# Patient Record
Sex: Male | Born: 1950 | Hispanic: Refuse to answer | Marital: Married | Smoking: Former smoker
Health system: Southern US, Community
[De-identification: ages and names within clinical notes are randomized; demographics above are authoritative.]

## PROBLEM LIST (undated history)

## (undated) DIAGNOSIS — E119 Type 2 diabetes mellitus without complications: Secondary | ICD-10-CM

## (undated) DIAGNOSIS — I10 Essential (primary) hypertension: Secondary | ICD-10-CM

## (undated) DIAGNOSIS — E785 Hyperlipidemia, unspecified: Secondary | ICD-10-CM

## (undated) DIAGNOSIS — M722 Plantar fascial fibromatosis: Secondary | ICD-10-CM

## (undated) HISTORY — DX: Plantar fascial fibromatosis: M72.2

## (undated) HISTORY — DX: Essential (primary) hypertension: I10

## (undated) HISTORY — DX: Hyperlipidemia, unspecified: E78.5

## (undated) HISTORY — DX: Type 2 diabetes mellitus without complications: E11.9

---

## 1995-08-11 HISTORY — PX: OTHER SURGICAL HISTORY: SHX169

## 1999-09-08 ENCOUNTER — Ambulatory Visit (HOSPITAL_BASED_OUTPATIENT_CLINIC_OR_DEPARTMENT_OTHER): Admission: RE | Admit: 1999-09-08 | Discharge: 1999-09-08 | Payer: Self-pay | Admitting: Surgery

## 2000-07-27 ENCOUNTER — Encounter: Admission: RE | Admit: 2000-07-27 | Discharge: 2000-08-09 | Payer: Self-pay | Admitting: Family Medicine

## 2000-08-10 HISTORY — PX: KNEE ARTHROSCOPY: SHX127

## 2000-08-20 ENCOUNTER — Encounter: Payer: Self-pay | Admitting: Family Medicine

## 2000-08-20 ENCOUNTER — Encounter: Admission: RE | Admit: 2000-08-20 | Discharge: 2000-08-20 | Payer: Self-pay | Admitting: Family Medicine

## 2001-09-21 ENCOUNTER — Ambulatory Visit (HOSPITAL_BASED_OUTPATIENT_CLINIC_OR_DEPARTMENT_OTHER): Admission: RE | Admit: 2001-09-21 | Discharge: 2001-09-21 | Payer: Self-pay | Admitting: Orthopedic Surgery

## 2002-08-10 HISTORY — PX: CARPAL TUNNEL RELEASE: SHX101

## 2003-11-06 ENCOUNTER — Encounter: Admission: RE | Admit: 2003-11-06 | Discharge: 2003-11-06 | Payer: Self-pay | Admitting: Gastroenterology

## 2003-11-13 ENCOUNTER — Encounter (INDEPENDENT_AMBULATORY_CARE_PROVIDER_SITE_OTHER): Payer: Self-pay | Admitting: Specialist

## 2003-11-13 ENCOUNTER — Ambulatory Visit (HOSPITAL_COMMUNITY): Admission: RE | Admit: 2003-11-13 | Discharge: 2003-11-13 | Payer: Self-pay | Admitting: Gastroenterology

## 2003-12-20 ENCOUNTER — Encounter: Admission: RE | Admit: 2003-12-20 | Discharge: 2003-12-20 | Payer: Self-pay | Admitting: Gastroenterology

## 2004-08-10 HISTORY — PX: BACK SURGERY: SHX140

## 2005-05-13 ENCOUNTER — Ambulatory Visit (HOSPITAL_COMMUNITY): Admission: RE | Admit: 2005-05-13 | Discharge: 2005-05-14 | Payer: Self-pay | Admitting: Specialist

## 2007-06-06 ENCOUNTER — Encounter: Admission: RE | Admit: 2007-06-06 | Discharge: 2007-06-06 | Payer: Self-pay | Admitting: Family Medicine

## 2008-06-05 ENCOUNTER — Encounter (INDEPENDENT_AMBULATORY_CARE_PROVIDER_SITE_OTHER): Payer: Self-pay | Admitting: *Deleted

## 2008-08-10 LAB — HM COLONOSCOPY: HM Colonoscopy: NORMAL

## 2009-06-20 ENCOUNTER — Ambulatory Visit: Payer: Self-pay | Admitting: Family Medicine

## 2009-06-20 DIAGNOSIS — M722 Plantar fascial fibromatosis: Secondary | ICD-10-CM

## 2009-06-20 DIAGNOSIS — I10 Essential (primary) hypertension: Secondary | ICD-10-CM

## 2009-06-20 HISTORY — DX: Essential (primary) hypertension: I10

## 2009-06-20 HISTORY — DX: Plantar fascial fibromatosis: M72.2

## 2010-04-08 ENCOUNTER — Encounter: Payer: Self-pay | Admitting: Family Medicine

## 2010-07-22 ENCOUNTER — Telehealth: Payer: Self-pay | Admitting: Family Medicine

## 2010-07-28 ENCOUNTER — Ambulatory Visit: Payer: Self-pay | Admitting: Family Medicine

## 2010-07-28 LAB — CONVERTED CEMR LAB
ALT: 16 units/L (ref 0–53)
AST: 18 units/L (ref 0–37)
Albumin: 3.8 g/dL (ref 3.5–5.2)
Alkaline Phosphatase: 64 units/L (ref 39–117)
BUN: 11 mg/dL (ref 6–23)
Basophils Absolute: 0.1 10*3/uL (ref 0.0–0.1)
Basophils Relative: 0.6 % (ref 0.0–3.0)
Bilirubin Urine: NEGATIVE
Bilirubin, Direct: 0.1 mg/dL (ref 0.0–0.3)
CO2: 28 meq/L (ref 19–32)
Calcium: 9.5 mg/dL (ref 8.4–10.5)
Chloride: 104 meq/L (ref 96–112)
Cholesterol: 133 mg/dL (ref 0–200)
Creatinine, Ser: 0.9 mg/dL (ref 0.4–1.5)
Eosinophils Absolute: 0.2 10*3/uL (ref 0.0–0.7)
Eosinophils Relative: 2.5 % (ref 0.0–5.0)
GFR calc non Af Amer: 93.28 mL/min (ref >60.00)
Glucose, Bld: 119 mg/dL — ABNORMAL HIGH (ref 70–99)
HCT: 45.8 % (ref 39.0–52.0)
HDL: 33.3 mg/dL — ABNORMAL LOW (ref >39.00)
Hemoglobin: 15.4 g/dL (ref 13.0–17.0)
Ketones, ur: NEGATIVE mg/dL
LDL Cholesterol: 71 mg/dL (ref 0–99)
Leukocytes, UA: NEGATIVE
Lymphocytes Relative: 24.4 % (ref 12.0–46.0)
Lymphs Abs: 2.3 10*3/uL (ref 0.7–4.0)
MCHC: 33.6 g/dL (ref 30.0–36.0)
MCV: 82 fL (ref 78.0–100.0)
Monocytes Absolute: 1 10*3/uL (ref 0.1–1.0)
Monocytes Relative: 10.3 % (ref 3.0–12.0)
Neutro Abs: 5.8 10*3/uL (ref 1.4–7.7)
Neutrophils Relative %: 62.2 % (ref 43.0–77.0)
Nitrite: NEGATIVE
PSA: 0.39 ng/mL (ref 0.10–4.00)
Platelets: 346 10*3/uL (ref 150.0–400.0)
Potassium: 5.2 meq/L — ABNORMAL HIGH (ref 3.5–5.1)
RBC: 5.59 M/uL (ref 4.22–5.81)
RDW: 14.6 % (ref 11.5–14.6)
Sodium: 140 meq/L (ref 135–145)
Specific Gravity, Urine: <=1.005 (ref 1.000–1.030)
TSH: 1.17 microintl units/mL (ref 0.35–5.50)
Total Bilirubin: 1 mg/dL (ref 0.3–1.2)
Total CHOL/HDL Ratio: 4
Total Protein, Urine: NEGATIVE mg/dL
Total Protein: 7.1 g/dL (ref 6.0–8.3)
Triglycerides: 146 mg/dL (ref 0.0–149.0)
Urine Glucose: NEGATIVE mg/dL
Urobilinogen, UA: 0.2 (ref 0.0–1.0)
VLDL: 29.2 mg/dL (ref 0.0–40.0)
WBC: 9.4 10*3/uL (ref 4.5–10.5)
pH: 6.5 (ref 5.0–8.0)

## 2010-08-10 LAB — HM DIABETES FOOT EXAM

## 2010-08-10 LAB — HM DIABETES EYE EXAM

## 2010-08-13 ENCOUNTER — Ambulatory Visit
Admission: RE | Admit: 2010-08-13 | Discharge: 2010-08-13 | Payer: Self-pay | Source: Home / Self Care | Attending: Family Medicine | Admitting: Family Medicine

## 2010-08-13 DIAGNOSIS — E119 Type 2 diabetes mellitus without complications: Secondary | ICD-10-CM | POA: Insufficient documentation

## 2010-08-13 DIAGNOSIS — E785 Hyperlipidemia, unspecified: Secondary | ICD-10-CM | POA: Insufficient documentation

## 2010-08-13 DIAGNOSIS — D485 Neoplasm of uncertain behavior of skin: Secondary | ICD-10-CM | POA: Insufficient documentation

## 2010-08-26 ENCOUNTER — Encounter: Payer: Self-pay | Admitting: Family Medicine

## 2010-08-26 DIAGNOSIS — E785 Hyperlipidemia, unspecified: Secondary | ICD-10-CM | POA: Insufficient documentation

## 2010-08-26 DIAGNOSIS — E119 Type 2 diabetes mellitus without complications: Secondary | ICD-10-CM | POA: Insufficient documentation

## 2010-09-06 ENCOUNTER — Encounter: Payer: Self-pay | Admitting: Family Medicine

## 2010-09-11 ENCOUNTER — Ambulatory Visit (INDEPENDENT_AMBULATORY_CARE_PROVIDER_SITE_OTHER): Payer: 59 | Admitting: Family Medicine

## 2010-09-11 ENCOUNTER — Encounter: Payer: Self-pay | Admitting: Family Medicine

## 2010-09-11 ENCOUNTER — Ambulatory Visit: Admit: 2010-09-11 | Payer: Self-pay | Admitting: Family Medicine

## 2010-09-11 VITALS — BP 126/74 | HR 77 | Temp 98.3°F | Ht 73.0 in | Wt 253.0 lb

## 2010-09-11 DIAGNOSIS — E119 Type 2 diabetes mellitus without complications: Secondary | ICD-10-CM

## 2010-09-11 DIAGNOSIS — D239 Other benign neoplasm of skin, unspecified: Secondary | ICD-10-CM

## 2010-09-11 DIAGNOSIS — D229 Melanocytic nevi, unspecified: Secondary | ICD-10-CM

## 2010-09-11 NOTE — Progress Notes (Signed)
Summary: Avandia no longer available ar retail pharmacies  Phone Note From Pharmacy   [facility]  [address] Call For: Christopher Navarro  Summary of Call: Fax from pharmacy "Avandia is no longer available at retail pharmacies.  Patient asking about switching or making other arrangements". Initial call taken by: Sid Falcon LPN,  July 22, 2010 3:03 PM  Follow-up for Phone Call        Change to Actos 15 mg one by mouth once daily Refill OK to 2 months but pt is overdue for follow up  Follow-up by: Evelena Peat MD,  July 22, 2010 7:25 PM  Additional Follow-up for Phone Call Additional follow up Details #1::        Rx sent, pt informed needs return OV Additional Follow-up by: Sid Falcon LPN,  July 23, 2010 12:13 PM    New/Updated Medications: ACTOS 15 MG TABS (PIOGLITAZONE HCL) once daily Prescriptions: ACTOS 15 MG TABS (PIOGLITAZONE HCL) once daily  #30 x 2   Entered by:   Sid Falcon LPN   Authorized by:   Evelena Peat MD   Signed by:   Sid Falcon LPN on 16/05/9603   Method used:   [facility]        [facility]  [address] [facility]       [address]       [place], [place]  [address]       Ph: [phone] or [phone]       Fax: [phone]   RxID:   [id number]

## 2010-09-11 NOTE — Patient Instructions (Signed)
Keep wound dry for the first 24 hours then clean daily with soap and water for one week. Apply topical antibiotic daily for 3-4 days. Keep covered with clean dressing for 4-5 days. Follow up promptly for any signs of infection such as redness, warmth, pain, or drainage.  

## 2010-09-11 NOTE — Progress Notes (Addendum)
Pt here for atypical mole excision upper back.  No recent itching or bleeding. This was noted on recent exam.  Pt does not have personal hx of skin cancer But brother did have what sounds like severe dysplasia vs melanoma.  Pt also has hx of diabetes and needs A1C today.  This was overlooked and not Done on recent labs from CPE.  PMH, SH, AND FH reviewed.  ROS  No urine frequency or increased thirst.  PE  Pt alert and no apparent distress. Chest- clear to auscultation. Heart RRR without murmur. Skin-  Mid upper thoracic back  Nevus 6 mm diameter with mostly brown color with some Mild asymmetry and slightly irreg border.  Assessment. Atypical nevus mid upper back Plan.  Discussed risk and benefits of shave excision of nevus with patient. Patient consented. PET scan of alcohol. Anesthesia 1% Xylocaine with epinephrine.           Shave biopsy with #15 blade with minimal bleeding. Contrast with Drysol. Antibiotic and dressing applied. Wound care instruction given. Specimen sent to pathology for further evaluation

## 2010-09-11 NOTE — Letter (Signed)
Summary: [facility]  [facility]   Imported By: Sherian Rein 04/16/2010 10:40:35  _____________________________________________________________________  External Attachment:    Type:   Image     Comment:   External Document

## 2010-09-11 NOTE — Assessment & Plan Note (Signed)
Summary: cpx/cjr   Vital Signs:  Patient profile:   60 year old male Height:      73 inches Weight:      252 pounds BMI:     33.37 Temp:     98.3 degrees F oral Pulse rate:   88 / minute Pulse rhythm:   regular Resp:     12 per minute BP sitting:   120 / 82  (left arm) Cuff size:   large  Vitals Entered By: Sid Falcon LPN (August 13, 2010 11:44 AM)  Nutrition Counseling: Patient's BMI is greater than 25 and therefore counseled on weight management options.  History of Present Illness: Here for CPE.  Has just recently started exercising. Weight gain in recent years. PMH, SH, and FH reviewed.  Diabetes not checking frequently.  No symptoms of hyperglycemia. A1C not done with recent labs.     Diabetes Management History:      He has not been enrolled in the "Diabetic Education Program".  He states understanding of dietary principles but he is not following the appropriate diet.  No sensory loss is reported.  Self foot exams are being performed.  He is not checking home blood sugars.  He says that he is exercising.        Hypoglycemic symptoms are not occurring.  No hyperglycemic symptoms are reported.        There are no symptoms to suggest diabetic complications.  No changes have been made to his treatment plan since last visit.    Hypertension History:      He denies headache, chest pain, palpitations, dyspnea with exertion, orthopnea, PND, peripheral edema, visual symptoms, neurologic problems, and syncope.  He notes no problems with any antihypertensive medication side effects.        Positive major cardiovascular risk factors include male age 24 years old or older, diabetes, hyperlipidemia, and hypertension.     Preventive Screening-Counseling & Management  Caffeine-Diet-Exercise     Does Patient Exercise: yes  Clinical Review Panels:  Prevention   Last Colonoscopy:  normal (08/10/2008)   Last PSA:  0.39 (07/28/2010)  Immunizations   Last Tetanus Booster:   Historical (06/10/2010)   Last Flu Vaccine:  Historical (06/10/2010)  Lipid Management   Cholesterol:  133 (07/28/2010)   LDL (bad choesterol):  71 (07/28/2010)   HDL (good cholesterol):  33.30 (07/28/2010)  Diabetes Management   Creatinine:  0.9 (07/28/2010)   Last Dilated Eye Exam:  normal (08/13/2010)   Last Foot Exam:  yes (08/13/2010)   Last Flu Vaccine:  Historical (06/10/2010)  CBC   WBC:  9.4 (07/28/2010)   RBC:  5.59 (07/28/2010)   Hgb:  15.4 (07/28/2010)   Hct:  45.8 (07/28/2010)   Platelets:  346.0 (07/28/2010)   MCV  82.0 (07/28/2010)   MCHC  33.6 (07/28/2010)   RDW  14.6 (07/28/2010)   PMN:  62.2 (07/28/2010)   Lymphs:  24.4 (07/28/2010)   Monos:  10.3 (07/28/2010)   Eosinophils:  2.5 (07/28/2010)   Basophil:  0.6 (07/28/2010)  Complete Metabolic Panel   Glucose:  119 (07/28/2010)   Sodium:  140 (07/28/2010)   Potassium:  5.2 (07/28/2010)   Chloride:  104 (07/28/2010)   CO2:  28 (07/28/2010)   BUN:  11 (07/28/2010)   Creatinine:  0.9 (07/28/2010)   Albumin:  3.8 (07/28/2010)   Total Protein:  7.1 (07/28/2010)   Calcium:  9.5 (07/28/2010)   Total Bili:  1.0 (07/28/2010)   Alk Phos:  64 (07/28/2010)  SGPT (ALT):  16 (07/28/2010)   SGOT (AST):  18 (07/28/2010)   Allergies (verified): No Known Drug Allergies  Past History:  Past Medical History: Last updated: 06/20/2009 Diabetes Hypertension  Past Surgical History: Last updated: 06/20/2009 Knee scope 2002 Bilateral CTS 2004 Ablasion (WPW) 1997 Back 2006  Family History: Last updated: 08/13/2010 Family History of Arthritis Family History High cholesterol Family History Hypertension  mother Family History of Stroke mother 55  Social History: Last updated: 06/20/2009 Retired  Lexicographer Married Alcohol use-no Past smoker, quit PMH-FH-SH reviewed for relevance  Family History: Family History of Arthritis Family History High cholesterol Family History Hypertension   mother Family History of Stroke mother 22  Social History: Does Patient Exercise:  yes  Review of Systems  The patient denies anorexia, fever, weight loss, vision loss, decreased hearing, hoarseness, chest pain, syncope, dyspnea on exertion, peripheral edema, prolonged cough, headaches, hemoptysis, abdominal pain, melena, hematochezia, severe indigestion/heartburn, hematuria, incontinence, genital sores, muscle weakness, suspicious skin lesions, transient blindness, difficulty walking, depression, unusual weight change, abnormal bleeding, enlarged lymph nodes, and testicular masses.    Physical Exam  General:  alert, well-developed, and well-nourished.   Head:  normocephalic and atraumatic.   Eyes:  pupils equal, pupils round, and pupils reactive to light.   Ears:  External ear exam shows no significant lesions or deformities.  Otoscopic examination reveals clear canals, tympanic membranes are intact bilaterally without bulging, retraction, inflammation or discharge. Hearing is grossly normal bilaterally. Mouth:  Oral mucosa and oropharynx without lesions or exudates.  Teeth in good repair. Neck:  No deformities, masses, or tenderness noted. Lungs:  Normal respiratory effort, chest expands symmetrically. Lungs are clear to auscultation, no crackles or wheezes. Heart:  Normal rate and regular rhythm. S1 and S2 normal without gallop, murmur, click, rub or other extra sounds. Abdomen:  Bowel sounds positive,abdomen soft and non-tender without masses, organomegaly or hernias noted. Extremities:  No clubbing, cyanosis, edema, or deformity noted with normal full range of motion of all joints.   Neurologic:  alert & oriented X3, cranial nerves II-XII intact, and strength normal in all extremities.   Skin:  no rashes  R upper back nodular lesion with well demarcated base about 6mm diameter with hyperkeratotic raised surface.  Cervical Nodes:  No lymphadenopathy noted Psych:  Oriented X3, good eye  contact, not anxious appearing, and not depressed appearing.    Diabetes Management Exam:    Foot Exam (with socks and/or shoes not present):       Sensory-Pinprick/Light touch:          Left medial foot (L-4): normal          Left dorsal foot (L-5): normal          Left lateral foot (S-1): normal          Right medial foot (L-4): normal          Right dorsal foot (L-5): normal          Right lateral foot (S-1): normal       Sensory-Monofilament:          Left foot: normal          Right foot: normal       Inspection:          Left foot: normal          Right foot: normal       Nails:          Left foot: normal  Right foot: normal    Eye Exam:       Eye Exam done here today          Results: normal   Impression & Recommendations:  Problem # 1:  Preventive Health Care (ICD-V70.0)  Problem # 2:  HYPERTENSION (ICD-401.9)  His updated medication list for this problem includes:    Tarka 4-240 Mg Cr-tabs (Trandolapril-verapamil hcl) ..... Once daily  Problem # 3:  DIABETES MELLITUS, TYPE II (ICD-250.00) diabetes discussed in some detail.  Pt would like to come off Actos and we have suggested weight loss, repeat A1C at follow up in one month, and consider titrate metformin then if stopping Actos. His updated medication list for this problem includes:    Tarka 4-240 Mg Cr-tabs (Trandolapril-verapamil hcl) ..... Once daily    Actos 15 Mg Tabs (Pioglitazone hcl) ..... Once daily    Metformin Hcl 500 Mg Tabs (Metformin hcl) ..... Once daily  Problem # 4:  DYSLIPIDEMIA (ICD-272.4)  His updated medication list for this problem includes:    Simvastatin 40 Mg Tabs (Simvastatin) ..... Once daily  Problem # 5:  NEOPLASM OF UNCERTAIN BEHAVIOR OF SKIN (ICD-238.2) Assessment: New ?keratoacanthoma vs seb keratosis vs squamous cell ca upper back.  Pt to return for excision.  Complete Medication List: 1)  Simvastatin 40 Mg Tabs (Simvastatin) .... Once daily 2)  Tarka 4-240 Mg  Cr-tabs (Trandolapril-verapamil hcl) .... Once daily 3)  Actos 15 Mg Tabs (Pioglitazone hcl) .... Once daily 4)  Metformin Hcl 500 Mg Tabs (Metformin hcl) .... Once daily 5)  Ibuprofen 200 Mg Tabs (Ibuprofen) .... As needed  Diabetes Management Assessment/Plan:      His blood pressure goal is < 140/90.    Hypertension Assessment/Plan:      The patient's hypertensive risk group is category C: Target organ damage and/or diabetes.  His calculated 10 year risk of coronary heart disease is 14 %.  Today's blood pressure is 120/82.  His blood pressure goal is < 140/90.  Patient Instructions: 1)  Schedule follow up in one month for skin lesion excision. 2)  It is important that you exercise reguarly at least 20 minutes 5 times a week. If you develop chest pain, have severe difficulty breathing, or feel very tired, stop exercising immediately and seek medical attention.  3)  You need to lose weight. Consider a lower calorie diet and regular exercise.    Orders Added: 1)  Est. Patient 40-64 years [99396] 2)  Est. Patient Level III [[address]]   Immunization History:  Influenza Immunization History:    Influenza:  historical (06/10/2010)   Immunization History:  Influenza Immunization History:    Influenza:  Historical (06/10/2010)  Preventive Care Screening  Last Tetanus Booster:    Date:  06/10/2010    Results:  Historical

## 2010-09-15 ENCOUNTER — Telehealth: Payer: Self-pay | Admitting: Family Medicine

## 2010-09-15 DIAGNOSIS — D239 Other benign neoplasm of skin, unspecified: Secondary | ICD-10-CM

## 2010-09-15 NOTE — Telephone Encounter (Signed)
Pt notified of dysplastic nevus and rec derm referral.

## 2010-10-23 ENCOUNTER — Other Ambulatory Visit: Payer: Self-pay | Admitting: Family Medicine

## 2010-10-27 ENCOUNTER — Telehealth: Payer: Self-pay | Admitting: *Deleted

## 2010-10-27 MED ORDER — TRANDOLAPRIL 4 MG PO TABS: 4.0000 mg | ORAL_TABLET | Freq: Every day | ORAL | Status: DC

## 2010-10-27 MED ORDER — VERAPAMIL HCL ER 240 MG PO TBCR: 240.0000 mg | EXTENDED_RELEASE_TABLET | Freq: Every day | ORAL | Status: DC

## 2010-10-27 NOTE — Telephone Encounter (Signed)
Yes.  I would fill as separate prescription.

## 2010-10-27 NOTE — Telephone Encounter (Signed)
[facility] [place].  Generic combo Trandolapril-Verapamil not available, pt does not want Tarka. Is it OK to fill as separate Rx? Pt had CPX in Jan 2012

## 2010-10-28 ENCOUNTER — Other Ambulatory Visit: Payer: 59

## 2010-12-26 NOTE — Op Note (Signed)
[facility]  Patient:    Christopher Navarro, Christopher Navarro Visit Number: [id number] MRN: [id number]          Service Type: DSU Location: [facility] Attending Physician:  Colbert Ewing Dictated by:   Loreta Ave, M.D. Proc. Date: 09/21/01 Admit Date:  09/21/2001                             Operative Report  PREOPERATIVE DIAGNOSIS:  Traumatic chondromalacia of patella, left knee.  POSTOPERATIVE DIAGNOSIS:  Traumatic chondromalacia of patella, left knee with some focal grade 3 chondral changes of anterior aspect of medial femoral condyle, lateral femoral condyle, and medial patellar facet.  Reactive synovitis.  PROCEDURE:  Left knee exam under anesthesia, arthroscopy, chondroplasty of patella, medial and lateral femoral condyles, partial synovectomy.  SURGEON:  Loreta Ave, M.D.  ASSISTANT:  Arlys John D. Petrarca, P.A.-C.  ANESTHESIA:  Local with knee block and sedation.  SPECIMENS:  None.  CULTURES:  None.  COMPLICATIONS:  None.  DRESSING:  Self-compressive.  DESCRIPTION OF PROCEDURE:  The patient was brought to the operating room and placed on the operating table in the supine position.  After adequate anesthesia had been obtained, the left knee examined.  Full motion and good stability.  Good patellofemoral tracking.  Tourniquet and leg holder applied. Leg prepped and draped in the usual sterile fashion.  Three portals created, one superolateral, one each medial and lateral parapatellar.  Inflow catheter introduced and the standard arthroscope introduced.  Knee inspected.  Focal grade 3 chondral lesion of patella, medial facet, and most of the facet. Treatment with a chondroplasty to a stable surface.  Reactive synovitis about this was debrided.  Some focal more smaller grade 3 changes in the anterior aspect, medial and lateral femoral condyle, debrided.  Femoral trochlea looked good.  Excellent patellofemoral tracking.  The medial and lateral  meniscus as well as the cruciate ligament is normal.  All chondral loose bodies were removed.  All recess examined.  No other significant findings appreciated. Instruments and fluid removed.  Portals of the knee injected with Marcaine. Portals closed with 4-0 nylon.  Sterile compressive dressing applied.  Anesthesia reversed and brought to the recovery room.  Tolerated surgery well with no complications. Dictated by:   Loreta Ave, M.D. Attending Physician:  Colbert Ewing DD:  09/21/01 TD:  09/22/01 Job: [id number]

## 2010-12-26 NOTE — Op Note (Signed)
NAMEWETZEL, MEESTER NO.:  [id number]   MEDICAL RECORD NO.:  [id number]          PATIENT TYPE:  OIB   LOCATION:  0098                         FACILITY:  [facility]   PHYSICIAN:  Jene Every, M.D.    DATE OF BIRTH:  Apr 23, 1951   DATE OF PROCEDURE:  DATE OF DISCHARGE:                                 OPERATIVE REPORT   PREOPERATIVE DIAGNOSES:  Spinal stenosis L4-5 right.   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   PROCEDURE:  Lateral recess decompression L4-5, foraminotomy of L5, lysis of  epidural venous plexus.   ANESTHESIA:  General.   ASSISTANT:  Roma Schanz, P.A.   BRIEF HISTORY:  A 60 year old with right lower extremity radiculopathy, L5  nerve root distribution. Had lateral recess stenosis due to disk  degeneration, ligamentum flavum hypertrophy, EHL weakness, positive neural  tension signs. __________ epidural steroid injection. EHL weakness, L5  radiculopathy. His MRI indicating disk protrusion and lateral recess  stenosis, felt he was symptomatic secondary to positional lateral recess  stenosis. Operative intervention was indicated for decompressing the L5  nerve root, possible microdiskectomy. The risks and benefits were discussed  including bleeding, infection, injury to vascular structures, CSF leakage,  epidural fibrosis, adjacent segment disease, need for fusion in the future,  anesthetic complications, etc.   TECHNIQUE:  The patient in supine position and after the induction of an  adequate level of general anesthesia, 1 gram Kefzol, placed prone on the  Andrews frame, all bony prominences well padded. Two 18 gauge spinal needles  were utilized to localize the L4-5 interspace. This was confirmed on x-ray.  An incision was made from the spinous process of 4 of 5, subcutaneous tissue  was dissected, electrocautery was utilized to achieve hemostasis. The  dorsolumbar fascia was identified and divided in  line with the skin  incision. The paraspinous muscle elevated from the lamina of 4 and 5,  operating microscope was draped and brought onto the surgical field. Again a  confirmatory radiography and Penfield 4 in the intralaminar space confirmed  the L4-5 disk space. Ligamentum flavum detached from the cephalad edge of L5  utilizing a straight curette and caudad edge of 4 utilizing a 2 and a 3 mm  Kerrison performing hemilaminotomy to detach the ligamentum flavum  attachment. There was hypertrophic ligamentum flavum compressing the lateral  recess. Hemilaminotomy of the cephalad edge of 5 was performed with a 2 and  a 3 mm Kerrison, enlarged into an L5 foraminotomy. The ligamentum flavum was  then removed from the interspace and noted laterally was again ligamentum  flavum hypertrophy, facet hypertrophy and a vascular lesion epidural venous  hypertension compressing the 5 root. After the ligamentum flavum was  removed, the thecal sac and the nerve root was gently mobilized medially. We  lysed multiple epidural veins and adhesions at the L5 nerve root. I  mobilized the nerve root easily at least a centimeter medial to the pedicle  without difficulty. The ligamentum flavum also was removed from the lateral  recess to the medial border of the pedicle  with a 2 mm Kerrison. I examined  the disk. There was no evidence of a disk herniation examined beneath the  thecal sac. In the axilla of the root down into the foramen and out of the  foramen of 4 with no evidence of disk herniation, residual compression upon  the 5 root. A hockey stick probe placed in the foramen of 5 and 4 found to  be widely patent. The disk space was copiously irrigated with antibiotic  irrigation. Inspection revealed no evidence of CSF leakage or active  bleeding. I placed bone wax on the _________ cancellous surfaces. The disk  space was again copiously irrigated with antibiotic irrigation, thrombin  soaked Gelfoam was  placed in the laminotomy defect. The McCullough retractor  was removed, paraspinous muscle inspected with no evidence of active  bleeding. The dorsolumbar fascia reapproximated with #1 Vicryl interrupted  figure-of-eight sutures. The subcutaneous tissue reapproximated with 2-0  Vicryl simple sutures and #0 Vicryl simple sutures. The skin was  reapproximated with Prolene. The wound reinforced with Steri-Strips, sterile  dressing applied, placed supine on the hospital bed, extubated without  difficulty and transported to the recovery room in satisfactory condition.   The patient tolerated the procedure well with no complications.      Jene Every, M.D.  Electronically Signed     JB/MEDQ  D:  05/13/2005  T:  05/13/2005  Job:  [id number]

## 2010-12-26 NOTE — Op Note (Signed)
NAME:  Christopher Navarro, Christopher Navarro                           ACCOUNT NO.:  [id number]   MEDICAL RECORD NO.:  [id number]                   PATIENT TYPE:  AMB   LOCATION:  ENDO                                 FACILITY:  [facility]   PHYSICIAN:  Petra Kuba, M.D.                 DATE OF BIRTH:  [birth year]   DATE OF PROCEDURE:  11/13/2003  DATE OF DISCHARGE:                                 OPERATIVE REPORT   PROCEDURE:  Colonoscopy with biopsy.   INDICATIONS:  Abdominal pain.  Patient due for colonic screening.  Nondiagnostic workup to date.  Consent was signed after risks, benefits,  methods, and options thoroughly discussed in the office.   MEDICINES USED:  Demerol 50 mg, Versed 6 mg.   PROCEDURE:  Rectal inspection was pertinent for external hemorrhoids, small.  Digital exam was negative.  The video colonoscope was inserted, easily  advanced around the colon to the cecum.  This did require some abdominal  pressure but no position changes.  No obvious abnormality was seen on  insertion.  The cecum was identified by the appendiceal orifice and the  ileocecal valve.  In fact, the scope was inserted a short way into the  terminal ileum, which was normal.  There was a rare patch or erythema there.  A few random biopsies of the TI were obtained and put in the first  container.  The scope was slowly withdrawn.  The prep was adequate.  There  was some liquid stool that required washing and suctioning.  On slow  withdrawal through the colon, again there was a rare patch of erythema and  random colon biopsies were obtained and put in the second container.  There  was also a rare early left-sided diverticulum and one tiny midsigmoid polyp,  which was cold biopsied and put in the third container.  The scope was  slowly withdrawn back to the rectum.  No additional findings were seen.  Anorectal pull-through and retroflexion confirmed some small hemorrhoids.  The scope was reinserted a short way up the left side  of the colon, air was  suctioned, the scope removed.  The patient tolerated the procedure well.  There was no obvious immediate complication.   ENDOSCOPIC DIAGNOSES:  1. Internal-external small hemorrhoids.  2. Rare early left-sided diverticula.  3. A tiny sigmoid polyp, cold biopsied.  4. Minimal erythema patches, questionable due to prep, status post random     biopsies throughout.  5. Otherwise within normal limits to the terminal ileum, status post biopsy.   PLAN:  Await pathology.  Consider small bowel follow-through next or retreat  with broader-spectrum antibiotics.  In the meantime, still need [facility]  stool studies to bring our chart up to date.  Will decide further workup and  plans pending symptoms when we talk to him and review the biopsies.  Petra Kuba, M.D.    MEM/MEDQ  D:  11/13/2003  T:  11/14/2003  Job:  [id number]   cc:   [facility]

## 2011-01-12 ENCOUNTER — Encounter: Payer: Self-pay | Admitting: Family Medicine

## 2011-01-12 ENCOUNTER — Ambulatory Visit (INDEPENDENT_AMBULATORY_CARE_PROVIDER_SITE_OTHER): Payer: 59 | Admitting: Family Medicine

## 2011-01-12 VITALS — BP 130/78 | Temp 98.0°F | Wt 254.0 lb

## 2011-01-12 DIAGNOSIS — H109 Unspecified conjunctivitis: Secondary | ICD-10-CM

## 2011-01-12 DIAGNOSIS — M545 Low back pain: Secondary | ICD-10-CM

## 2011-01-12 MED ORDER — HYDROCODONE-ACETAMINOPHEN 5-325 MG PO TABS: 1.0000 | ORAL_TABLET | Freq: Four times a day (QID) | ORAL | Status: AC | PRN

## 2011-01-12 MED ORDER — DIAZEPAM 5 MG PO TABS: 5.0000 mg | ORAL_TABLET | Freq: Three times a day (TID) | ORAL | Status: AC | PRN

## 2011-01-12 MED ORDER — TOBRAMYCIN 0.3 % OP SOLN: 1.0000 [drp] | OPHTHALMIC | Status: AC

## 2011-01-12 NOTE — Progress Notes (Signed)
  Subjective:    Patient ID: Christopher Navarro, male    DOB: November 01, 1950, 60 y.o.   MRN: [id number]  HPI Patient seen with low back pain. Onset a few weeks ago and aggravated over the weekend with some yardwork. Pain is lower lumbar region. No radiculopathy symptoms. No numbness or weakness. Tried heat and Vpltaren cream without improvement. Took some sort of left over pain medicine with mild relief. Has taken Advil without much relief. History of similar back problems in the past. He's tried Flexeril and Robaxin in the past without much improvement. Sensation of muscle spasm intermittently. Poor sleep quality.  Intermittent right eye drainage and matting for the past 2 weeks. No blurred vision. No eye pain. No left eye symptoms No eye injury.  No contact use.     Review of Systems  Constitutional: Negative for fever, chills, appetite change and unexpected weight change.  Cardiovascular: Negative for chest pain and leg swelling.  Gastrointestinal: Negative for abdominal pain.  Genitourinary: Negative for dysuria.  Musculoskeletal: Positive for back pain. Negative for myalgias, arthralgias and gait problem.  Skin: Negative for rash.  Neurological: Negative for weakness and numbness.  Hematological: Negative for adenopathy. Does not bruise/bleed easily.       Objective:   Physical Exam  Constitutional: He appears well-developed and well-nourished. No distress.  HENT:  Right Ear: External ear normal.  Left Ear: External ear normal.  Eyes:       Right conjunctiva slightly erythematous compared to the left. No purulent drainage. Pupils equal round reactive to light. No corneal abnormalities noted.  Cardiovascular: Normal rate and regular rhythm.   Pulmonary/Chest: Effort normal and breath sounds normal. No respiratory distress. He has no wheezes. He has no rales.  Musculoskeletal: He exhibits no edema.       Straight leg raise is negative bilaterally. No peripheral edema.  Neurological:   Deep tendon reflexes are 1+ and symmetric knee and ankle. No strength deficits          Assessment & Plan:  #1 low back pain. Suspect musculoskeletal origin. Short-term use of diazepam 5 mg each bedtime and hydrocodone 5 mg every 8 hours as needed for severe pain. Recommend extension stretches walking as tolerated.  Follow up in 2 weeks if no better #2 conjunctivitis right eye tobramycin eye drops 2 drops right eye every 4 hours while awake and call in 5 days if no better

## 2011-01-29 ENCOUNTER — Telehealth: Payer: Self-pay | Admitting: Cardiology

## 2011-01-29 NOTE — Telephone Encounter (Signed)
Fax: [phone] OV, STRESS, EKG

## 2011-02-12 ENCOUNTER — Emergency Department (HOSPITAL_COMMUNITY): Payer: 59

## 2011-02-12 ENCOUNTER — Inpatient Hospital Stay (HOSPITAL_COMMUNITY)
Admission: EM | Admit: 2011-02-12 | Discharge: 2011-02-18 | DRG: 491 | Disposition: A | Discharge status: Home Health | Payer: 59 | Source: Ambulatory Visit | Attending: Neurological Surgery | Admitting: Neurological Surgery

## 2011-02-12 DIAGNOSIS — M519 Unspecified thoracic, thoracolumbar and lumbosacral intervertebral disc disorder: Secondary | ICD-10-CM | POA: Diagnosis present

## 2011-02-12 DIAGNOSIS — I456 Pre-excitation syndrome: Secondary | ICD-10-CM | POA: Diagnosis present

## 2011-02-12 DIAGNOSIS — M51379 Other intervertebral disc degeneration, lumbosacral region without mention of lumbar back pain or lower extremity pain: Secondary | ICD-10-CM | POA: Diagnosis present

## 2011-02-12 DIAGNOSIS — M4640 Discitis, unspecified, site unspecified: Secondary | ICD-10-CM | POA: Insufficient documentation

## 2011-02-12 DIAGNOSIS — E785 Hyperlipidemia, unspecified: Secondary | ICD-10-CM | POA: Diagnosis present

## 2011-02-12 DIAGNOSIS — E119 Type 2 diabetes mellitus without complications: Secondary | ICD-10-CM | POA: Diagnosis present

## 2011-02-12 DIAGNOSIS — F411 Generalized anxiety disorder: Secondary | ICD-10-CM | POA: Diagnosis present

## 2011-02-12 DIAGNOSIS — M5137 Other intervertebral disc degeneration, lumbosacral region: Secondary | ICD-10-CM | POA: Diagnosis present

## 2011-02-12 DIAGNOSIS — I10 Essential (primary) hypertension: Secondary | ICD-10-CM | POA: Diagnosis present

## 2011-02-12 DIAGNOSIS — M5126 Other intervertebral disc displacement, lumbar region: Principal | ICD-10-CM | POA: Diagnosis present

## 2011-02-12 LAB — URINALYSIS, ROUTINE W REFLEX MICROSCOPIC
Bilirubin Urine: NEGATIVE
Glucose, UA: NEGATIVE mg/dL
Hgb urine dipstick: NEGATIVE
Ketones, ur: NEGATIVE mg/dL
Leukocytes, UA: NEGATIVE
Nitrite: NEGATIVE
Protein, ur: NEGATIVE mg/dL
Specific Gravity, Urine: 1.017 (ref 1.005–1.030)
Urobilinogen, UA: 2 mg/dL — ABNORMAL HIGH (ref 0.0–1.0)
pH: 6.5 (ref 5.0–8.0)

## 2011-02-12 LAB — GLUCOSE, CAPILLARY
Glucose-Capillary: 112 mg/dL — ABNORMAL HIGH (ref 70–99)
Glucose-Capillary: 116 mg/dL — ABNORMAL HIGH (ref 70–99)
Glucose-Capillary: 143 mg/dL — ABNORMAL HIGH (ref 70–99)

## 2011-02-12 LAB — CBC
HCT: 45.8 % (ref 39.0–52.0)
Hemoglobin: 15.6 g/dL (ref 13.0–17.0)
MCH: 27.1 pg (ref 26.0–34.0)
MCHC: 34.1 g/dL (ref 30.0–36.0)
MCV: 79.7 fL (ref 78.0–100.0)
Platelets: 314 10*3/uL (ref 150–400)
RBC: 5.75 MIL/uL (ref 4.22–5.81)
RDW: 14.4 % (ref 11.5–15.5)
WBC: 12.3 10*3/uL — ABNORMAL HIGH (ref 4.0–10.5)

## 2011-02-12 LAB — BASIC METABOLIC PANEL
BUN: 12 mg/dL (ref 6–23)
CO2: 30 mEq/L (ref 19–32)
Calcium: 9.2 mg/dL (ref 8.4–10.5)
Chloride: 94 mEq/L — ABNORMAL LOW (ref 96–112)
Creatinine, Ser: 0.87 mg/dL (ref 0.50–1.35)
GFR calc Af Amer: >60 mL/min (ref >60)
GFR calc non Af Amer: >60 mL/min (ref >60)
Glucose, Bld: 135 mg/dL — ABNORMAL HIGH (ref 70–99)
Potassium: 4.3 mEq/L (ref 3.5–5.1)
Sodium: 133 mEq/L — ABNORMAL LOW (ref 135–145)

## 2011-02-12 LAB — GRAM STAIN

## 2011-02-12 LAB — C-REACTIVE PROTEIN: CRP: 5.6 mg/dL — ABNORMAL HIGH (ref <0.6)

## 2011-02-12 LAB — DIFFERENTIAL
Basophils Absolute: 0 10*3/uL (ref 0.0–0.1)
Basophils Relative: 0 % (ref 0–1)
Eosinophils Absolute: 0.2 10*3/uL (ref 0.0–0.7)
Eosinophils Relative: 2 % (ref 0–5)
Lymphocytes Relative: 21 % (ref 12–46)
Lymphs Abs: 2.6 10*3/uL (ref 0.7–4.0)
Monocytes Absolute: 1.4 10*3/uL — ABNORMAL HIGH (ref 0.1–1.0)
Monocytes Relative: 12 % (ref 3–12)
Neutro Abs: 8 10*3/uL — ABNORMAL HIGH (ref 1.7–7.7)
Neutrophils Relative %: 66 % (ref 43–77)

## 2011-02-12 LAB — SEDIMENTATION RATE: Sed Rate: 11 mm/hr (ref 0–16)

## 2011-02-12 MED ORDER — GADOBENATE DIMEGLUMINE 529 MG/ML IV SOLN: 20.0000 mL | Freq: Once | INTRAVENOUS | Status: DC

## 2011-02-13 LAB — GLUCOSE, CAPILLARY
Glucose-Capillary: 154 mg/dL — ABNORMAL HIGH (ref 70–99)
Glucose-Capillary: 124 mg/dL — ABNORMAL HIGH (ref 70–99)
Glucose-Capillary: 114 mg/dL — ABNORMAL HIGH (ref 70–99)

## 2011-02-14 LAB — WOUND CULTURE: Culture: NO GROWTH

## 2011-02-14 LAB — GLUCOSE, CAPILLARY
Glucose-Capillary: 107 mg/dL — ABNORMAL HIGH (ref 70–99)
Glucose-Capillary: 134 mg/dL — ABNORMAL HIGH (ref 70–99)

## 2011-02-15 LAB — TISSUE CULTURE

## 2011-02-15 LAB — GLUCOSE, CAPILLARY
Glucose-Capillary: 138 mg/dL — ABNORMAL HIGH (ref 70–99)
Glucose-Capillary: 115 mg/dL — ABNORMAL HIGH (ref 70–99)

## 2011-02-15 LAB — VANCOMYCIN, RANDOM: Vancomycin Rm: 6.4 ug/mL

## 2011-02-16 LAB — GLUCOSE, CAPILLARY
Glucose-Capillary: 135 mg/dL — ABNORMAL HIGH (ref 70–99)
Glucose-Capillary: 108 mg/dL — ABNORMAL HIGH (ref 70–99)
Glucose-Capillary: 122 mg/dL — ABNORMAL HIGH (ref 70–99)

## 2011-02-16 LAB — DIFFERENTIAL
Basophils Absolute: 0 10*3/uL (ref 0.0–0.1)
Basophils Relative: 0 % (ref 0–1)
Eosinophils Absolute: 0.2 10*3/uL (ref 0.0–0.7)
Monocytes Absolute: 1.6 10*3/uL — ABNORMAL HIGH (ref 0.1–1.0)
Monocytes Relative: 17 % — ABNORMAL HIGH (ref 3–12)
Neutro Abs: 6.2 10*3/uL (ref 1.7–7.7)
Neutrophils Relative %: 67 % (ref 43–77)

## 2011-02-16 LAB — PROCALCITONIN: Procalcitonin: <0.1 ng/mL

## 2011-02-16 LAB — CBC
MCH: 26.4 pg (ref 26.0–34.0)
MCHC: 33 g/dL (ref 30.0–36.0)
Platelets: 273 10*3/uL (ref 150–400)
RBC: 4.88 MIL/uL (ref 4.22–5.81)

## 2011-02-17 ENCOUNTER — Telehealth: Payer: Self-pay | Admitting: Family Medicine

## 2011-02-17 ENCOUNTER — Inpatient Hospital Stay (HOSPITAL_COMMUNITY): Payer: 59

## 2011-02-17 LAB — GLUCOSE, CAPILLARY: Glucose-Capillary: 159 mg/dL — ABNORMAL HIGH (ref 70–99)

## 2011-02-17 LAB — ANAEROBIC CULTURE

## 2011-02-17 MED ORDER — GADOBENATE DIMEGLUMINE 529 MG/ML IV SOLN: 20.0000 mL | Freq: Once | INTRAVENOUS | Status: AC | PRN

## 2011-02-17 NOTE — Telephone Encounter (Signed)
Pt is in the hosp and would like for Harriett Sine or Dr Caryl Never to call.

## 2011-02-17 NOTE — Telephone Encounter (Signed)
Spoke with wife.  Husband with recent repeat back surgery.  They are interested in medical consult as he has some confusion off and on.  I have advised that they discuss with attending neurosurgeon and [facility] if in agreement.

## 2011-02-18 ENCOUNTER — Inpatient Hospital Stay (HOSPITAL_COMMUNITY): Payer: 59

## 2011-02-18 DIAGNOSIS — M545 Low back pain: Secondary | ICD-10-CM

## 2011-02-18 LAB — BASIC METABOLIC PANEL
Chloride: 95 mEq/L — ABNORMAL LOW (ref 96–112)
GFR calc Af Amer: >60 mL/min (ref >60)
Potassium: 3.7 mEq/L (ref 3.5–5.1)
Sodium: 135 mEq/L (ref 135–145)

## 2011-02-18 LAB — GLUCOSE, CAPILLARY
Glucose-Capillary: 155 mg/dL — ABNORMAL HIGH (ref 70–99)
Glucose-Capillary: 129 mg/dL — ABNORMAL HIGH (ref 70–99)

## 2011-02-25 ENCOUNTER — Emergency Department (HOSPITAL_COMMUNITY): Payer: 59

## 2011-02-25 ENCOUNTER — Emergency Department (HOSPITAL_COMMUNITY)
Admission: EM | Admit: 2011-02-25 | Discharge: 2011-02-26 | Disposition: A | Discharge status: Home / Self Care | Payer: 59 | Attending: Emergency Medicine | Admitting: Emergency Medicine

## 2011-02-25 DIAGNOSIS — R5383 Other fatigue: Secondary | ICD-10-CM | POA: Insufficient documentation

## 2011-02-25 DIAGNOSIS — I456 Pre-excitation syndrome: Secondary | ICD-10-CM | POA: Insufficient documentation

## 2011-02-25 DIAGNOSIS — I10 Essential (primary) hypertension: Secondary | ICD-10-CM | POA: Insufficient documentation

## 2011-02-25 DIAGNOSIS — R630 Anorexia: Secondary | ICD-10-CM | POA: Insufficient documentation

## 2011-02-25 DIAGNOSIS — R197 Diarrhea, unspecified: Secondary | ICD-10-CM | POA: Insufficient documentation

## 2011-02-25 DIAGNOSIS — J3489 Other specified disorders of nose and nasal sinuses: Secondary | ICD-10-CM | POA: Insufficient documentation

## 2011-02-25 DIAGNOSIS — R0989 Other specified symptoms and signs involving the circulatory and respiratory systems: Secondary | ICD-10-CM | POA: Insufficient documentation

## 2011-02-25 DIAGNOSIS — R21 Rash and other nonspecific skin eruption: Secondary | ICD-10-CM | POA: Insufficient documentation

## 2011-02-25 DIAGNOSIS — Z79899 Other long term (current) drug therapy: Secondary | ICD-10-CM | POA: Insufficient documentation

## 2011-02-25 DIAGNOSIS — E860 Dehydration: Secondary | ICD-10-CM | POA: Insufficient documentation

## 2011-02-25 DIAGNOSIS — M549 Dorsalgia, unspecified: Secondary | ICD-10-CM | POA: Insufficient documentation

## 2011-02-25 DIAGNOSIS — R05 Cough: Secondary | ICD-10-CM | POA: Insufficient documentation

## 2011-02-25 DIAGNOSIS — R5381 Other malaise: Secondary | ICD-10-CM | POA: Insufficient documentation

## 2011-02-25 DIAGNOSIS — R0609 Other forms of dyspnea: Secondary | ICD-10-CM | POA: Insufficient documentation

## 2011-02-25 DIAGNOSIS — G479 Sleep disorder, unspecified: Secondary | ICD-10-CM | POA: Insufficient documentation

## 2011-02-25 DIAGNOSIS — R059 Cough, unspecified: Secondary | ICD-10-CM | POA: Insufficient documentation

## 2011-02-25 DIAGNOSIS — E7800 Pure hypercholesterolemia, unspecified: Secondary | ICD-10-CM | POA: Insufficient documentation

## 2011-02-25 DIAGNOSIS — E119 Type 2 diabetes mellitus without complications: Secondary | ICD-10-CM | POA: Insufficient documentation

## 2011-02-25 LAB — BASIC METABOLIC PANEL
CO2: 27 mEq/L (ref 19–32)
Calcium: 9.9 mg/dL (ref 8.4–10.5)
GFR calc non Af Amer: >60 mL/min (ref >60)
Glucose, Bld: 130 mg/dL — ABNORMAL HIGH (ref 70–99)
Potassium: 3.6 mEq/L (ref 3.5–5.1)
Sodium: 138 mEq/L (ref 135–145)

## 2011-02-25 LAB — URINALYSIS, ROUTINE W REFLEX MICROSCOPIC
Nitrite: NEGATIVE
Protein, ur: NEGATIVE mg/dL
Specific Gravity, Urine: 1.014 (ref 1.005–1.030)
Urobilinogen, UA: 0.2 mg/dL (ref 0.0–1.0)

## 2011-02-25 LAB — DIFFERENTIAL
Basophils Absolute: 0 10*3/uL (ref 0.0–0.1)
Basophils Relative: 0 % (ref 0–1)
Eosinophils Absolute: 0.4 10*3/uL (ref 0.0–0.7)
Neutro Abs: 6.6 10*3/uL (ref 1.7–7.7)
Neutrophils Relative %: 70 % (ref 43–77)

## 2011-02-25 LAB — CBC
Hemoglobin: 14.3 g/dL (ref 13.0–17.0)
MCHC: 34.1 g/dL (ref 30.0–36.0)
Platelets: 536 10*3/uL — ABNORMAL HIGH (ref 150–400)
RBC: 5.32 MIL/uL (ref 4.22–5.81)

## 2011-03-04 DIAGNOSIS — M4640 Discitis, unspecified, site unspecified: Secondary | ICD-10-CM

## 2011-03-05 ENCOUNTER — Telehealth: Payer: Self-pay | Admitting: *Deleted

## 2011-03-05 MED ORDER — COLCHICINE 0.6 MG PO TABS: 0.6000 mg | ORAL_TABLET | Freq: Two times a day (BID) | ORAL | Status: DC

## 2011-03-05 NOTE — Telephone Encounter (Signed)
Rx called in, pt informed

## 2011-03-05 NOTE — Telephone Encounter (Signed)
Cannot give prednisone for his gout flare with current infection issues.  He is on ibuprofen without relief.  Try Colcrys 0.6 mg two po initially, then one po bid prn.  Disp #60 with no refill.

## 2011-03-05 NOTE — Telephone Encounter (Signed)
Left great toe is inflamed, red, and tender onset yesterday.  Pt thinks he may have gout.  He is taking IBF and keeping it elevated with no relief.  He can not come in to be seen cause he is on an IV and wants to know if Dr Caryl Never will call him something.  He is taking rocephin IV push and vancomycin bid rx'd by infectious disease dr. [facility] Gunner

## 2011-03-06 NOTE — Op Note (Signed)
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  PROCEDURE:  Lumbar re-exploration with repeat microdiskectomy L5-S1 on left with culture of the disk space and deep tissues.  SURGEON:  Tia Alert, MD  ASSISTANT:  Reinaldo Meeker, MD  ANESTHESIA:  General endotracheal.  COMPLICATIONS:  None apparent.  INDICATIONS FOR THE PROCEDURE:  [name] is a 60 year old gentleman who is not 2 weeks out from a microdiskectomy of L5-S1 on the left side. Over the last couple of days, he developed some low grade fever and recurrent back and left leg pain.  We called in a Medrol Dosepak that held the pain for a couple of days.  The pain has been fairly severe. He has had no significant numbness or weakness.  He came into the ER today.  He had a sed rate of 11, CRP of 5.6 with a white count of 12. He has a history of diabetes.  He had an MRI which did not really confirm postoperative infection, but could not really be ruled out either since there is some mild enhancement of surrounding tissues after surgery like this.  He did have though an obvious recurrent disk herniation L5-S1 on the left.  I recommended a lumbar re-exploration with culture of the tissues followed by repeat microdiskectomy.  He understood the risks, benefits, and expected outcome and wished to proceed.  DESCRIPTION OF PROCEDURE:  The patient was taken to the operating room and after induction of  adequate generalized endotracheal anesthesia, he was rolled into prone position on the Wilson frame and all pressure points were padded. His lumbar region was prepped with DuraPrep and then draped in usual sterile fashion.  Local anesthesia 3 mL was injected and his old incision was opened.  I was able to remove the old sutures, open the suture lines, I identified the lamina, placed my self-retaining retractor, sucked away all the Gelfoam and a postoperative seroma, identified the nerve root, there was obvious recurrent disk herniation. I cultured the deep tissues and then removed the disk herniation with pituitary rongeur and then performed a thorough intradiskal diskectomy. Once the diskectomy was complete, I irrigated with copious amounts of bacitracin containing saline solution, dried all bleeding points, lined the dura with Gelfoam, then closed the fascia with 0 Vicryl, closed the subcutaneous and subcuticular tissue in layers of 2-0 and 3-0 Vicryl, and closed the skin with Benzoin and Steri-Strips.  The drapes were removed.  Sterile dressing was applied.  The patient awakened from general anesthesia and transferred to recovery room in stable condition. At the end of the procedure all sponge, needle and instrument counts were correct.  Tia Alert, MD     DSJ/MEDQ  D:  02/12/2011  T:  02/13/2011  Job:  [id number]  Electronically Signed by Marikay Alar MD on 03/06/2011 11:36:03 AM

## 2011-03-10 ENCOUNTER — Encounter: Payer: Self-pay | Admitting: Internal Medicine

## 2011-03-10 NOTE — Discharge Summary (Signed)
  NAMEJAHZIR, Christopher Navarro NO.:  [id number]  MEDICAL RECORD NO.:  [id number]  LOCATION:  3030                         FACILITY:  [facility]  PHYSICIAN:  Tia Alert, MD     DATE OF BIRTH:  02-12-1951  DATE OF ADMISSION:  02/12/2011 DATE OF DISCHARGE:  02/18/2011                              DISCHARGE SUMMARY   ADMISSION DIAGNOSIS:  Recurrent lumbar disk herniation with possible diskitis.  PROCEDURE:  Lumbar reexploration with repeat microdiskectomy at L5-S1 with culture of the disk space and deep tissues.  BRIEF HISTORY OF PRESENT ILLNESS:  Christopher Navarro is a 60 year old gentleman who was about 2 weeks out from microdiskectomy of L5-S1 on the left.  He presented to the emergency department with severe back pain.  He had some low-grade fever.  He had some leg pain.  We called him in Medrol Dosepak and that did help for a couple of days, but the pain had been fairly severe.  He had no significant numbness or weakness.  He was brought to the urgency department where he had a sed rate of 11 with a CRP of 5.6 and a white count of 12.  He had a history of diabetes.  MRI did not really confirm postoperative infection, but could not also rule this out.  I recommend lumbar reexploration with repeat microdiskectomy and deep tissue culture.  He understood the risks, benefits, and expected outcome and wished to proceed.  HOSPITAL COURSE:  The patient was admitted on February 12, 2011, from the emergency room and underwent a repeat microdiskectomy of L5-S1 on the left.  I cultured the disk space and also the deep tissues.  I also sent disk material for culture.  None of this grew through his hospital stay. However, his clinical exam was most likely that of diskitis.  He continued to have severe back pain after the surgery.  His radicular pain was gone.  His incision remained clean, dry, and intact.  We kept him on postoperative antibiotics.  We had Infectious Disease see him. They  recommended placement of a PICC line with postop antibiotics for presumed diskitis, although the cultures never grew anything.  We did get Physical and Occupational Therapy to work with the patient.  He was slow to mobilize because of his pain.  He did have good strength in his lower extremities and was able to ambulate fine with good strength.  He simply had pain.  He did have a postoperative MRI which showed no worsening and showed removal of the postoperative disk.  He was discharged to home with home antibiotics with plans to follow up in 2 weeks.  FINAL DIAGNOSIS:  Redo microdiskectomy with possible diskitis.     Tia Alert, MD     DSJ/MEDQ  D:  03/09/2011  T:  03/09/2011  Job:  [id number]  Electronically Signed by Marikay Alar MD on 03/10/2011 03:18:05 PM

## 2011-03-24 ENCOUNTER — Encounter: Payer: Self-pay | Admitting: Internal Medicine

## 2011-03-24 ENCOUNTER — Ambulatory Visit (INDEPENDENT_AMBULATORY_CARE_PROVIDER_SITE_OTHER): Payer: 59 | Admitting: Internal Medicine

## 2011-03-24 VITALS — BP 160/83 | HR 76 | Temp 98.1°F | Ht 73.0 in | Wt 240.4 lb

## 2011-03-24 DIAGNOSIS — M519 Unspecified thoracic, thoracolumbar and lumbosacral intervertebral disc disorder: Secondary | ICD-10-CM

## 2011-03-24 DIAGNOSIS — M4640 Discitis, unspecified, site unspecified: Secondary | ICD-10-CM

## 2011-03-24 LAB — SEDIMENTATION RATE: Sed Rate: 10 mm/hr (ref 0–16)

## 2011-03-24 LAB — C-REACTIVE PROTEIN: CRP: 0.6 mg/dL — ABNORMAL HIGH (ref <0.6)

## 2011-03-24 NOTE — Assessment & Plan Note (Addendum)
He has responded very well to empiric antibiotic therapy for probable postoperative lumbar discitis. I will stop his antibiotics today, remove his PICC and repeat his C-reactive protein and sedimentation rate.  He has some new tinnitus that he attributes to vancomycin. We will follow that up at his next visit.

## 2011-03-24 NOTE — Progress Notes (Signed)
  Subjective:    Patient ID: Christopher Navarro, male    DOB: Dec 22, 1950, 60 y.o.   MRN: [id number]  HPI Christopher Navarro is in for his hospital followup visit. He is a 60 year old who underwent lumbar surgery on June 21. Initially his pain was better then became much more severe about 24 hours postoperatively. He had some improvement with steroids but then had more severe left leg pain, low-grade fever and chills leading to readmission on July 5. An MRI showed postoperative changes and a recurrent herniated disc so he underwent surgery again. Operative Gram stain and cultures were negative but he continued to have severe pain in his C. reactive protein rose to 17.9 and his sed rate to 40. Repeat MRI revealed L5-S1 in place edema and enhancement and a fluid collection which could have been postoperative changes but also raised the concern for discitis. I recommended empiric therapy and he is now in day 62 of IV vancomycin and Rocephin. He recalls that his back pain was "off the charts" while in the hospital. For the past 2 weeks he has not required any pain medication and states that he has no pain at all. He is now walking about 40 minutes every morning and every evening without much difficulty. He is planning on starting formal physical therapy tomorrow. He has not had any problems with his PICC or his antibiotics.    Review of Systems     Objective:   Physical Exam  Constitutional: No distress.  Musculoskeletal:       His right arm PICC site is normal.  His lumbar incision has healed nicely. His gait is normal.          Assessment & Plan:

## 2011-03-24 NOTE — Progress Notes (Signed)
Order to remove PICC line obtained from Dr. Orvan Falconer. Pt. identified with name and date of birth. PICC dressing removed, site unremarkable. PICC line removed using sterile procedure @ 1600 pm. PICC length equal to that noted in pt's hospital chart of 46 cm. Sterile petroleum gauze + sterile 4X4 applied to PICC site, pressure applied for 10 minutes and covered with Medipore tape as a pressure dressing. Pt. instructed to limit use of arm for 1 hour. Pt. instructed that the pressure dressing should remain in place for 24 hours. Pt. verablized understanding of these instructions. Jennet Maduro, RN

## 2011-04-02 ENCOUNTER — Ambulatory Visit (INDEPENDENT_AMBULATORY_CARE_PROVIDER_SITE_OTHER): Payer: 59 | Admitting: Family Medicine

## 2011-04-02 ENCOUNTER — Encounter: Payer: Self-pay | Admitting: Family Medicine

## 2011-04-02 DIAGNOSIS — E119 Type 2 diabetes mellitus without complications: Secondary | ICD-10-CM

## 2011-04-02 DIAGNOSIS — B356 Tinea cruris: Secondary | ICD-10-CM

## 2011-04-02 DIAGNOSIS — H9319 Tinnitus, unspecified ear: Secondary | ICD-10-CM

## 2011-04-02 MED ORDER — NYSTATIN 100000 UNIT/GM EX CREA: TOPICAL_CREAM | Freq: Two times a day (BID) | CUTANEOUS | Status: AC

## 2011-04-02 NOTE — Progress Notes (Signed)
  Subjective:    Patient ID: Christopher Navarro, male    DOB: 08-08-1951, 60 y.o.   MRN: [id number]  HPI Patient here with several issues. Recent history is that he had lumbar back surgery June 21. Initially did well followed by recurrent pain and readmitted with recurrent disc surgery July 5. Subsequently had some low-grade fever and persistent back pain. Repeat MRI revealed question of possible discitis with elevated CRP. Patient initially placed on Cipro and vancomycin but developed a rash with Cipro. Subsequently treated for several weeks with Rocephin and vancomycin. Overall back pain greatly improved. Walking 45 minutes per day.  Bilateral tinnitus which started about 2 weeks ago. He has just recently finished his vancomycin. He denies any hearing loss. No vertigo.  No history of similar symptoms in past.  Pruritic rash in groin area bilaterally. Started about 4 weeks ago. Recent antibiotic history as above. Using some type of over-the-counter powder but has not tried antifungals.  Type 2 diabetes. Well controlled with fasting low 100s.  No symptoms of hyperglycemia. Maintained on metformin  Past Medical History  Diagnosis Date  . HYPERTENSION 06/20/2009  . FASCIITIS, PLANTAR 06/20/2009  . 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 Dyslipidemia   . Diabetes mellitus type II    Past Surgical History  Procedure Date  . Knee arthroscopy 2002  . Carpal tunnel release 2004    Bilateral  . Ablasion 1997  . Back surgery 2006    reports that he has quit smoking. He has never used smokeless tobacco. He reports that he does not drink alcohol or use illicit drugs. family history includes Arthritis in his other; Hyperlipidemia in his other; Hypertension in his other; and Stroke in his mother. Allergies  Allergen Reactions  . Ciprofloxacin Rash      Review of Systems  Constitutional: Positive for fatigue. Negative for fever, chills and unexpected weight change.    HENT: Positive for tinnitus. Negative for hearing loss, ear pain, congestion, sinus pressure and ear discharge.   Respiratory: Negative for cough and shortness of breath.   Cardiovascular: Negative for chest pain.  Gastrointestinal: Negative for abdominal pain.  Genitourinary: Negative for dysuria.  Skin: Positive for rash.  Neurological: Negative for dizziness and headaches.       Objective:   Physical Exam  Constitutional: He is oriented to person, place, and time. He appears well-developed and well-nourished. No distress.  HENT:  Right Ear: External ear normal.  Left Ear: External ear normal.  Mouth/Throat: Oropharynx is clear and moist. No oropharyngeal exudate.  Neck: Neck supple. No thyromegaly present.  Cardiovascular: Normal rate, regular rhythm and normal heart sounds.   No murmur heard. Pulmonary/Chest: Effort normal and breath sounds normal. No respiratory distress. He has no wheezes. He has no rales.  Musculoskeletal: He exhibits no edema.  Lymphadenopathy:    He has no cervical adenopathy.  Neurological: He is alert and oriented to person, place, and time.  Skin:       Erythematous well demarcated rash groin region with minimal scaling.          Assessment & Plan:  #1 bilateral tinnitus. Patient has concerns whether this is related to recent vancomycin. Doubtful. We'll obtain audiogram as baseline. #2 type 2 diabetes well controlled. Continue current medications  #3 tinia cruris. Nystatin cream twice daily and keep area dry

## 2011-04-02 NOTE — Patient Instructions (Signed)
Be in touch for any change of hearing.

## 2011-04-09 ENCOUNTER — Ambulatory Visit (INDEPENDENT_AMBULATORY_CARE_PROVIDER_SITE_OTHER): Payer: 59 | Admitting: Family Medicine

## 2011-04-09 ENCOUNTER — Encounter: Payer: Self-pay | Admitting: Family Medicine

## 2011-04-09 DIAGNOSIS — H9319 Tinnitus, unspecified ear: Secondary | ICD-10-CM

## 2011-04-09 DIAGNOSIS — I10 Essential (primary) hypertension: Secondary | ICD-10-CM

## 2011-04-09 DIAGNOSIS — F419 Anxiety disorder, unspecified: Secondary | ICD-10-CM

## 2011-04-09 DIAGNOSIS — F411 Generalized anxiety disorder: Secondary | ICD-10-CM

## 2011-04-09 MED ORDER — LISINOPRIL-HYDROCHLOROTHIAZIDE 10-12.5 MG PO TABS: 1.0000 | ORAL_TABLET | Freq: Every day | ORAL | Status: DC

## 2011-04-09 MED ORDER — SERTRALINE HCL 50 MG PO TABS: 50.0000 mg | ORAL_TABLET | Freq: Every day | ORAL | Status: DC

## 2011-04-09 NOTE — Patient Instructions (Signed)
Stop Mavik Start lisinopril HCTZ Keep up your walking Schedule follow up in 3 weeks.

## 2011-04-09 NOTE — Progress Notes (Signed)
  Subjective:    Patient ID: Christopher Navarro, male    DOB: [birth year], 60 y.o.   MRN: [id number]  HPI Patient seen for several items as below. Recent history of discitis with prolonged antibiotics. He was on vancomycin and now off all antibiotics. He has developed some tinnitus which he thinks may be related to the vancomycin. Mild high-frequency hearing loss at last visit but not certain if this is acute or chronic.  Elevated blood pressure. Yesterday at 180/100 taken at emergency services. Earlier today 170/90. He has long history of hypertension currently treated with trandolapril 4 mg daily and verapamil 240 mg daily. He feels some of this elevation may be related to musculoskeletal pains and increased stress.  Some increased depressive symptoms and increased anxiety symptoms since his recent hospitalization. He has multiple back and musculoskeletal aches and has increased fear of exacerbation of pain. This is causing him difficulty with sleep. He has recently hesitated to leave the house at times. Increased depressed mood. No suicidal ideation.   Review of Systems  Constitutional: Negative for fever and chills.  Respiratory: Negative for cough and shortness of breath.   Cardiovascular: Negative for chest pain, palpitations and leg swelling.  Gastrointestinal: Negative for abdominal pain.  Psychiatric/Behavioral: Positive for sleep disturbance and dysphoric mood. Negative for suicidal ideas, confusion and agitation. The patient is nervous/anxious.        Objective:   Physical Exam  Constitutional: He is oriented to person, place, and time. He appears well-developed and well-nourished.  HENT:  Right Ear: External ear normal.  Left Ear: External ear normal.  Mouth/Throat: Oropharynx is clear and moist.  Neck: Neck supple.  Cardiovascular: Normal rate and regular rhythm.   Pulmonary/Chest: Effort normal and breath sounds normal. No respiratory distress. He has no wheezes. He has no rales.    Musculoskeletal: He exhibits no edema.  Lymphadenopathy:    He has no cervical adenopathy.  Neurological: He is alert and oriented to person, place, and time.  Psychiatric:       Patient appears slightly anxious but is otherwise appropriate in interactions          Assessment & Plan:  #1 tinnitus of uncertain etiology. Possibly related to recent vancomycin. #2 hypertension poorly controlled. Discontinue Mavik. Start benazepril/HCTZ 10/12.5 one daily and continue verapamil 240 mg daily. Reassess blood pressure 3 weeks. Continue walking and weight loss efforts. #3 increased depression and anxiety symptoms. Discussed possible counseling. Continue regular exercise. Initiate sertraline 50 mg once daily and reassess in 3 weeks

## 2011-04-15 ENCOUNTER — Encounter: Payer: Self-pay | Admitting: Internal Medicine

## 2011-04-30 ENCOUNTER — Encounter: Payer: Self-pay | Admitting: Family Medicine

## 2011-04-30 ENCOUNTER — Ambulatory Visit (INDEPENDENT_AMBULATORY_CARE_PROVIDER_SITE_OTHER): Payer: 59 | Admitting: Family Medicine

## 2011-04-30 DIAGNOSIS — R195 Other fecal abnormalities: Secondary | ICD-10-CM

## 2011-04-30 DIAGNOSIS — F32A Depression, unspecified: Secondary | ICD-10-CM

## 2011-04-30 DIAGNOSIS — I10 Essential (primary) hypertension: Secondary | ICD-10-CM

## 2011-04-30 DIAGNOSIS — F329 Major depressive disorder, single episode, unspecified: Secondary | ICD-10-CM

## 2011-04-30 DIAGNOSIS — H9319 Tinnitus, unspecified ear: Secondary | ICD-10-CM

## 2011-04-30 NOTE — Progress Notes (Signed)
  Subjective:    Patient ID: Christopher Navarro, male    DOB: 10-15-1950, 60 y.o.   MRN: [id number]  HPI Patient seen for medical followup. Ongoing frustration regarding tinnitus.  He has seen ENT and has been referred to [facility] for further counseling and management of this issue. He has increased frustration regarding constancy of his tinnitus. He has some high frequency hearing loss.   Hypertension with recent change in medication to lisinopril HCTZ. Blood pressure is improved. No orthostasis. He is having some early morning loose stools following sertraline and new blood pressure medication and wonders if this may be related. No nausea or vomiting. No bloody stools. Generally has 1-2 loose stools per day. Takes metformin but has never had previous issues with metformin alone.  Mood disorder. Depressed mood and we started sertraline last visit which he thinks has helped somewhat. He still has considerable anxiety at times. He is still exercising regularly.   Review of Systems  Constitutional: Negative for fever, chills and appetite change.  HENT: Positive for tinnitus. Negative for ear pain and ear discharge.   Respiratory: Negative for cough and shortness of breath.   Cardiovascular: Negative for chest pain.  Neurological: Negative for dizziness, syncope, weakness and headaches.       Objective:   Physical Exam  Constitutional: He is oriented to person, place, and time. He appears well-developed and well-nourished.  HENT:  Mouth/Throat: Oropharynx is clear and moist.  Neck: Neck supple. No thyromegaly present.  Cardiovascular: Normal rate and regular rhythm.   Pulmonary/Chest: Effort normal and breath sounds normal. No respiratory distress. He has no wheezes. He has no rales.  Musculoskeletal: He exhibits no edema.  Neurological: He is alert and oriented to person, place, and time. No cranial nerve deficit.          Assessment & Plan:  #1 ongoing tinnitus. Patient has  been referred to ENT and audiology clinic . #2 hypertension improved #3 depression improved on sertraline #4 loose stools which patient thinks may be attributed to sertraline or lisinopril HCTZ. He will start taking lisinopril HCTZ at dinner time and see if this makes a difference with diarrhea symptoms. Hopefully this will help to sort out if one of these medications is related.

## 2011-04-30 NOTE — Patient Instructions (Signed)
Consider taking lisinopril at night and be in touch if loose stools persist.

## 2011-05-04 ENCOUNTER — Telehealth: Payer: Self-pay | Admitting: Family Medicine

## 2011-05-04 NOTE — Telephone Encounter (Signed)
Zoloft side effects include ringing in ears (tinnitis), pt history

## 2011-05-04 NOTE — Telephone Encounter (Signed)
Pt is having concerns about sertraline (ZOLOFT) 50 MG tablet. Pt is requesting a phone call.

## 2011-05-05 NOTE — Telephone Encounter (Signed)
Pt informed and he will continue on the Zoloft

## 2011-05-05 NOTE — Telephone Encounter (Signed)
He was having tinnitus prior to Zoloft and I do not see this listed as side effect.  Is he having any other side effects?  If ringing is only symptom would persist as this has not had time to help mood issues yet.

## 2011-07-04 ENCOUNTER — Other Ambulatory Visit: Payer: Self-pay | Admitting: Family Medicine

## 2011-07-08 ENCOUNTER — Other Ambulatory Visit: Payer: Self-pay

## 2011-07-08 MED ORDER — SIMVASTATIN 40 MG PO TABS: 40.0000 mg | ORAL_TABLET | Freq: Every day | ORAL | Status: DC

## 2011-07-08 NOTE — Telephone Encounter (Signed)
Refill #30  1 RF - needs to be seen - last office visit was instructed to return in 3 mos

## 2011-08-03 ENCOUNTER — Other Ambulatory Visit: Payer: Self-pay | Admitting: Family Medicine

## 2011-08-10 ENCOUNTER — Other Ambulatory Visit (INDEPENDENT_AMBULATORY_CARE_PROVIDER_SITE_OTHER): Payer: 59

## 2011-08-10 DIAGNOSIS — Z0000 Encounter for general adult medical examination without abnormal findings: Secondary | ICD-10-CM

## 2011-08-10 LAB — PSA: PSA: 0.41 ng/mL (ref 0.10–4.00)

## 2011-08-10 LAB — POCT URINALYSIS DIPSTICK
Bilirubin, UA: NEGATIVE
Glucose, UA: NEGATIVE
Leukocytes, UA: NEGATIVE
Nitrite, UA: NEGATIVE

## 2011-08-10 LAB — CBC WITH DIFFERENTIAL/PLATELET
Eosinophils Absolute: 0.2 10*3/uL (ref 0.0–0.7)
Eosinophils Relative: 2.7 % (ref 0.0–5.0)
MCV: 81.9 fl (ref 78.0–100.0)
Monocytes Absolute: 0.6 10*3/uL (ref 0.1–1.0)
Neutrophils Relative %: 70 % (ref 43.0–77.0)
Platelets: 345 10*3/uL (ref 150.0–400.0)
WBC: 7.8 10*3/uL (ref 4.5–10.5)

## 2011-08-10 LAB — HEPATIC FUNCTION PANEL
ALT: 33 U/L (ref 0–53)
Bilirubin, Direct: 0.1 mg/dL (ref 0.0–0.3)
Total Bilirubin: 0.8 mg/dL (ref 0.3–1.2)

## 2011-08-10 LAB — LIPID PANEL
Cholesterol: 145 mg/dL (ref 0–200)
HDL: 44.2 mg/dL (ref >39.00)
LDL Cholesterol: 84 mg/dL (ref 0–99)
Triglycerides: 86 mg/dL (ref 0.0–149.0)
VLDL: 17.2 mg/dL (ref 0.0–40.0)

## 2011-08-10 LAB — BASIC METABOLIC PANEL
BUN: 16 mg/dL (ref 6–23)
Chloride: 100 mEq/L (ref 96–112)
Creatinine, Ser: 1 mg/dL (ref 0.4–1.5)
GFR: 81.15 mL/min (ref >60.00)

## 2011-08-10 LAB — MICROALBUMIN / CREATININE URINE RATIO
Creatinine,U: 263.1 mg/dL
Microalb, Ur: 2.7 mg/dL — ABNORMAL HIGH (ref 0.0–1.9)

## 2011-08-17 ENCOUNTER — Encounter: Payer: Self-pay | Admitting: Family Medicine

## 2011-08-17 ENCOUNTER — Ambulatory Visit (INDEPENDENT_AMBULATORY_CARE_PROVIDER_SITE_OTHER): Payer: 59 | Admitting: Family Medicine

## 2011-08-17 VITALS — BP 122/72 | HR 72 | Temp 98.4°F | Resp 12 | Ht 73.0 in | Wt 219.0 lb

## 2011-08-17 DIAGNOSIS — Z0000 Encounter for general adult medical examination without abnormal findings: Secondary | ICD-10-CM

## 2011-08-17 DIAGNOSIS — N529 Male erectile dysfunction, unspecified: Secondary | ICD-10-CM

## 2011-08-17 MED ORDER — SILDENAFIL CITRATE 100 MG PO TABS: 50.0000 mg | ORAL_TABLET | Freq: Every day | ORAL | Status: AC | PRN

## 2011-08-17 NOTE — Patient Instructions (Signed)
Consider tapering off sertraline by taking one half tablet for one week then discontinue.

## 2011-08-17 NOTE — Progress Notes (Signed)
Subjective:    Patient ID: Christopher Navarro, male    DOB: [birth year], 61 y.o.   MRN: [id number]  HPI  Complete physical. Patient's history of hypertension, type 2 diabetes, dyslipidemia and some chronic back problems. Has made some serious lifestyle changes since last visit has lost several pounds and is exercising regularly and feels good overall. Colonoscopy 2010. Immunizations up to date.  Medications reviewed. Compliant with all. Occasional slow urinary stream but no nocturia.  Occasional problems with erectile dysfunction. No nitroglycerin use. Recently placed on sertraline for anxiety and depression issues. Those are stable and he wishes to consider tapering.  Fair libido.  No nitroglycerin use.  Past Medical History  Diagnosis Date  . HYPERTENSION 06/20/2009  . FASCIITIS, PLANTAR 06/20/2009  . 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 Dyslipidemia   . Diabetes mellitus type II    Past Surgical History  Procedure Date  . Knee arthroscopy 2002  . Carpal tunnel release 2004    Bilateral  . Ablasion 1997  . Back surgery 2006    reports that he has quit smoking. He has never used smokeless tobacco. He reports that he does not drink alcohol or use illicit drugs. family history includes Arthritis in his other; Hyperlipidemia in his other; Hypertension in his other; and Stroke in his mother. Allergies  Allergen Reactions  . Ciprofloxacin Rash    hives      Review of Systems  Constitutional: Negative for fever, activity change, appetite change and fatigue.  HENT: Negative for ear pain, congestion and trouble swallowing.   Eyes: Negative for pain and visual disturbance.  Respiratory: Negative for cough, shortness of breath and wheezing.   Cardiovascular: Negative for chest pain and palpitations.  Gastrointestinal: Negative for nausea, vomiting, abdominal pain, diarrhea, constipation, blood in stool, abdominal distention and rectal pain.    Genitourinary: Negative for dysuria, hematuria and testicular pain.  Musculoskeletal: Negative for joint swelling and arthralgias.  Skin: Negative for rash.  Neurological: Negative for dizziness, syncope and headaches.  Hematological: Negative for adenopathy.  Psychiatric/Behavioral: Negative for confusion and dysphoric mood.       Objective:   Physical Exam  Constitutional: He is oriented to person, place, and time. He appears well-developed and well-nourished. No distress.  HENT:  Head: Normocephalic and atraumatic.  Right Ear: External ear normal.  Left Ear: External ear normal.  Mouth/Throat: Oropharynx is clear and moist.  Eyes: Conjunctivae and EOM are normal. Pupils are equal, round, and reactive to light.  Neck: Normal range of motion. Neck supple. No thyromegaly present.  Cardiovascular: Normal rate, regular rhythm and normal heart sounds.   No murmur heard. Pulmonary/Chest: No respiratory distress. He has no wheezes. He has no rales.  Abdominal: Soft. Bowel sounds are normal. He exhibits no distension and no mass. There is no tenderness. There is no rebound and no guarding.  Musculoskeletal: He exhibits no edema.  Lymphadenopathy:    He has no cervical adenopathy.  Neurological: He is alert and oriented to person, place, and time. He displays normal reflexes. No cranial nerve deficit.  Skin: No rash noted.  Psychiatric: He has a normal mood and affect.          Assessment & Plan:  #1 Complete physical. Patient has made some very positive lifestyle changes. Continue exercise and weight control efforts. Labs reviewed with patient. Diabetes well controlled.  #2 Erectile dysfunction. Trial of Viagra 100 mg one half to one tablet  daily as needed.  #3 history of depression/anxiety. Improved. Tried tapering off sertraline

## 2011-08-21 ENCOUNTER — Other Ambulatory Visit: Payer: Self-pay | Admitting: Family Medicine

## 2011-08-21 DIAGNOSIS — R300 Dysuria: Secondary | ICD-10-CM

## 2011-08-24 ENCOUNTER — Other Ambulatory Visit (INDEPENDENT_AMBULATORY_CARE_PROVIDER_SITE_OTHER): Payer: 59

## 2011-08-24 DIAGNOSIS — N529 Male erectile dysfunction, unspecified: Secondary | ICD-10-CM

## 2011-08-24 LAB — POCT URINALYSIS DIPSTICK
Bilirubin, UA: NEGATIVE
Ketones, UA: NEGATIVE
Protein, UA: NEGATIVE
Spec Grav, UA: 1.015
pH, UA: 6.5

## 2011-08-25 NOTE — Progress Notes (Signed)
Quick Note:  Pt aware and states he will call back when he has decided which option to choose. ______

## 2011-09-01 ENCOUNTER — Other Ambulatory Visit: Payer: Self-pay | Admitting: Family Medicine

## 2011-11-03 ENCOUNTER — Other Ambulatory Visit: Payer: Self-pay | Admitting: *Deleted

## 2011-11-03 MED ORDER — METFORMIN HCL 500 MG PO TABS: 500.0000 mg | ORAL_TABLET | Freq: Every day | ORAL | Status: DC

## 2012-01-06 ENCOUNTER — Other Ambulatory Visit: Payer: Self-pay | Admitting: *Deleted

## 2012-01-06 MED ORDER — METFORMIN HCL 500 MG PO TABS: 500.0000 mg | ORAL_TABLET | Freq: Every day | ORAL | Status: DC

## 2012-06-16 IMAGING — CR DG CHEST 2V
1 series · 1 of 1 positions shown · non-contrast
Comparison: May 12, 2005

CLINICAL DATA: Pain and fever, hypertension diabetes

CHEST - 2 VIEW

[view not recorded]
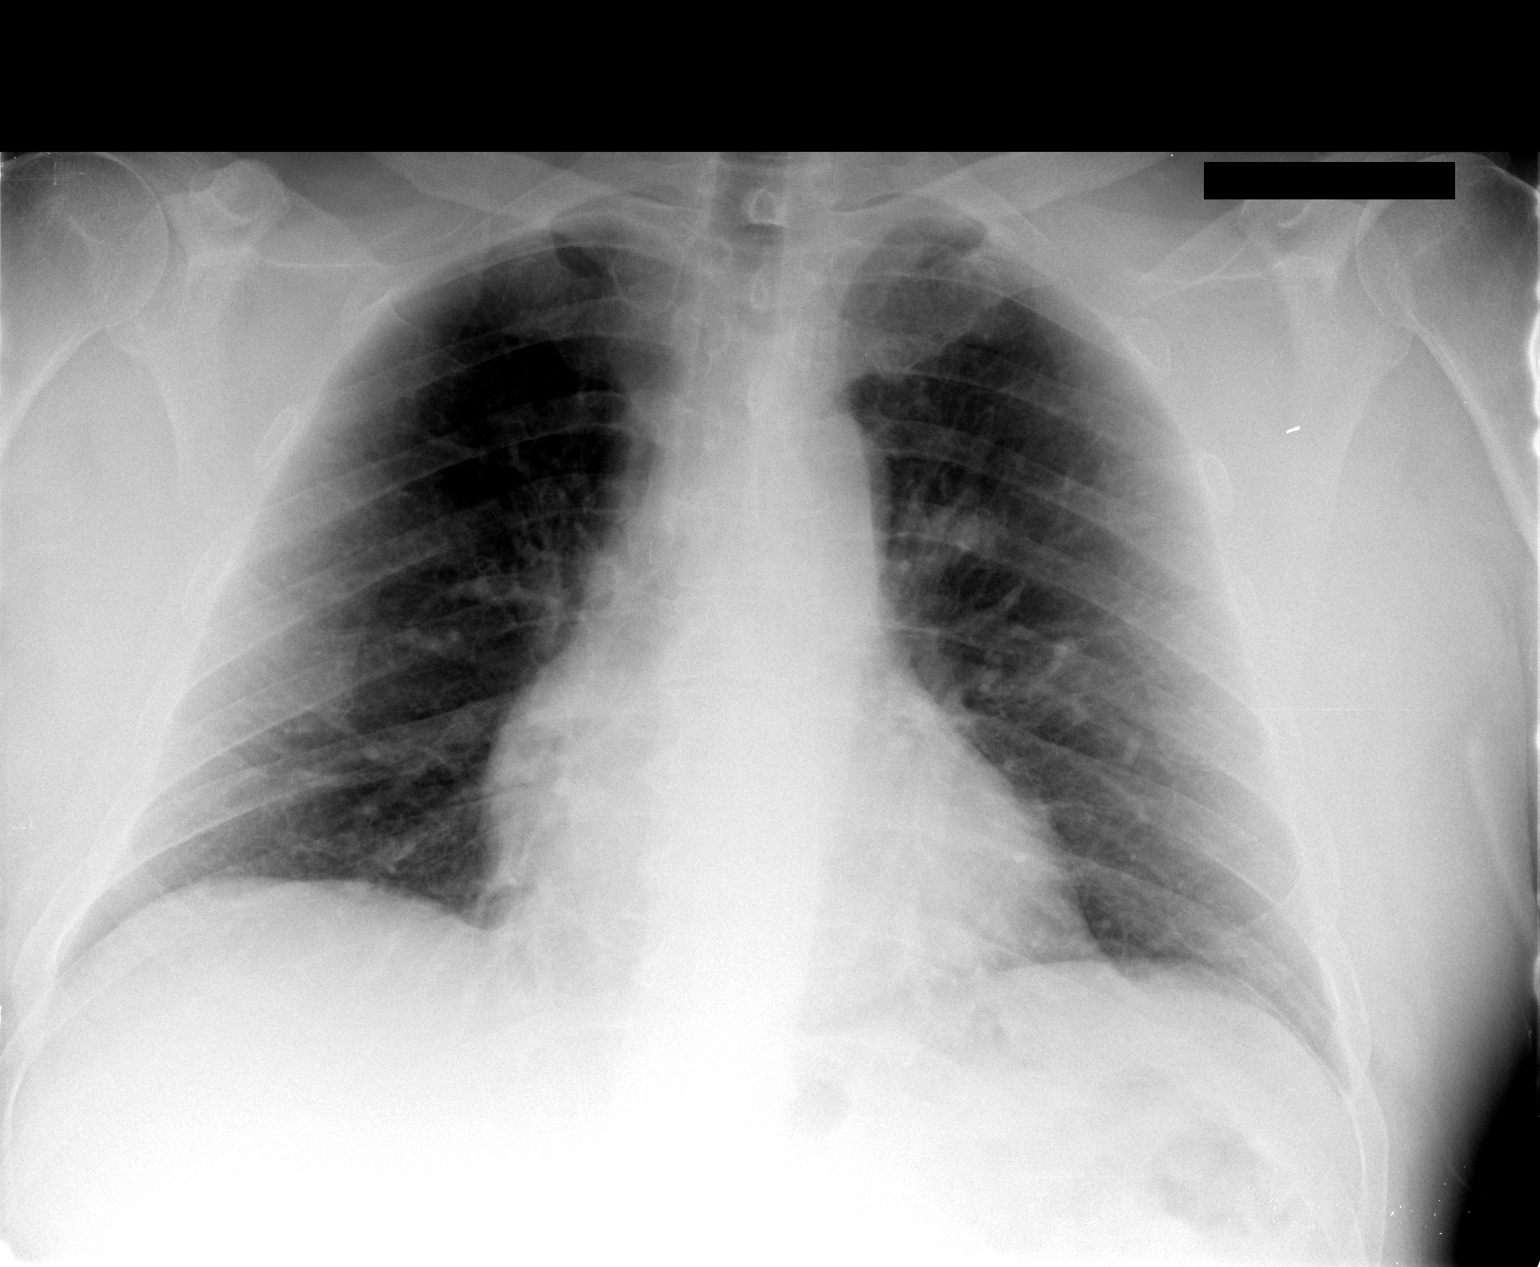

[1 of 1 positions shown; findings below may reference images not displayed]

FINDINGS: The cardiac silhouette, mediastinum, pulmonary
vasculature are within normal limits.  Both lungs are clear.
There is no acute bony abnormality.
IMPRESSION: There is no evidence of acute cardiac or pulmonary process.

## 2012-06-21 IMAGING — CR DG LUMBAR SPINE 2-3V
3 series · 3 of 3 positions shown · non-contrast
Comparison: MRI lumbar spine 02/12/2011.

CLINICAL DATA: Back pain.

LUMBAR SPINE - 2-3 VIEW

[t l-spine a.p.]
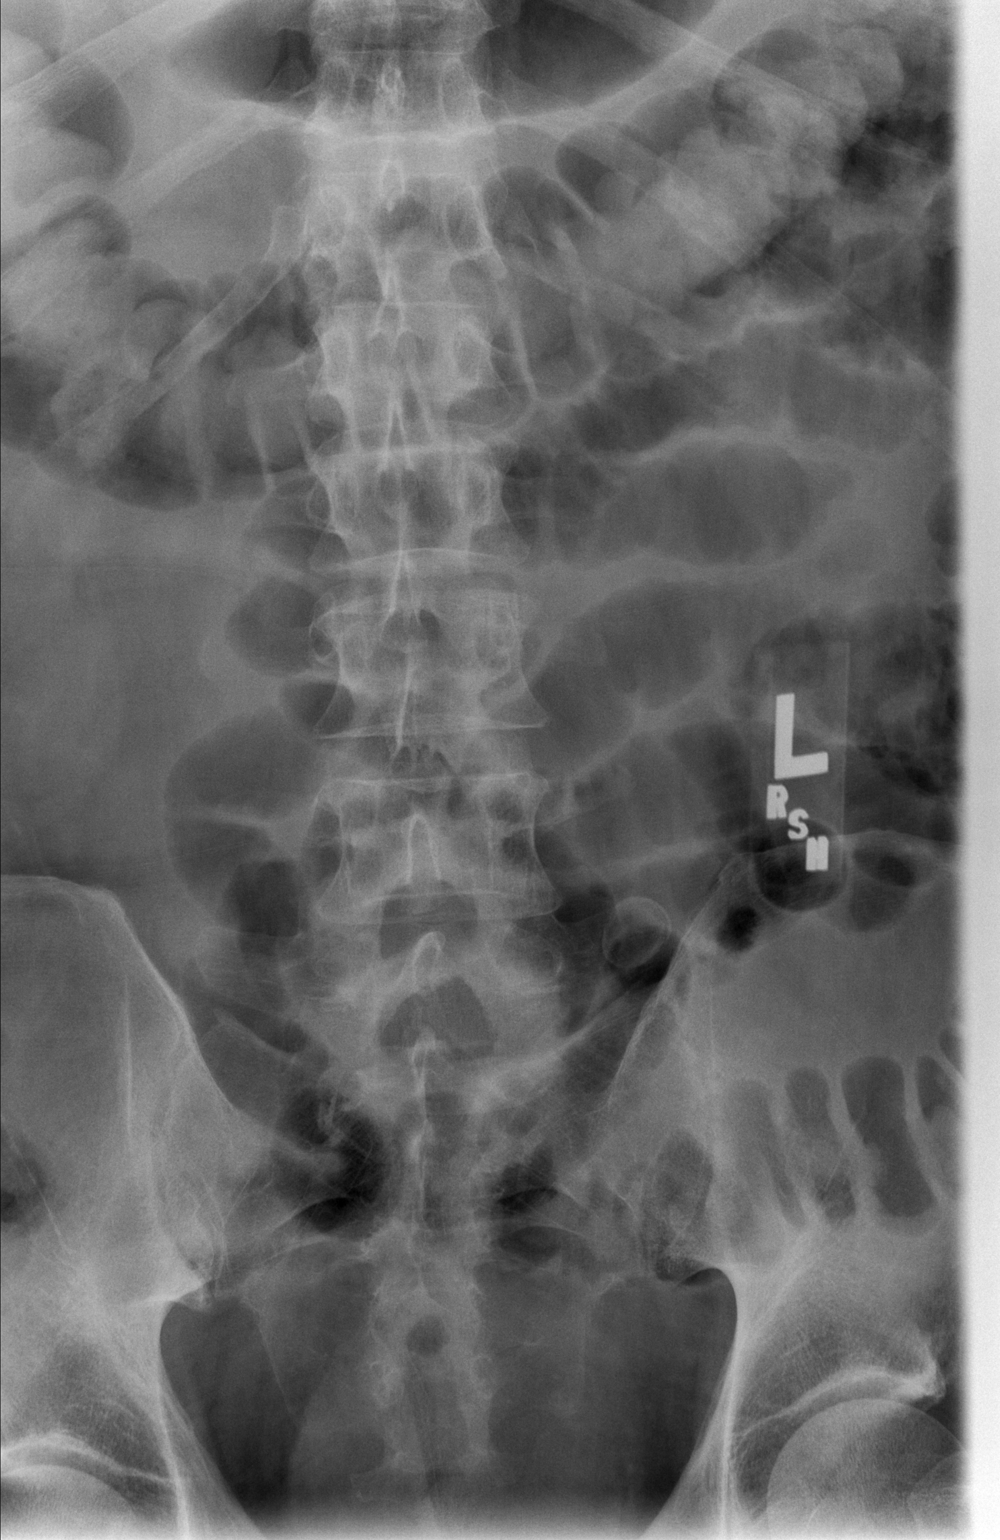

[t l-spine lat]
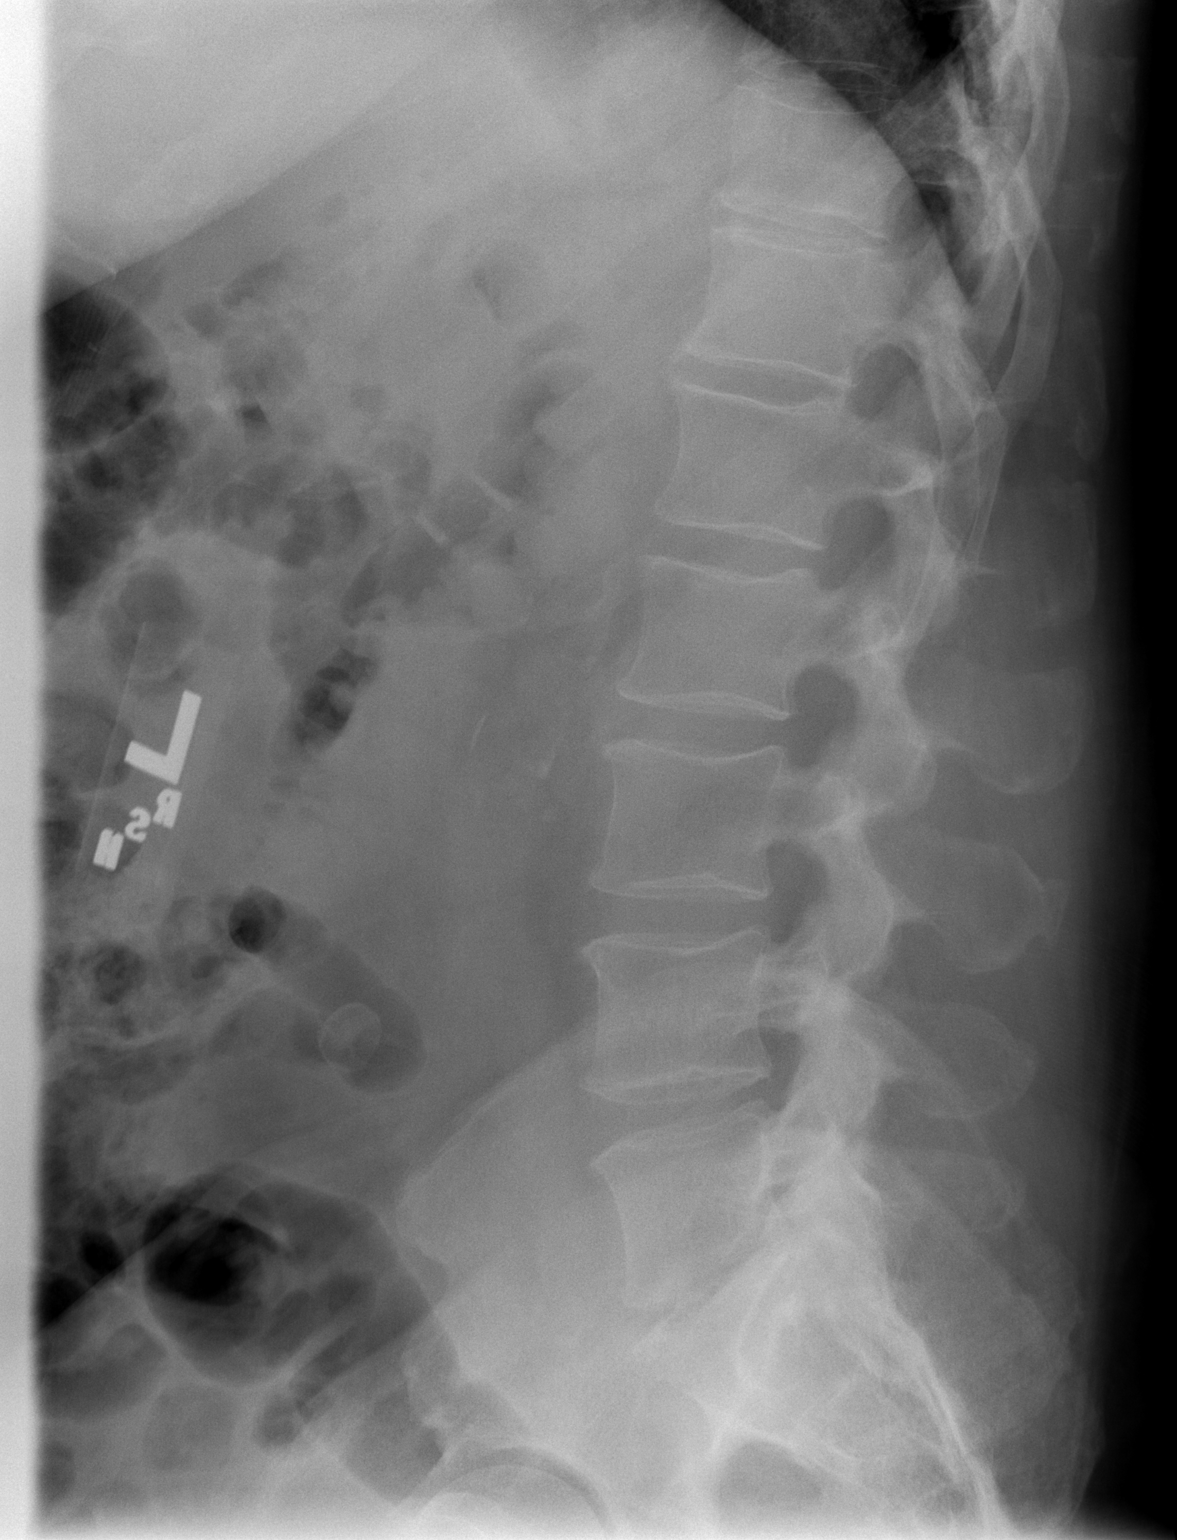

[t l-spine l5-s1 spot]
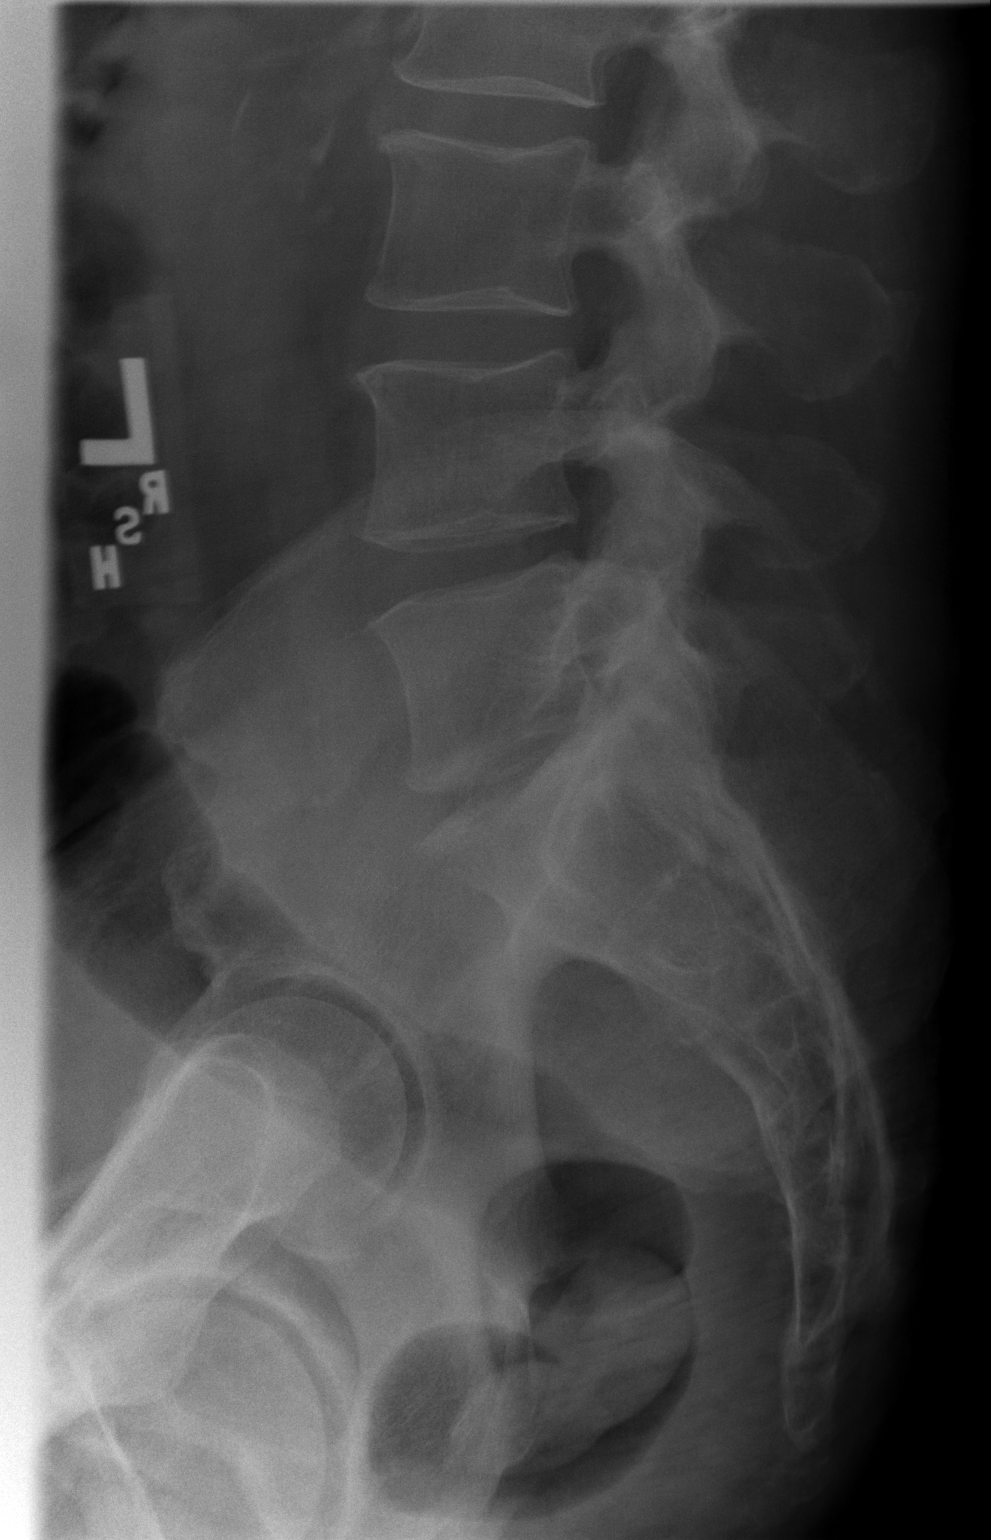

[3 of 3 positions shown; findings below may reference images not displayed]

FINDINGS: Vertebral body height and alignment are maintained.  Loss
of disc space height is noted L4-5 and L5-S1.  Paraspinous
structures are unremarkable.
IMPRESSION: No acute finding.  Degenerative disc disease L4-5 and L5-S1.

## 2012-08-11 ENCOUNTER — Other Ambulatory Visit (INDEPENDENT_AMBULATORY_CARE_PROVIDER_SITE_OTHER): Payer: 59

## 2012-08-11 DIAGNOSIS — Z0000 Encounter for general adult medical examination without abnormal findings: Secondary | ICD-10-CM

## 2012-08-11 LAB — MICROALBUMIN / CREATININE URINE RATIO
Creatinine,U: 103.4 mg/dL
Microalb Creat Ratio: 0.3 mg/g (ref 0.0–30.0)

## 2012-08-11 LAB — POCT URINALYSIS DIPSTICK
Bilirubin, UA: NEGATIVE
Ketones, UA: NEGATIVE
Leukocytes, UA: NEGATIVE
Spec Grav, UA: 1.015
pH, UA: 7

## 2012-08-11 LAB — BASIC METABOLIC PANEL
Calcium: 9 mg/dL (ref 8.4–10.5)
Creatinine, Ser: 0.9 mg/dL (ref 0.4–1.5)
GFR: 88.04 mL/min (ref >60.00)
Glucose, Bld: 111 mg/dL — ABNORMAL HIGH (ref 70–99)
Sodium: 135 mEq/L (ref 135–145)

## 2012-08-11 LAB — CBC WITH DIFFERENTIAL/PLATELET
Eosinophils Relative: 2.9 % (ref 0.0–5.0)
HCT: 47.8 % (ref 39.0–52.0)
Hemoglobin: 16 g/dL (ref 13.0–17.0)
Lymphs Abs: 2 10*3/uL (ref 0.7–4.0)
MCV: 81.4 fl (ref 78.0–100.0)
Monocytes Absolute: 0.6 10*3/uL (ref 0.1–1.0)
Neutro Abs: 4.3 10*3/uL (ref 1.4–7.7)
Platelets: 299 10*3/uL (ref 150.0–400.0)
RDW: 14.5 % (ref 11.5–14.6)

## 2012-08-11 LAB — HEPATIC FUNCTION PANEL
Alkaline Phosphatase: 74 U/L (ref 39–117)
Bilirubin, Direct: 0.2 mg/dL (ref 0.0–0.3)

## 2012-08-11 LAB — LIPID PANEL
HDL: 45.5 mg/dL (ref >39.00)
Total CHOL/HDL Ratio: 3

## 2012-08-11 LAB — TSH: TSH: 1.1 u[IU]/mL (ref 0.35–5.50)

## 2012-08-11 LAB — PSA: PSA: 0.51 ng/mL (ref 0.10–4.00)

## 2012-08-16 ENCOUNTER — Other Ambulatory Visit: Payer: Self-pay | Admitting: *Deleted

## 2012-08-16 MED ORDER — LISINOPRIL-HYDROCHLOROTHIAZIDE 10-12.5 MG PO TABS: 1.0000 | ORAL_TABLET | Freq: Every day | ORAL | Status: DC

## 2012-08-16 MED ORDER — VERAPAMIL HCL ER 240 MG PO TBCR: 240.0000 mg | EXTENDED_RELEASE_TABLET | Freq: Every day | ORAL | Status: DC

## 2012-08-18 ENCOUNTER — Encounter: Payer: Self-pay | Admitting: Family Medicine

## 2012-08-18 ENCOUNTER — Ambulatory Visit (INDEPENDENT_AMBULATORY_CARE_PROVIDER_SITE_OTHER): Payer: 59 | Admitting: Family Medicine

## 2012-08-18 VITALS — BP 118/78 | HR 72 | Temp 98.5°F | Resp 12 | Ht 72.75 in | Wt 231.0 lb

## 2012-08-18 DIAGNOSIS — R319 Hematuria, unspecified: Secondary | ICD-10-CM

## 2012-08-18 DIAGNOSIS — Z0000 Encounter for general adult medical examination without abnormal findings: Secondary | ICD-10-CM

## 2012-08-18 LAB — URINALYSIS, ROUTINE W REFLEX MICROSCOPIC
Leukocytes, UA: NEGATIVE
Nitrite: NEGATIVE
Specific Gravity, Urine: 1.01 (ref 1.000–1.030)
Urobilinogen, UA: 0.2 (ref 0.0–1.0)

## 2012-08-18 LAB — POCT URINALYSIS DIPSTICK
Nitrite, UA: NEGATIVE
Urobilinogen, UA: 0.2
pH, UA: 7

## 2012-08-18 NOTE — Progress Notes (Signed)
Subjective:    Patient ID: Christopher Navarro, male    DOB: August 09, 1951, 62 y.o.   MRN: [id number]  HPI  Patient here for complete physical. Chronic problems include history of type 2 diabetes, hypertension, hyperlipidemia. He has some low back surgery over year ago and had some difficulties recovering from that. He is currently exercising regularly and has lost some weight and feels good overall. Recent fall with some abrasions to both hands and right knee but no secondary infection. No history of Pneumovax. No history of shingles vaccine. Tetanus up-to-date. Flu vaccine up to date.  Medications reviewed. Compliant with all. Not monitoring blood sugars regularly. No symptoms of hyperglycemia.  Colonoscopy 3 years ago. He plans to get followup this year  Past Medical History  Diagnosis Date  . HYPERTENSION 06/20/2009  . FASCIITIS, PLANTAR 06/20/2009  . 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 Dyslipidemia   . Diabetes mellitus type II    Past Surgical History  Procedure Date  . Knee arthroscopy 2002  . Carpal tunnel release 2004    Bilateral  . Ablasion 1997  . Back surgery 2006    reports that he has quit smoking. He has never used smokeless tobacco. He reports that he does not drink alcohol or use illicit drugs. family history includes Arthritis in his other; Hyperlipidemia in his other; Hypertension in his other; and Stroke in his mother. Allergies  Allergen Reactions  . Ciprofloxacin Rash    hives      Review of Systems  Constitutional: Negative for fever, activity change, appetite change, fatigue and unexpected weight change.  HENT: Negative for ear pain, congestion and trouble swallowing.   Eyes: Negative for pain and visual disturbance.  Respiratory: Negative for cough, shortness of breath and wheezing.   Cardiovascular: Negative for chest pain and palpitations.  Gastrointestinal: Negative for nausea, vomiting, abdominal pain, diarrhea,  constipation, blood in stool, abdominal distention and rectal pain.  Genitourinary: Negative for dysuria, hematuria and testicular pain.  Musculoskeletal: Negative for joint swelling and arthralgias.  Skin: Negative for rash.  Neurological: Negative for dizziness, syncope and headaches.  Hematological: Negative for adenopathy.  Psychiatric/Behavioral: Negative for confusion and dysphoric mood.       Objective:   Physical Exam  Constitutional: He is oriented to person, place, and time. He appears well-developed and well-nourished. No distress.  HENT:  Head: Normocephalic and atraumatic.  Right Ear: External ear normal.  Left Ear: External ear normal.  Mouth/Throat: Oropharynx is clear and moist.  Eyes: Conjunctivae normal and EOM are normal. Pupils are equal, round, and reactive to light.  Neck: Normal range of motion. Neck supple. No thyromegaly present.  Cardiovascular: Normal rate, regular rhythm and normal heart sounds.   No murmur heard. Pulmonary/Chest: No respiratory distress. He has no wheezes. He has no rales.  Abdominal: Soft. Bowel sounds are normal. He exhibits no distension and no mass. There is no tenderness. There is no rebound and no guarding.  Musculoskeletal: He exhibits no edema.  Lymphadenopathy:    He has no cervical adenopathy.  Neurological: He is alert and oriented to person, place, and time. He displays normal reflexes. No cranial nerve deficit.  Skin: No rash noted.       Patient has some abrasions dorsal aspect of several digits of the hands and also right anterior knee. No secondary infection  Psychiatric: He has a normal mood and affect.  Assessment & Plan:  Health maintenance. Pneumovax given. Check on coverage for shingles vaccine. Labs reviewed with patient. Trace blood on urine dipstick. Repeat urinalysis with trace blood-if persists urine micro-to further assess.

## 2012-08-18 NOTE — Patient Instructions (Addendum)
Check on coverage for shingles vaccine 

## 2012-09-20 ENCOUNTER — Other Ambulatory Visit: Payer: Self-pay | Admitting: Family Medicine

## 2013-01-31 ENCOUNTER — Other Ambulatory Visit: Payer: Self-pay | Admitting: Gastroenterology

## 2013-02-05 ENCOUNTER — Other Ambulatory Visit: Payer: Self-pay | Admitting: Family Medicine

## 2013-02-27 ENCOUNTER — Encounter: Payer: Self-pay | Admitting: Family Medicine

## 2013-04-10 ENCOUNTER — Other Ambulatory Visit: Payer: Self-pay | Admitting: Family Medicine

## 2013-08-28 ENCOUNTER — Ambulatory Visit (INDEPENDENT_AMBULATORY_CARE_PROVIDER_SITE_OTHER): Payer: BC Managed Care – PPO | Admitting: Family Medicine

## 2013-08-28 ENCOUNTER — Encounter: Payer: Self-pay | Admitting: Family Medicine

## 2013-08-28 VITALS — BP 120/80 | HR 70 | Temp 97.7°F | Ht 72.0 in | Wt 238.0 lb

## 2013-08-28 DIAGNOSIS — Z23 Encounter for immunization: Secondary | ICD-10-CM

## 2013-08-28 DIAGNOSIS — R5383 Other fatigue: Secondary | ICD-10-CM

## 2013-08-28 DIAGNOSIS — E785 Hyperlipidemia, unspecified: Secondary | ICD-10-CM

## 2013-08-28 DIAGNOSIS — I10 Essential (primary) hypertension: Secondary | ICD-10-CM

## 2013-08-28 DIAGNOSIS — R5381 Other malaise: Secondary | ICD-10-CM

## 2013-08-28 DIAGNOSIS — E669 Obesity, unspecified: Secondary | ICD-10-CM | POA: Insufficient documentation

## 2013-08-28 DIAGNOSIS — Z0000 Encounter for general adult medical examination without abnormal findings: Secondary | ICD-10-CM

## 2013-08-28 DIAGNOSIS — E119 Type 2 diabetes mellitus without complications: Secondary | ICD-10-CM

## 2013-08-28 LAB — BASIC METABOLIC PANEL
BUN: 14 mg/dL (ref 6–23)
CHLORIDE: 97 meq/L (ref 96–112)
CO2: 30 mEq/L (ref 19–32)
Calcium: 9.6 mg/dL (ref 8.4–10.5)
Creatinine, Ser: 0.9 mg/dL (ref 0.4–1.5)
GFR: 85.6 mL/min (ref >60.00)
Glucose, Bld: 112 mg/dL — ABNORMAL HIGH (ref 70–99)
POTASSIUM: 5.2 meq/L — AB (ref 3.5–5.1)
SODIUM: 135 meq/L (ref 135–145)

## 2013-08-28 LAB — CBC WITH DIFFERENTIAL/PLATELET
BASOS PCT: 0.6 % (ref 0.0–3.0)
Basophils Absolute: 0 10*3/uL (ref 0.0–0.1)
EOS PCT: 2 % (ref 0.0–5.0)
Eosinophils Absolute: 0.1 10*3/uL (ref 0.0–0.7)
HCT: 49.3 % (ref 39.0–52.0)
Hemoglobin: 16.6 g/dL (ref 13.0–17.0)
LYMPHS PCT: 24.6 % (ref 12.0–46.0)
Lymphs Abs: 1.8 10*3/uL (ref 0.7–4.0)
MCHC: 33.6 g/dL (ref 30.0–36.0)
MCV: 80.7 fl (ref 78.0–100.0)
MONOS PCT: 8.7 % (ref 3.0–12.0)
Monocytes Absolute: 0.6 10*3/uL (ref 0.1–1.0)
NEUTROS PCT: 64.1 % (ref 43.0–77.0)
Neutro Abs: 4.6 10*3/uL (ref 1.4–7.7)
PLATELETS: 337 10*3/uL (ref 150.0–400.0)
RBC: 6.12 Mil/uL — AB (ref 4.22–5.81)
RDW: 14.1 % (ref 11.5–14.6)
WBC: 7.3 10*3/uL (ref 4.5–10.5)

## 2013-08-28 LAB — HM DIABETES FOOT EXAM: HM Diabetic Foot Exam: NORMAL

## 2013-08-28 LAB — PSA: PSA: 0.53 ng/mL (ref 0.10–4.00)

## 2013-08-28 LAB — HEPATIC FUNCTION PANEL
ALBUMIN: 4.2 g/dL (ref 3.5–5.2)
ALT: 25 U/L (ref 0–53)
AST: 24 U/L (ref 0–37)
Alkaline Phosphatase: 76 U/L (ref 39–117)
BILIRUBIN DIRECT: 0.2 mg/dL (ref 0.0–0.3)
TOTAL PROTEIN: 7.4 g/dL (ref 6.0–8.3)
Total Bilirubin: 1.2 mg/dL (ref 0.3–1.2)

## 2013-08-28 LAB — HM DIABETES EYE EXAM

## 2013-08-28 LAB — HEMOGLOBIN A1C: HEMOGLOBIN A1C: 6.7 % — AB (ref 4.6–6.5)

## 2013-08-28 LAB — LIPID PANEL
CHOLESTEROL: 134 mg/dL (ref 0–200)
HDL: 48.6 mg/dL (ref >39.00)
LDL CALC: 67 mg/dL (ref 0–99)
TRIGLYCERIDES: 91 mg/dL (ref 0.0–149.0)
Total CHOL/HDL Ratio: 3
VLDL: 18.2 mg/dL (ref 0.0–40.0)

## 2013-08-28 LAB — TSH: TSH: 1.05 u[IU]/mL (ref 0.35–5.50)

## 2013-08-28 NOTE — Addendum Note (Signed)
Addended by: Marcina Millard on: 08/28/2013 09:58 AM   Modules accepted: Orders

## 2013-08-28 NOTE — Progress Notes (Signed)
Pre visit review using our clinic review tool, if applicable. No additional management support is needed unless otherwise documented below in the visit note. 

## 2013-08-28 NOTE — Patient Instructions (Signed)
Set up eye exam Continue with exercise and weight loss efforts.

## 2013-08-28 NOTE — Progress Notes (Signed)
Subjective:    Patient ID: Christopher Navarro, male    DOB: 10/15/1950, 63 y.o.   MRN: [id number]  HPI Patient seen for Medicare wellness exam and medical followup. He has history of obesity, type 2 diabetes, hyperlipidemia, and dyslipidemia. Medications reviewed and compliant with all. His blood sugars have been stable. Poor compliance in recent months with exercise and diet but he started back more consistent exercise past couple of weeks. He has not had shingles vaccine. He has not had pneumonia vaccine. Flu vaccine up-to-date. Tetanus up-to-date. Colonoscopy up to date.  He has some fatigue and malaise issues with exercise. No chest pains or dyspnea. He wonders if his testosterone levels may be low. He is undecided whether he wishes to test. He had good appetite. Nonsmoker.  Past Medical History  Diagnosis Date  . HYPERTENSION 06/20/2009  . [place], [place] 06/20/2009  . 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 Dyslipidemia   . Diabetes mellitus type II    Past Surgical History  Procedure Laterality Date  . Knee arthroscopy  2002  . Carpal tunnel release  2004    Bilateral  . Ablasion  1997  . Back surgery  2006    reports that he has quit smoking. He has never used smokeless tobacco. He reports that he does not drink alcohol or use illicit drugs. family history includes Arthritis in his other; Hyperlipidemia in his other; Hypertension in his other; Stroke in his mother. Allergies  Allergen Reactions  . Ciprofloxacin Rash    hives   1.  Risk factors based on Past Medical , Social, and Family history reviewed and as above 2.  Limitations in physical activities exercises regularly. Low-risk for fall 3.  Depression/mood no depression or anxiety issues 4.  Hearing intact 5.  ADLs independent in all 6.  Cognitive function (orientation to time and place, language, writing, speech,memory) memory and judgment intact. Language intact 7.  Home Safety no  issues identified 8.  Height, weight, and visual acuity. Recent mild weight gain. Needs to set up eye exam with history of diabetes 9.  Counseling discussed weight control and weight loss issues 10. Recommendation of preventive services. Prevnar 13 and shingles vaccine 11. Labs based on risk factors CBC, TSH, basic metabolic panel, hepatic panel, lipid panel, PSA, hemoglobin A1c 12. Care Plan as above    Review of Systems  Constitutional: Positive for fatigue. Negative for fever, activity change, appetite change and unexpected weight change.  HENT: Negative for congestion, ear pain and trouble swallowing.   Eyes: Negative for pain and visual disturbance.  Respiratory: Negative for cough, shortness of breath and wheezing.   Cardiovascular: Negative for chest pain and palpitations.  Gastrointestinal: Negative for nausea, vomiting, abdominal pain, diarrhea, constipation, blood in stool, abdominal distention and rectal pain.  Genitourinary: Negative for dysuria, hematuria and testicular pain.  Musculoskeletal: Negative for arthralgias and joint swelling.  Skin: Negative for rash.  Neurological: Negative for dizziness, syncope and headaches.  Hematological: Negative for adenopathy.  Psychiatric/Behavioral: Negative for confusion and dysphoric mood.       Objective:   Physical Exam  Constitutional: He is oriented to person, place, and time. He appears well-developed and well-nourished. No distress.  HENT:  Head: Normocephalic and atraumatic.  Right Ear: External ear normal.  Left Ear: External ear normal.  Mouth/Throat: Oropharynx is clear and moist.  Eyes: Conjunctivae and EOM are normal. Pupils are equal, round, and reactive to light.  Neck: Normal range of motion. Neck supple. No thyromegaly present.  Cardiovascular: Normal rate, regular rhythm and normal heart sounds.   No murmur heard. Pulmonary/Chest: No respiratory distress. He has no wheezes. He has no rales.  Abdominal: Soft.  Bowel sounds are normal. He exhibits no distension and no mass. There is no tenderness. There is no rebound and no guarding.  Musculoskeletal: He exhibits no edema.  Lymphadenopathy:    He has no cervical adenopathy.  Neurological: He is alert and oriented to person, place, and time. He displays normal reflexes. No cranial nerve deficit.  Skin: No rash noted.  Psychiatric: He has a normal mood and affect.          Assessment & Plan:  #1 complete physical. Prevnar 13 and shingles vaccine recommended. Flu vaccine already given. Colonoscopy up to date #2 type 2 diabetes. History of good control. Recheck A1c. Needs to set up eye exam #3 hyperlipidemia. Repeat lipid and hepatic panel #4 hypertension which is well controlled. Continue current medications #5 fatigue. Check labs including CBC and TSH.

## 2013-08-29 ENCOUNTER — Telehealth: Payer: Self-pay | Admitting: Family Medicine

## 2013-08-29 NOTE — Telephone Encounter (Signed)
Relevant patient education assigned to patient using Emmi. ° °

## 2013-09-20 ENCOUNTER — Other Ambulatory Visit: Payer: Self-pay | Admitting: Family Medicine

## 2013-10-19 ENCOUNTER — Other Ambulatory Visit: Payer: Self-pay | Admitting: Family Medicine

## 2013-10-23 ENCOUNTER — Other Ambulatory Visit: Payer: Self-pay | Admitting: Family Medicine

## 2014-09-14 ENCOUNTER — Other Ambulatory Visit (INDEPENDENT_AMBULATORY_CARE_PROVIDER_SITE_OTHER): Payer: Medicare Other

## 2014-09-14 DIAGNOSIS — I10 Essential (primary) hypertension: Secondary | ICD-10-CM

## 2014-09-14 DIAGNOSIS — E119 Type 2 diabetes mellitus without complications: Secondary | ICD-10-CM

## 2014-09-14 DIAGNOSIS — E785 Hyperlipidemia, unspecified: Secondary | ICD-10-CM

## 2014-09-14 DIAGNOSIS — Z0000 Encounter for general adult medical examination without abnormal findings: Secondary | ICD-10-CM

## 2014-09-14 DIAGNOSIS — Z125 Encounter for screening for malignant neoplasm of prostate: Secondary | ICD-10-CM

## 2014-09-14 LAB — CBC WITH DIFFERENTIAL/PLATELET
BASOS ABS: 0 10*3/uL (ref 0.0–0.1)
Basophils Relative: 0.5 % (ref 0.0–3.0)
Eosinophils Absolute: 0.1 10*3/uL (ref 0.0–0.7)
Eosinophils Relative: 1.7 % (ref 0.0–5.0)
HEMATOCRIT: 47.1 % (ref 39.0–52.0)
Hemoglobin: 15.9 g/dL (ref 13.0–17.0)
LYMPHS PCT: 24.5 % (ref 12.0–46.0)
Lymphs Abs: 2 10*3/uL (ref 0.7–4.0)
MCHC: 33.9 g/dL (ref 30.0–36.0)
MCV: 79.4 fl (ref 78.0–100.0)
Monocytes Absolute: 0.8 10*3/uL (ref 0.1–1.0)
Monocytes Relative: 9.6 % (ref 3.0–12.0)
Neutro Abs: 5.2 10*3/uL (ref 1.4–7.7)
Neutrophils Relative %: 63.7 % (ref 43.0–77.0)
Platelets: 380 10*3/uL (ref 150.0–400.0)
RBC: 5.93 Mil/uL — AB (ref 4.22–5.81)
RDW: 14.2 % (ref 11.5–15.5)
WBC: 8.1 10*3/uL (ref 4.0–10.5)

## 2014-09-14 LAB — HEPATIC FUNCTION PANEL
ALT: 18 U/L (ref 0–53)
AST: 17 U/L (ref 0–37)
Albumin: 4.3 g/dL (ref 3.5–5.2)
Alkaline Phosphatase: 80 U/L (ref 39–117)
BILIRUBIN DIRECT: 0.2 mg/dL (ref 0.0–0.3)
Total Bilirubin: 0.9 mg/dL (ref 0.2–1.2)
Total Protein: 7.4 g/dL (ref 6.0–8.3)

## 2014-09-14 LAB — MICROALBUMIN / CREATININE URINE RATIO
Creatinine,U: 65.6 mg/dL
Microalb Creat Ratio: 0.3 mg/g (ref 0.0–30.0)
Microalb, Ur: 0.2 mg/dL (ref 0.0–1.9)

## 2014-09-14 LAB — BASIC METABOLIC PANEL
BUN: 10 mg/dL (ref 6–23)
CO2: 31 mEq/L (ref 19–32)
Calcium: 9.6 mg/dL (ref 8.4–10.5)
Chloride: 97 mEq/L (ref 96–112)
Creatinine, Ser: 0.89 mg/dL (ref 0.40–1.50)
GFR: 90.87 mL/min (ref >60.00)
GLUCOSE: 128 mg/dL — AB (ref 70–99)
Potassium: 4.6 mEq/L (ref 3.5–5.1)
Sodium: 135 mEq/L (ref 135–145)

## 2014-09-14 LAB — HEMOGLOBIN A1C: Hgb A1c MFr Bld: 7.3 % — ABNORMAL HIGH (ref 4.6–6.5)

## 2014-09-14 LAB — TSH: TSH: 1.24 u[IU]/mL (ref 0.35–4.50)

## 2014-09-14 LAB — LIPID PANEL
CHOL/HDL RATIO: 2
Cholesterol: 113 mg/dL (ref 0–200)
HDL: 46.9 mg/dL (ref >39.00)
LDL CALC: 46 mg/dL (ref 0–99)
NonHDL: 66.1
TRIGLYCERIDES: 102 mg/dL (ref 0.0–149.0)
VLDL: 20.4 mg/dL (ref 0.0–40.0)

## 2014-09-14 LAB — PSA: PSA: 0.53 ng/mL (ref 0.10–4.00)

## 2014-09-18 ENCOUNTER — Encounter: Payer: Self-pay | Admitting: Family Medicine

## 2014-09-18 ENCOUNTER — Ambulatory Visit (INDEPENDENT_AMBULATORY_CARE_PROVIDER_SITE_OTHER): Payer: BLUE CROSS/BLUE SHIELD | Admitting: Family Medicine

## 2014-09-18 VITALS — BP 124/72 | HR 70 | Temp 97.8°F | Ht 72.0 in | Wt 241.0 lb

## 2014-09-18 DIAGNOSIS — E119 Type 2 diabetes mellitus without complications: Secondary | ICD-10-CM

## 2014-09-18 DIAGNOSIS — Z23 Encounter for immunization: Secondary | ICD-10-CM

## 2014-09-18 DIAGNOSIS — Z0000 Encounter for general adult medical examination without abnormal findings: Secondary | ICD-10-CM

## 2014-09-18 NOTE — Patient Instructions (Signed)
Lose some weight and get back to consistent exercise and let's plan repeat A1C in 3 months.

## 2014-09-18 NOTE — Addendum Note (Signed)
Addended by: Noe Gens E on: 09/18/2014 10:10 AM   Modules accepted: Orders

## 2014-09-18 NOTE — Progress Notes (Signed)
Subjective:    Patient ID: Christopher Navarro, male    DOB: 1951/04/25, 64 y.o.   MRN: [id number]  HPI Patient here for complete physical. He has history of obesity, hypertension, dyslipidemia, type 2 diabetes. Does not monitor blood sugars regularly. No symptoms of polyuria or polydipsia. Medications reviewed. He is on low-dose metformin 500 mg once daily. He has not had consistent exercise of the past couple months and poor dietary compliance. Nonsmoker. Plans to get back to gym more consistent diet and exercise soon. Immunizations are up-to-date. Colonoscopy up-to-date. Has not had shingles vaccine but he does wish to proceed with that today.  Past Medical History  Diagnosis Date  . HYPERTENSION 06/20/2009  . [place], [place] 06/20/2009  . 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 Dyslipidemia   . Diabetes mellitus type II    Past Surgical History  Procedure Laterality Date  . Knee arthroscopy  2002  . Carpal tunnel release  2004    Bilateral  . Ablasion  1997  . Back surgery  2006    reports that he has quit smoking. He has never used smokeless tobacco. He reports that he does not drink alcohol or use illicit drugs. family history includes Arthritis in his other; Hyperlipidemia in his other; Hypertension in his other; Stroke in his mother. Allergies  Allergen Reactions  . Ciprofloxacin Rash    hives      Review of Systems  Constitutional: Negative for fever, activity change, appetite change and fatigue.  HENT: Negative for congestion, ear pain and trouble swallowing.   Eyes: Negative for pain and visual disturbance.  Respiratory: Negative for cough, shortness of breath and wheezing.   Cardiovascular: Negative for chest pain and palpitations.  Gastrointestinal: Negative for nausea, vomiting, abdominal pain, diarrhea, constipation, blood in stool, abdominal distention and rectal pain.  Genitourinary: Negative for dysuria, hematuria and testicular  pain.  Musculoskeletal: Positive for back pain and arthralgias. Negative for joint swelling.  Skin: Negative for rash.  Neurological: Negative for dizziness, syncope and headaches.  Hematological: Negative for adenopathy.  Psychiatric/Behavioral: Negative for confusion and dysphoric mood.       Objective:   Physical Exam  Constitutional: He is oriented to person, place, and time. He appears well-developed and well-nourished. No distress.  HENT:  Head: Normocephalic and atraumatic.  Right Ear: External ear normal.  Left Ear: External ear normal.  Mouth/Throat: Oropharynx is clear and moist.  Eyes: Conjunctivae and EOM are normal. Pupils are equal, round, and reactive to light.  Neck: Normal range of motion. Neck supple. No thyromegaly present.  Cardiovascular: Normal rate, regular rhythm and normal heart sounds.   No murmur heard. Pulmonary/Chest: No respiratory distress. He has no wheezes. He has no rales.  Abdominal: Soft. Bowel sounds are normal. He exhibits no distension and no mass. There is no tenderness. There is no rebound and no guarding.  Musculoskeletal: He exhibits no edema.  Lymphadenopathy:    He has no cervical adenopathy.  Neurological: He is alert and oriented to person, place, and time. He displays normal reflexes. No cranial nerve deficit.  Skin: No rash noted.  Psychiatric: He has a normal mood and affect.          Assessment & Plan:  Complete physical. Shingles vaccine given. We discussed risk and benefits and patient consented. Other immunizations  up-to-date. Colonoscopy up-to-date. Labs reviewed. A1c 7.3%. We discussed options including increasing metformin and lifestyle modification and he wishes  to try three-month trial of weight loss and increased exercise and repeat A1c. If not to goal at that point increase metformin.

## 2014-09-18 NOTE — Progress Notes (Signed)
Pre visit review using our clinic review tool, if applicable. No additional management support is needed unless otherwise documented below in the visit note. 

## 2014-09-28 ENCOUNTER — Other Ambulatory Visit: Payer: Self-pay | Admitting: Family Medicine

## 2014-10-28 ENCOUNTER — Other Ambulatory Visit: Payer: Self-pay | Admitting: Family Medicine

## 2014-11-01 ENCOUNTER — Other Ambulatory Visit: Payer: Self-pay | Admitting: Family Medicine

## 2014-12-17 ENCOUNTER — Other Ambulatory Visit (INDEPENDENT_AMBULATORY_CARE_PROVIDER_SITE_OTHER): Payer: Medicare Other

## 2014-12-17 DIAGNOSIS — E119 Type 2 diabetes mellitus without complications: Secondary | ICD-10-CM

## 2014-12-17 LAB — HEMOGLOBIN A1C: HEMOGLOBIN A1C: 7 % — AB (ref 4.6–6.5)

## 2015-04-29 ENCOUNTER — Other Ambulatory Visit: Payer: Self-pay | Admitting: Family Medicine

## 2015-06-03 ENCOUNTER — Other Ambulatory Visit: Payer: Self-pay | Admitting: *Deleted

## 2015-06-03 MED ORDER — VERAPAMIL HCL ER 240 MG PO TBCR
240.0000 mg | EXTENDED_RELEASE_TABLET | Freq: Every day | ORAL | Status: DC
Start: 2015-06-03 — End: 2016-05-30

## 2015-06-03 MED ORDER — METFORMIN HCL 500 MG PO TABS: ORAL_TABLET | ORAL | Status: DC

## 2015-06-03 MED ORDER — LISINOPRIL-HYDROCHLOROTHIAZIDE 10-12.5 MG PO TABS: 1.0000 | ORAL_TABLET | Freq: Every day | ORAL | Status: DC

## 2015-07-31 ENCOUNTER — Other Ambulatory Visit: Payer: Self-pay | Admitting: Family Medicine

## 2015-10-09 ENCOUNTER — Other Ambulatory Visit (INDEPENDENT_AMBULATORY_CARE_PROVIDER_SITE_OTHER): Payer: Medicare Other

## 2015-10-09 DIAGNOSIS — Z0000 Encounter for general adult medical examination without abnormal findings: Secondary | ICD-10-CM

## 2015-10-09 LAB — MICROALBUMIN / CREATININE URINE RATIO
Creatinine,U: 151.4 mg/dL
MICROALB UR: 0.9 mg/dL (ref 0.0–1.9)
Microalb Creat Ratio: 0.6 mg/g (ref 0.0–30.0)

## 2015-10-09 LAB — CBC WITH DIFFERENTIAL/PLATELET
BASOS ABS: 0 10*3/uL (ref 0.0–0.1)
Basophils Relative: 0.5 % (ref 0.0–3.0)
Eosinophils Absolute: 0.2 10*3/uL (ref 0.0–0.7)
Eosinophils Relative: 3 % (ref 0.0–5.0)
HCT: 46.5 % (ref 39.0–52.0)
HEMOGLOBIN: 15.5 g/dL (ref 13.0–17.0)
LYMPHS PCT: 24.5 % (ref 12.0–46.0)
Lymphs Abs: 1.9 10*3/uL (ref 0.7–4.0)
MCHC: 33.4 g/dL (ref 30.0–36.0)
MCV: 81 fl (ref 78.0–100.0)
MONOS PCT: 8.2 % (ref 3.0–12.0)
Monocytes Absolute: 0.6 10*3/uL (ref 0.1–1.0)
NEUTROS ABS: 5 10*3/uL (ref 1.4–7.7)
Neutrophils Relative %: 63.8 % (ref 43.0–77.0)
Platelets: 359 10*3/uL (ref 150.0–400.0)
RBC: 5.74 Mil/uL (ref 4.22–5.81)
RDW: 14.2 % (ref 11.5–15.5)
WBC: 7.9 10*3/uL (ref 4.0–10.5)

## 2015-10-09 LAB — BASIC METABOLIC PANEL
BUN: 10 mg/dL (ref 6–23)
CALCIUM: 9.4 mg/dL (ref 8.4–10.5)
CO2: 30 meq/L (ref 19–32)
Chloride: 100 mEq/L (ref 96–112)
Creatinine, Ser: 0.82 mg/dL (ref 0.40–1.50)
GFR: 99.56 mL/min (ref >60.00)
Glucose, Bld: 139 mg/dL — ABNORMAL HIGH (ref 70–99)
Potassium: 4.9 mEq/L (ref 3.5–5.1)
SODIUM: 138 meq/L (ref 135–145)

## 2015-10-09 LAB — PSA: PSA: 0.56 ng/mL (ref 0.10–4.00)

## 2015-10-09 LAB — HEPATIC FUNCTION PANEL
ALBUMIN: 4.1 g/dL (ref 3.5–5.2)
ALK PHOS: 76 U/L (ref 39–117)
ALT: 17 U/L (ref 0–53)
AST: 18 U/L (ref 0–37)
BILIRUBIN DIRECT: 0.2 mg/dL (ref 0.0–0.3)
TOTAL PROTEIN: 6.9 g/dL (ref 6.0–8.3)
Total Bilirubin: 0.9 mg/dL (ref 0.2–1.2)

## 2015-10-09 LAB — TSH: TSH: 0.67 u[IU]/mL (ref 0.35–4.50)

## 2015-10-09 LAB — LIPID PANEL
CHOL/HDL RATIO: 3
Cholesterol: 131 mg/dL (ref 0–200)
HDL: 43.8 mg/dL (ref >39.00)
LDL Cholesterol: 59 mg/dL (ref 0–99)
NONHDL: 87.37
Triglycerides: 143 mg/dL (ref 0.0–149.0)
VLDL: 28.6 mg/dL (ref 0.0–40.0)

## 2015-10-09 LAB — HEMOGLOBIN A1C: HEMOGLOBIN A1C: 7.3 % — AB (ref 4.6–6.5)

## 2015-10-15 ENCOUNTER — Ambulatory Visit (INDEPENDENT_AMBULATORY_CARE_PROVIDER_SITE_OTHER): Payer: Medicare Other | Admitting: Family Medicine

## 2015-10-15 ENCOUNTER — Encounter: Payer: Self-pay | Admitting: Family Medicine

## 2015-10-15 VITALS — BP 128/80 | HR 72 | Temp 97.7°F | Ht 72.0 in | Wt 238.0 lb

## 2015-10-15 DIAGNOSIS — Z0000 Encounter for general adult medical examination without abnormal findings: Secondary | ICD-10-CM | POA: Diagnosis not present

## 2015-10-15 DIAGNOSIS — E119 Type 2 diabetes mellitus without complications: Secondary | ICD-10-CM

## 2015-10-15 DIAGNOSIS — Z23 Encounter for immunization: Secondary | ICD-10-CM | POA: Diagnosis not present

## 2015-10-15 NOTE — Progress Notes (Signed)
Pre visit review using our clinic review tool, if applicable. No additional management support is needed unless otherwise documented below in the visit note. 

## 2015-10-15 NOTE — Patient Instructions (Signed)
Set up eye exam Try to lose some weight. Let's plan to repeat A1C in 3 months.

## 2015-10-15 NOTE — Progress Notes (Signed)
   Subjective:    Patient ID: Christopher Navarro, male    DOB: 1950-12-01, 65 y.o.   MRN: [id number]  HPI   Patient here for physical exam.  His chronic problems include history of type 2 diabetes, obesity, hyperlipidemia, hypertension.  Blood sugars have been relatively stable. Does not monitor regularly. Medications reviewed. Compliant with all.  Immunizations reviewed. He needs Pneumovax. Otherwise immunizations are up-to-date. Patient has not had eye exam in over a year. Colonoscopy is up-to-date  has not been real consistent with exercise recently  Past Medical History  Diagnosis Date  . HYPERTENSION 06/20/2009  . [place], [place] 06/20/2009  . 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 Dyslipidemia   . Diabetes mellitus type II    Past Surgical History  Procedure Laterality Date  . Knee arthroscopy  2002  . Carpal tunnel release  2004    Bilateral  . Ablasion  1997  . Back surgery  2006    reports that he has quit smoking. He has never used smokeless tobacco. He reports that he does not drink alcohol or use illicit drugs. family history includes Arthritis in his other; Hyperlipidemia in his other; Hypertension in his other; Stroke in his mother. Allergies  Allergen Reactions  . Ciprofloxacin Rash    hives      Review of Systems  Constitutional: Negative for fever, activity change, appetite change and fatigue.  HENT: Negative for congestion, ear pain and trouble swallowing.   Eyes: Negative for pain and visual disturbance.  Respiratory: Negative for cough, shortness of breath and wheezing.   Cardiovascular: Negative for chest pain and palpitations.  Gastrointestinal: Negative for nausea, vomiting, abdominal pain, diarrhea, constipation, blood in stool, abdominal distention and rectal pain.  Genitourinary: Negative for dysuria, hematuria and testicular pain.  Musculoskeletal: Negative for joint swelling and arthralgias.  Skin: Negative for  rash.  Neurological: Negative for dizziness, syncope and headaches.  Hematological: Negative for adenopathy.  Psychiatric/Behavioral: Negative for confusion and dysphoric mood.       Objective:   Physical Exam  Constitutional: He is oriented to person, place, and time. He appears well-developed and well-nourished. No distress.  HENT:  Head: Normocephalic and atraumatic.  Right Ear: External ear normal.  Left Ear: External ear normal.  Mouth/Throat: Oropharynx is clear and moist.  Eyes: Conjunctivae and EOM are normal. Pupils are equal, round, and reactive to light.  Neck: Normal range of motion. Neck supple. No thyromegaly present.  Cardiovascular: Normal rate, regular rhythm and normal heart sounds.   No murmur heard. Pulmonary/Chest: No respiratory distress. He has no wheezes. He has no rales.  Abdominal: Soft. Bowel sounds are normal. He exhibits no distension and no mass. There is no tenderness. There is no rebound and no guarding.  Musculoskeletal: He exhibits no edema.  Lymphadenopathy:    He has no cervical adenopathy.  Neurological: He is alert and oriented to person, place, and time. He displays normal reflexes. No cranial nerve deficit.  Skin: No rash noted.  Multiple scattered seborrheic keratoses. No concerning lesions.  Psychiatric: He has a normal mood and affect.          Assessment & Plan:   Physical exam. Labs reviewed. A1c slightly elevated 7.3%. We discussed option of titrating metformin versus lifestyle modification and some weight loss and reassess 3 months and he prefers the latter. Pneumovax given. He is encouraged to set up eye exam.

## 2015-10-28 ENCOUNTER — Other Ambulatory Visit: Payer: Self-pay | Admitting: Family Medicine

## 2016-01-14 ENCOUNTER — Other Ambulatory Visit (INDEPENDENT_AMBULATORY_CARE_PROVIDER_SITE_OTHER): Payer: Medicare Other

## 2016-01-14 DIAGNOSIS — E119 Type 2 diabetes mellitus without complications: Secondary | ICD-10-CM

## 2016-01-14 LAB — HEMOGLOBIN A1C: Hgb A1c MFr Bld: 7.1 % — ABNORMAL HIGH (ref 4.6–6.5)

## 2016-01-30 ENCOUNTER — Other Ambulatory Visit: Payer: Self-pay | Admitting: Family Medicine

## 2016-02-10 ENCOUNTER — Encounter: Payer: Self-pay | Admitting: Family Medicine

## 2016-02-10 ENCOUNTER — Ambulatory Visit (INDEPENDENT_AMBULATORY_CARE_PROVIDER_SITE_OTHER): Payer: Medicare Other | Admitting: Family Medicine

## 2016-02-10 VITALS — BP 138/78 | HR 66 | Temp 98.2°F | Resp 16 | Ht 72.0 in | Wt 240.0 lb

## 2016-02-10 DIAGNOSIS — H578 Other specified disorders of eye and adnexa: Secondary | ICD-10-CM | POA: Diagnosis not present

## 2016-02-10 DIAGNOSIS — H5789 Other specified disorders of eye and adnexa: Secondary | ICD-10-CM

## 2016-02-10 MED ORDER — POLYMYXIN B-TRIMETHOPRIM 10000-0.1 UNIT/ML-% OP SOLN: 2.0000 [drp] | OPHTHALMIC | Status: DC

## 2016-02-10 NOTE — Patient Instructions (Signed)
Touch base in 2 days if eye symptoms no better and sooner for any blurred vision.

## 2016-02-10 NOTE — Progress Notes (Signed)
   Subjective:    Patient ID: Christopher Navarro, male    DOB: [birth year], 65 y.o.   MRN: [id number]  HPI Patient seen with right eye irritation. About one week ago he was sanding some redwood and noticed some mild irritation.   Since that time he's had some mild watering and it early morning crusted drainage. No visual changes. No significant eye pain. No contact use. Tried some over-the-counter drops without much improvement.  Past Medical History  Diagnosis Date  . HYPERTENSION 06/20/2009  . [place], [place] 06/20/2009  . 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 Dyslipidemia   . Diabetes mellitus type II    Past Surgical History  Procedure Laterality Date  . Knee arthroscopy  2002  . Carpal tunnel release  2004    Bilateral  . Ablasion  1997  . Back surgery  2006    reports that he has quit smoking. He has never used smokeless tobacco. He reports that he does not drink alcohol or use illicit drugs. family history includes Arthritis in his other; Hyperlipidemia in his other; Hypertension in his other; Stroke in his mother. Allergies  Allergen Reactions  . Ciprofloxacin Rash    hives      Review of Systems  Eyes: Positive for discharge. Negative for pain, redness and visual disturbance.       Objective:   Physical Exam  Constitutional: He appears well-developed and well-nourished.  Eyes: Pupils are equal, round, and reactive to light.  Right conjunctivae is slightly more erythematous compared to the left. No purulent drainage noted. Pupils equal round and reactive to light. Extraocular movements all normal Fluoroscein of the right eye-shows no evidence for corneal abrasion, ulcer, or foreign body.          Assessment & Plan:  Right eye irritation. No evidence for corneal abrasion, ulcer, or foreign body. May have some mild conjunctivitis. Start Polytrim ophthalmic drops. Touch base if symptoms not fully resolved in 2-3 days  [name] MD [facility]

## 2016-04-29 ENCOUNTER — Other Ambulatory Visit: Payer: Self-pay | Admitting: Family Medicine

## 2016-05-30 ENCOUNTER — Other Ambulatory Visit: Payer: Self-pay | Admitting: Family Medicine

## 2016-06-22 ENCOUNTER — Ambulatory Visit (INDEPENDENT_AMBULATORY_CARE_PROVIDER_SITE_OTHER): Payer: Medicare Other | Admitting: Family Medicine

## 2016-06-22 ENCOUNTER — Encounter: Payer: Self-pay | Admitting: Family Medicine

## 2016-06-22 VITALS — BP 132/72 | HR 64 | Temp 98.0°F | Ht 72.0 in | Wt 242.0 lb

## 2016-06-22 DIAGNOSIS — R1011 Right upper quadrant pain: Secondary | ICD-10-CM

## 2016-06-22 LAB — COMPREHENSIVE METABOLIC PANEL
ALK PHOS: 67 U/L (ref 39–117)
ALT: 21 U/L (ref 0–53)
AST: 19 U/L (ref 0–37)
Albumin: 4 g/dL (ref 3.5–5.2)
BUN: 7 mg/dL (ref 6–23)
CHLORIDE: 94 meq/L — AB (ref 96–112)
CO2: 32 meq/L (ref 19–32)
Calcium: 9.1 mg/dL (ref 8.4–10.5)
Creatinine, Ser: 0.89 mg/dL (ref 0.40–1.50)
GFR: 90.39 mL/min (ref >60.00)
GLUCOSE: 152 mg/dL — AB (ref 70–99)
POTASSIUM: 3.9 meq/L (ref 3.5–5.1)
SODIUM: 132 meq/L — AB (ref 135–145)
TOTAL PROTEIN: 7.2 g/dL (ref 6.0–8.3)
Total Bilirubin: 0.6 mg/dL (ref 0.2–1.2)

## 2016-06-22 LAB — CBC WITH DIFFERENTIAL/PLATELET
Basophils Absolute: 0 10*3/uL (ref 0.0–0.1)
Basophils Relative: 0.5 % (ref 0.0–3.0)
EOS PCT: 1.9 % (ref 0.0–5.0)
Eosinophils Absolute: 0.2 10*3/uL (ref 0.0–0.7)
HCT: 45.3 % (ref 39.0–52.0)
Hemoglobin: 15.2 g/dL (ref 13.0–17.0)
LYMPHS ABS: 1.8 10*3/uL (ref 0.7–4.0)
Lymphocytes Relative: 21.6 % (ref 12.0–46.0)
MCHC: 33.6 g/dL (ref 30.0–36.0)
MCV: 80.5 fl (ref 78.0–100.0)
MONO ABS: 0.7 10*3/uL (ref 0.1–1.0)
MONOS PCT: 8.2 % (ref 3.0–12.0)
NEUTROS ABS: 5.5 10*3/uL (ref 1.4–7.7)
NEUTROS PCT: 67.8 % (ref 43.0–77.0)
PLATELETS: 331 10*3/uL (ref 150.0–400.0)
RBC: 5.63 Mil/uL (ref 4.22–5.81)
RDW: 14.2 % (ref 11.5–15.5)
WBC: 8.2 10*3/uL (ref 4.0–10.5)

## 2016-06-22 NOTE — Progress Notes (Signed)
Subjective:     Patient ID: Christopher Navarro, male   DOB: Mar 04, 1951, 65 y.o.   MRN: [id number]  HPI Patient seen for an acute visit with right upper quadrant abdominal pain for the past couple weeks. His initial pain was actually right periscapular but since then his move more anterior. He has not seen any rash. He describes a "sharp "pain worse with movement. No nausea or vomiting. Does frequently feel bloated. Decreased appetite. No fevers or chills. No recent GERD symptoms. No chest pain. No pleuritic pain. No cough. No dyspnea.  He took some ibuprofen and heat with minimal relief. Denies recent injury. No prior history of gallstones. No worsening with food intake. Pain is mild at times and moderate at other times. Not waking from sleep. No recent stool changes.  Past Medical History:  Diagnosis Date  . 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 Diabetes mellitus type II   . Dyslipidemia   . [place], [place] 06/20/2009  . HYPERTENSION 06/20/2009   Past Surgical History:  Procedure Laterality Date  . ablasion  1997  . BACK SURGERY  2006  . CARPAL TUNNEL RELEASE  2004   Bilateral  . KNEE ARTHROSCOPY  2002    reports that he has quit smoking. He has never used smokeless tobacco. He reports that he does not drink alcohol or use drugs. family history includes Arthritis in his other; Hyperlipidemia in his other; Hypertension in his other; Stroke in his mother. Allergies  Allergen Reactions  . Ciprofloxacin Rash    hives  \   Review of Systems  Constitutional: Positive for appetite change. Negative for chills, fever and unexpected weight change.  HENT: Negative for trouble swallowing.   Respiratory: Negative for cough and shortness of breath.   Cardiovascular: Negative for chest pain, palpitations and leg swelling.  Gastrointestinal: Positive for abdominal pain.  Genitourinary: Negative for dysuria.  Skin: Negative for rash.  Neurological: Negative for  dizziness.       Objective:   Physical Exam  Constitutional: He appears well-developed and well-nourished. No distress.  Neck: Neck supple. No thyromegaly present.  Cardiovascular: Normal rate and regular rhythm.   Pulmonary/Chest: Effort normal and breath sounds normal. No respiratory distress. He has no wheezes. He has no rales.  Abdominal: Soft. Bowel sounds are normal. He exhibits no distension and no mass. There is no rebound and no guarding.  No hepatomegaly or splenomegaly. Minimal tenderness right upper quadrant to deep palpation.  Skin: No rash noted.       Assessment:     Abdominal pain right upper quadrant. Suspect this may be musculoskeletal with the fact this is worse with movement but he has had some progressive symptoms over 2 weeks    Plan:     -Check CBC and comprehensive metabolic panel -Set up ultrasound abdomen to further assess -Follow-up immediately for a fever, vomiting, worsening pain  [name] MD [facility] at [facility]

## 2016-06-22 NOTE — Patient Instructions (Signed)

## 2016-07-01 ENCOUNTER — Ambulatory Visit
Admission: RE | Admit: 2016-07-01 | Discharge: 2016-07-01 | Disposition: A | Discharge status: Home / Self Care | Payer: Medicare Other | Source: Ambulatory Visit | Attending: Family Medicine | Admitting: Family Medicine

## 2016-07-01 DIAGNOSIS — R1011 Right upper quadrant pain: Secondary | ICD-10-CM

## 2016-10-14 ENCOUNTER — Other Ambulatory Visit (INDEPENDENT_AMBULATORY_CARE_PROVIDER_SITE_OTHER): Payer: Medicare Other

## 2016-10-14 DIAGNOSIS — Z0000 Encounter for general adult medical examination without abnormal findings: Secondary | ICD-10-CM | POA: Diagnosis not present

## 2016-10-14 LAB — CBC WITH DIFFERENTIAL/PLATELET
BASOS PCT: 0.7 % (ref 0.0–3.0)
Basophils Absolute: 0 10*3/uL (ref 0.0–0.1)
EOS ABS: 0.2 10*3/uL (ref 0.0–0.7)
Eosinophils Relative: 2.4 % (ref 0.0–5.0)
HCT: 46 % (ref 39.0–52.0)
HEMOGLOBIN: 15.4 g/dL (ref 13.0–17.0)
LYMPHS ABS: 1.8 10*3/uL (ref 0.7–4.0)
Lymphocytes Relative: 25.1 % (ref 12.0–46.0)
MCHC: 33.5 g/dL (ref 30.0–36.0)
MCV: 81 fl (ref 78.0–100.0)
MONO ABS: 0.7 10*3/uL (ref 0.1–1.0)
Monocytes Relative: 9.2 % (ref 3.0–12.0)
NEUTROS PCT: 62.6 % (ref 43.0–77.0)
Neutro Abs: 4.5 10*3/uL (ref 1.4–7.7)
PLATELETS: 337 10*3/uL (ref 150.0–400.0)
RBC: 5.68 Mil/uL (ref 4.22–5.81)
RDW: 13.7 % (ref 11.5–15.5)
WBC: 7.2 10*3/uL (ref 4.0–10.5)

## 2016-10-14 LAB — BASIC METABOLIC PANEL
BUN: 9 mg/dL (ref 6–23)
CHLORIDE: 96 meq/L (ref 96–112)
CO2: 28 mEq/L (ref 19–32)
CREATININE: 0.84 mg/dL (ref 0.40–1.50)
Calcium: 9.4 mg/dL (ref 8.4–10.5)
GFR: 96.54 mL/min (ref >60.00)
GLUCOSE: 141 mg/dL — AB (ref 70–99)
Potassium: 4.2 mEq/L (ref 3.5–5.1)
Sodium: 133 mEq/L — ABNORMAL LOW (ref 135–145)

## 2016-10-14 LAB — LIPID PANEL
CHOL/HDL RATIO: 3
Cholesterol: 126 mg/dL (ref 0–200)
HDL: 43 mg/dL (ref >39.00)
LDL Cholesterol: 55 mg/dL (ref 0–99)
NonHDL: 83.01
Triglycerides: 140 mg/dL (ref 0.0–149.0)
VLDL: 28 mg/dL (ref 0.0–40.0)

## 2016-10-14 LAB — HEMOGLOBIN A1C: HEMOGLOBIN A1C: 7.8 % — AB (ref 4.6–6.5)

## 2016-10-14 LAB — MICROALBUMIN / CREATININE URINE RATIO
CREATININE, U: 87.9 mg/dL
MICROALB UR: 1.4 mg/dL (ref 0.0–1.9)
MICROALB/CREAT RATIO: 1.6 mg/g (ref 0.0–30.0)

## 2016-10-14 LAB — HEPATIC FUNCTION PANEL
ALBUMIN: 4.1 g/dL (ref 3.5–5.2)
ALT: 19 U/L (ref 0–53)
AST: 17 U/L (ref 0–37)
Alkaline Phosphatase: 72 U/L (ref 39–117)
BILIRUBIN TOTAL: 0.7 mg/dL (ref 0.2–1.2)
Bilirubin, Direct: 0.2 mg/dL (ref 0.0–0.3)
Total Protein: 6.8 g/dL (ref 6.0–8.3)

## 2016-10-14 LAB — TSH: TSH: 1.09 u[IU]/mL (ref 0.35–4.50)

## 2016-10-14 LAB — PSA: PSA: 0.48 ng/mL (ref 0.10–4.00)

## 2016-10-21 ENCOUNTER — Ambulatory Visit (INDEPENDENT_AMBULATORY_CARE_PROVIDER_SITE_OTHER): Payer: Medicare Other | Admitting: Family Medicine

## 2016-10-21 ENCOUNTER — Encounter: Payer: Self-pay | Admitting: Family Medicine

## 2016-10-21 VITALS — BP 118/70 | HR 60 | Temp 98.0°F | Ht 73.25 in | Wt 244.3 lb

## 2016-10-21 DIAGNOSIS — Z0000 Encounter for general adult medical examination without abnormal findings: Secondary | ICD-10-CM

## 2016-10-21 NOTE — Progress Notes (Signed)
Subjective:     Patient ID: Christopher Navarro, male   DOB: 01/17/1951, 66 y.o.   MRN: [id number]  HPI Patient seen for physical exam. He had poor compliance with diet and exercise over the wintertime. Not monitoring blood sugars regularly. He is overdue for eye exam. Medications reviewed. Compliant with all. Denies any side effects. No recent chest pains. He and his wife are planning a prolonged trip out [place] in their RV but plan to be back in about 3 months.  He hopes to get more exercise then.    Past Medical History:  Diagnosis Date  . 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 Diabetes mellitus type II   . Dyslipidemia   . [place], [place] 06/20/2009  . HYPERTENSION 06/20/2009   Past Surgical History:  Procedure Laterality Date  . ablasion  1997  . BACK SURGERY  2006  . CARPAL TUNNEL RELEASE  2004   Bilateral  . KNEE ARTHROSCOPY  2002    reports that he has quit smoking. He has never used smokeless tobacco. He reports that he does not drink alcohol or use drugs. family history includes Arthritis in his other; Hyperlipidemia in his other; Hypertension in his other; Stroke in his mother. Allergies  Allergen Reactions  . Ciprofloxacin Rash    hives     Review of Systems  Constitutional: Negative for activity change, appetite change, fatigue and fever.  HENT: Negative for congestion, ear pain and trouble swallowing.   Eyes: Negative for pain and visual disturbance.  Respiratory: Negative for cough, shortness of breath and wheezing.   Cardiovascular: Negative for chest pain and palpitations.  Gastrointestinal: Negative for abdominal distention, abdominal pain, blood in stool, constipation, diarrhea, nausea, rectal pain and vomiting.  Genitourinary: Negative for dysuria, hematuria and testicular pain.  Musculoskeletal: Negative for arthralgias and joint swelling.  Skin: Negative for rash.  Neurological: Negative for dizziness, syncope and headaches.   Hematological: Negative for adenopathy.  Psychiatric/Behavioral: Negative for confusion and dysphoric mood.       Objective:   Physical Exam  Constitutional: He is oriented to person, place, and time. He appears well-developed and well-nourished. No distress.  HENT:  Head: Normocephalic and atraumatic.  Right Ear: External ear normal.  Left Ear: External ear normal.  Mouth/Throat: Oropharynx is clear and moist.  Eyes: Conjunctivae and EOM are normal. Pupils are equal, round, and reactive to light.  Neck: Normal range of motion. Neck supple. No thyromegaly present.  Cardiovascular: Normal rate, regular rhythm and normal heart sounds.   No murmur heard. Pulmonary/Chest: No respiratory distress. He has no wheezes. He has no rales.  Abdominal: Soft. Bowel sounds are normal. He exhibits no distension and no mass. There is no tenderness. There is no rebound and no guarding.  Musculoskeletal: He exhibits no edema.  Lymphadenopathy:    He has no cervical adenopathy.  Neurological: He is alert and oriented to person, place, and time. He displays normal reflexes. No cranial nerve deficit.  Skin: No rash noted.  Psychiatric: He has a normal mood and affect.       Assessment:     Physical exam. Labs reviewed. Labs are excellent with the exception of hemoglobin A1c 7.8%    Plan:     -He is strongly encouraged to lose some weight and step up regular aerobic exercise -We discussed adding medication versus lifestyle management and weight loss and reassess A1c in 3 months and he prefers the latter. -consider  Jardiance as second medication if not improved at follow up vs GLP-1 class.  [name] MD [facility] at [facility]

## 2016-10-21 NOTE — Progress Notes (Signed)
Pre visit review using our clinic review tool, if applicable. No additional management support is needed unless otherwise documented below in the visit note. 

## 2016-10-21 NOTE — Patient Instructions (Signed)
Set up eye exam this year. Try to lose some weight Reduce sugars and starches.  Let's plan follow up in about 3 months

## 2016-10-24 ENCOUNTER — Other Ambulatory Visit: Payer: Self-pay | Admitting: Family Medicine

## 2017-05-27 ENCOUNTER — Other Ambulatory Visit: Payer: Self-pay | Admitting: Family Medicine

## 2017-08-22 ENCOUNTER — Other Ambulatory Visit: Payer: Self-pay | Admitting: Family Medicine

## 2017-10-14 ENCOUNTER — Other Ambulatory Visit: Payer: Self-pay | Admitting: Family Medicine

## 2017-11-14 ENCOUNTER — Other Ambulatory Visit: Payer: Self-pay | Admitting: Family Medicine

## 2018-01-06 ENCOUNTER — Other Ambulatory Visit: Payer: Self-pay | Admitting: Family Medicine

## 2018-02-08 ENCOUNTER — Other Ambulatory Visit: Payer: Self-pay | Admitting: Family Medicine

## 2018-03-03 LAB — HM DIABETES EYE EXAM

## 2018-03-04 ENCOUNTER — Other Ambulatory Visit: Payer: Self-pay | Admitting: Family Medicine

## 2018-03-08 ENCOUNTER — Encounter: Payer: Self-pay | Admitting: Family Medicine

## 2018-03-08 ENCOUNTER — Ambulatory Visit (INDEPENDENT_AMBULATORY_CARE_PROVIDER_SITE_OTHER): Payer: Medicare Other | Admitting: Family Medicine

## 2018-03-08 ENCOUNTER — Other Ambulatory Visit: Payer: Self-pay | Admitting: Family Medicine

## 2018-03-08 VITALS — BP 138/78 | HR 63 | Temp 98.3°F | Ht 73.0 in | Wt 240.2 lb

## 2018-03-08 DIAGNOSIS — Z0000 Encounter for general adult medical examination without abnormal findings: Secondary | ICD-10-CM

## 2018-03-08 DIAGNOSIS — L570 Actinic keratosis: Secondary | ICD-10-CM | POA: Diagnosis not present

## 2018-03-08 DIAGNOSIS — Z125 Encounter for screening for malignant neoplasm of prostate: Secondary | ICD-10-CM | POA: Diagnosis not present

## 2018-03-08 LAB — HEPATIC FUNCTION PANEL
ALK PHOS: 76 U/L (ref 39–117)
ALT: 24 U/L (ref 0–53)
AST: 26 U/L (ref 0–37)
Albumin: 4.3 g/dL (ref 3.5–5.2)
Bilirubin, Direct: 0.2 mg/dL (ref 0.0–0.3)
TOTAL PROTEIN: 7.3 g/dL (ref 6.0–8.3)
Total Bilirubin: 0.8 mg/dL (ref 0.2–1.2)

## 2018-03-08 LAB — BASIC METABOLIC PANEL
BUN: 8 mg/dL (ref 6–23)
CO2: 31 mEq/L (ref 19–32)
CREATININE: 0.89 mg/dL (ref 0.40–1.50)
Calcium: 9.6 mg/dL (ref 8.4–10.5)
Chloride: 91 mEq/L — ABNORMAL LOW (ref 96–112)
GFR: 89.93 mL/min (ref >60.00)
Glucose, Bld: 127 mg/dL — ABNORMAL HIGH (ref 70–99)
Potassium: 3.9 mEq/L (ref 3.5–5.1)
Sodium: 130 mEq/L — ABNORMAL LOW (ref 135–145)

## 2018-03-08 LAB — CBC WITH DIFFERENTIAL/PLATELET
BASOS PCT: 0.9 % (ref 0.0–3.0)
Basophils Absolute: 0.1 10*3/uL (ref 0.0–0.1)
EOS ABS: 0.2 10*3/uL (ref 0.0–0.7)
EOS PCT: 2 % (ref 0.0–5.0)
HEMATOCRIT: 46.1 % (ref 39.0–52.0)
HEMOGLOBIN: 15.6 g/dL (ref 13.0–17.0)
LYMPHS PCT: 25.9 % (ref 12.0–46.0)
Lymphs Abs: 2.2 10*3/uL (ref 0.7–4.0)
MCHC: 33.8 g/dL (ref 30.0–36.0)
MCV: 80.8 fl (ref 78.0–100.0)
Monocytes Absolute: 0.7 10*3/uL (ref 0.1–1.0)
Monocytes Relative: 8.5 % (ref 3.0–12.0)
NEUTROS PCT: 62.7 % (ref 43.0–77.0)
Neutro Abs: 5.3 10*3/uL (ref 1.4–7.7)
Platelets: 363 10*3/uL (ref 150.0–400.0)
RBC: 5.71 Mil/uL (ref 4.22–5.81)
RDW: 13.6 % (ref 11.5–15.5)
WBC: 8.4 10*3/uL (ref 4.0–10.5)

## 2018-03-08 LAB — LIPID PANEL
CHOLESTEROL: 124 mg/dL (ref 0–200)
HDL: 42.9 mg/dL (ref >39.00)
LDL Cholesterol: 44 mg/dL (ref 0–99)
NonHDL: 80.74
Total CHOL/HDL Ratio: 3
Triglycerides: 186 mg/dL — ABNORMAL HIGH (ref 0.0–149.0)
VLDL: 37.2 mg/dL (ref 0.0–40.0)

## 2018-03-08 LAB — PSA: PSA: 0.72 ng/mL (ref 0.10–4.00)

## 2018-03-08 LAB — HEMOGLOBIN A1C: Hgb A1c MFr Bld: 7.8 % — ABNORMAL HIGH (ref 4.6–6.5)

## 2018-03-08 LAB — TSH: TSH: 0.99 u[IU]/mL (ref 0.35–4.50)

## 2018-03-08 NOTE — Progress Notes (Signed)
Subjective:     Patient ID: Christopher Navarro, male   DOB: 1950-10-31, 67 y.o.   MRN: [id number]  HPI Patient seen for physical. Not been seen in over a year. He has chronic problems including obesity, type 2 diabetes, hyperlipidemia, hypertension. Not monitoring blood sugars regularly. Last A1c 7.8%. He's had previous pneumonia vaccines. Will be due for repeat colonoscopy 2024 and tetanus 2021. Previous Zostavax in 2016. No history of hepatitis C screening.  Scaly lesion dorsum left hand. Present for several years. No bleeding. Occasionally painful.  Exercises though somewhat inconsistently.    Wt Readings from Last 3 Encounters:  03/08/18 240 lb 3.2 oz (109 kg)  10/21/16 244 lb 4.8 oz (110.8 kg)  06/22/16 242 lb (109.8 kg)     Past Medical History:  Diagnosis Date  . 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 Diabetes mellitus type II   . Dyslipidemia   . [place], [place] 06/20/2009  . HYPERTENSION 06/20/2009   Past Surgical History:  Procedure Laterality Date  . ablasion  1997  . BACK SURGERY  2006  . CARPAL TUNNEL RELEASE  2004   Bilateral  . KNEE ARTHROSCOPY  2002    reports that he has quit smoking. He has never used smokeless tobacco. He reports that he does not drink alcohol or use drugs. family history includes Arthritis in his other; Hyperlipidemia in his other; Hypertension in his other; Stroke in his mother. Allergies  Allergen Reactions  . Ciprofloxacin Rash    hives     Review of Systems  Constitutional: Negative for activity change, appetite change, fatigue and fever.  HENT: Negative for congestion, ear pain and trouble swallowing.   Eyes: Negative for pain and visual disturbance.  Respiratory: Negative for cough, chest tightness, shortness of breath and wheezing.   Cardiovascular: Negative for chest pain, palpitations and leg swelling.  Gastrointestinal: Negative for abdominal distention, abdominal pain, blood in stool,  constipation, diarrhea, nausea, rectal pain and vomiting.  Endocrine: Negative for polydipsia and polyuria.  Genitourinary: Negative for dysuria, hematuria and testicular pain.  Musculoskeletal: Negative for arthralgias and joint swelling.  Neurological: Negative for dizziness, syncope, weakness, light-headedness and headaches.  Hematological: Negative for adenopathy.  Psychiatric/Behavioral: Negative for confusion and dysphoric mood.       Objective:   Physical Exam  Constitutional: He is oriented to person, place, and time. He appears well-developed and well-nourished. No distress.  HENT:  Head: Normocephalic and atraumatic.  Right Ear: External ear normal.  Left Ear: External ear normal.  Mouth/Throat: Oropharynx is clear and moist.  Eyes: Pupils are equal, round, and reactive to light. Conjunctivae and EOM are normal.  Neck: Normal range of motion. Neck supple. No thyromegaly present.  Cardiovascular: Normal rate, regular rhythm and normal heart sounds.  No murmur heard. Pulmonary/Chest: No respiratory distress. He has no wheezes. He has no rales.  Abdominal: Soft. Bowel sounds are normal. He exhibits no distension and no mass. There is no tenderness. There is no rebound and no guarding.  Musculoskeletal: He exhibits no edema.  Lymphadenopathy:    He has no cervical adenopathy.  Neurological: He is alert and oriented to person, place, and time. He displays normal reflexes. No cranial nerve deficit.  Skin:  She has thickened whitish hyperkeratotic lesion dorsum left hand in the spacing between the first and second digits. No ulceration.  Psychiatric: He has a normal mood and affect.       Assessment:     #  1 physical exam. Several health maintenance issues addressed as below  #2 type 2 diabetes overdue for follow-up A1c  #3 actinic keratosis dorsum left hand    Plan:     -Obtain lab work including A1c, hepatitis C antibody, lipid panel, hepatic panel, basic metabolic  panel, CBC, TSH, PSA -He is encouraged to lose some weight -Discuss new shingles vaccine and he will check on insurance coverage -Continue with yearly flu vaccine -We recommended treatment of actinic keratosis left hand. Discussed risk and benefits of liquid nitrogen therapy and patient consented. This was treated without difficulty and patient tolerated well -Discussed more consistent follow-up regarding his diabetes. We'll determine interval based on lab work above  Christopher Post MD [facility] at [facility]

## 2018-03-08 NOTE — Patient Instructions (Addendum)
Consider new shingles vaccine (Shingrix) and check with pharmacy if interested.   Actinic Keratosis An actinic keratosis is a precancerous growth on the skin. This means that it could develop into skin cancer if it is not treated. About 1% of these growths (actinic keratoses) turn into skin cancer within one year if they are not treated. It is important to have all of these growths evaluated to determine the best treatment approach. What are the causes? This condition is caused by getting too much ultraviolet (UV) radiation from the sun or other UV light sources. What increases the risk? The following factors may make you more likely to develop this condition:  Having light-colored skin and blue eyes.  Having blonde or red hair.  Spending a lot of time in the sun.  Inadequate skin protection when outdoors. This may include: ? Not using sunscreen properly. ? Not covering up skin that is exposed to sunlight.  Aging. The risk of developing an actinic keratosis increases with age.  What are the signs or symptoms? Actinic keratoses look like scaly, rough spots of skin.They can be as small as a pinhead or as big as a quarter. They may itch, hurt, or feel sensitive. In most cases, the growths become red. In some cases, they may be skin-colored, light tan, dark tan, pink, or a combination of any of these colors. There may be a small piece of pink or gray skin (skin tag) growing from the actinic keratosis. In some cases, it may be easier to notice actinic keratoses by feeling them, rather than seeing them. Actinic keratoses appear most often on areas of skin that get a lot of sun exposure, including the scalp, face, ears, lips, upper back, forearms, and the backs of the hands. Sometimes, actinic keratoses disappear, but many reappear a few days to a few weeks later. How is this diagnosed? This condition is usually diagnosed with a physical exam. A tissue sample may be removed from the actinic  keratosis and examined under a microscope (biopsy). How is this treated?  Treatment for this condition may include:  Scraping off the actinic keratosis (curettage).  Freezing the actinic keratosis with liquid nitrogen (cryosurgery). This causes the growth to eventually fall off the skin.  Applying medicated creams or gels to destroy the cells in the growth.  Applying chemicals to the actinic keratosis to make the outer layers of skin peel off (chemical peel).  Photodynamic therapy. In this procedure, medicated cream is applied to the actinic keratosis. This cream increases your skin's sensitivity to light. Then, a strong light is aimed at the actinic keratosis to destroy cells in the growth.  Follow these instructions at home: Skin care  Apply cool, wet cloths (cool compresses) to the affected areas.  Do not scratch your skin.  Check your skin regularly for any growths, especially growths that: ? Start to itch or bleed. ? Change in size, shape, or color. Caring for the treated area  Keep the treated area clean and dry as told by your health care provider.  Do not apply any medicine, cream, or lotion to the treated area unless your health care provider tells you to do that.  Do not pick at blisters or try to break them open. This can cause infection and scarring.  If you have red or irritated skin after treatment, follow instructions from your health care provider about how to take care of the treated area. Make sure you: ? Wash your hands with soap and water before  you change your bandage (dressing). If soap and water are not available, use hand sanitizer. ? Change your dressing as told by your health care provider.  If you have red or irritated skin after treatment, check your treated area every day for signs of infection. Check for: ? Swelling, pain, or more redness. ? Fluid or blood. ? Warmth. ? Pus or a bad smell. General instructions  Take over-the-counter and  prescription medicines only as told by your health care provider.  Return to your normal activities as told by your health care provider. Ask your health care provider what activities are safe for you.  Do not use any tobacco products, such as cigarettes, chewing tobacco, and e-cigarettes. If you need help quitting, ask your health care provider.  Have a skin exam done every year by a health care provider who is a skin conditions specialist (dermatologist).  Keep all follow-up visits as told by your health care provider. This is important. How is this prevented?  Do not get sunburns.  Try to avoid the sun between 10:00 a.m. and 4:00 p.m. This is when the UV light is the strongest.  Use a sunscreen or sunblock with SPF 30 (sun protection factor 30) or greater.  Apply sunscreen before you are exposed to sunlight, and reapply periodically as often as directed by the instructions on the sunscreen container.  Always wear sunglasses that have UV protection, and always wear hats and clothing to protect your skin from sunlight.  When possible, avoid medicines that increase your sensitivity to sunlight. These include: ? Certain antibiotic medicines. ? Certain water pills (diuretics). ? Certain prescription medicines that are used to treat acne (retinoids).  Do not use tanning beds or other indoor tanning devices. Contact a health care provider if:  You notice any changes or new growths on your skin.  You have swelling, pain, or more redness around your treated area.  You have fluid or blood coming from your treated area.  Your treated area feels warm to the touch.  You have pus or a bad smell coming from your treated area.  You have a fever.  You have a blister that becomes large and painful. This information is not intended to replace advice given to you by your health care provider. Make sure you discuss any questions you have with your health care provider. Document Released:  10/23/2008 Document Revised: 03/27/2016 Document Reviewed: 04/06/2015 Elsevier Interactive Patient Education  [employer].

## 2018-03-09 ENCOUNTER — Other Ambulatory Visit: Payer: Self-pay | Admitting: *Deleted

## 2018-03-09 ENCOUNTER — Encounter: Payer: Self-pay | Admitting: Family Medicine

## 2018-03-09 LAB — HEPATITIS C ANTIBODY
Hepatitis C Ab: NONREACTIVE
SIGNAL TO CUT-OFF: 0.01 (ref <1.00)

## 2018-03-09 MED ORDER — METFORMIN HCL 500 MG PO TABS: 500.0000 mg | ORAL_TABLET | Freq: Two times a day (BID) | ORAL | 0 refills | Status: DC

## 2018-04-04 ENCOUNTER — Other Ambulatory Visit: Payer: Self-pay | Admitting: Family Medicine

## 2018-04-06 ENCOUNTER — Other Ambulatory Visit: Payer: Self-pay | Admitting: Family Medicine

## 2018-05-04 ENCOUNTER — Other Ambulatory Visit: Payer: Self-pay | Admitting: Family Medicine

## 2018-07-05 ENCOUNTER — Ambulatory Visit (INDEPENDENT_AMBULATORY_CARE_PROVIDER_SITE_OTHER): Payer: Medicare Other | Admitting: Family Medicine

## 2018-07-05 ENCOUNTER — Encounter: Payer: Self-pay | Admitting: Family Medicine

## 2018-07-05 VITALS — BP 120/62 | HR 99 | Temp 97.7°F | Wt 232.0 lb

## 2018-07-05 DIAGNOSIS — I499 Cardiac arrhythmia, unspecified: Secondary | ICD-10-CM

## 2018-07-05 DIAGNOSIS — E119 Type 2 diabetes mellitus without complications: Secondary | ICD-10-CM | POA: Diagnosis not present

## 2018-07-05 DIAGNOSIS — I4891 Unspecified atrial fibrillation: Secondary | ICD-10-CM | POA: Diagnosis not present

## 2018-07-05 LAB — CBC WITH DIFFERENTIAL/PLATELET
BASOS PCT: 0.6 % (ref 0.0–3.0)
Basophils Absolute: 0 10*3/uL (ref 0.0–0.1)
EOS PCT: 1.2 % (ref 0.0–5.0)
Eosinophils Absolute: 0.1 10*3/uL (ref 0.0–0.7)
HEMATOCRIT: 48.7 % (ref 39.0–52.0)
HEMOGLOBIN: 16 g/dL (ref 13.0–17.0)
LYMPHS PCT: 29.4 % (ref 12.0–46.0)
Lymphs Abs: 2.1 10*3/uL (ref 0.7–4.0)
MCHC: 32.9 g/dL (ref 30.0–36.0)
MCV: 81.6 fl (ref 78.0–100.0)
Monocytes Absolute: 0.6 10*3/uL (ref 0.1–1.0)
Monocytes Relative: 8.3 % (ref 3.0–12.0)
Neutro Abs: 4.4 10*3/uL (ref 1.4–7.7)
Neutrophils Relative %: 60.5 % (ref 43.0–77.0)
Platelets: 317 10*3/uL (ref 150.0–400.0)
RBC: 5.96 Mil/uL — AB (ref 4.22–5.81)
RDW: 14.8 % (ref 11.5–15.5)
WBC: 7.3 10*3/uL (ref 4.0–10.5)

## 2018-07-05 LAB — BASIC METABOLIC PANEL
BUN: 13 mg/dL (ref 6–23)
CALCIUM: 9.9 mg/dL (ref 8.4–10.5)
CO2: 30 meq/L (ref 19–32)
Chloride: 98 mEq/L (ref 96–112)
Creatinine, Ser: 0.98 mg/dL (ref 0.40–1.50)
GFR: 80.39 mL/min (ref >60.00)
Glucose, Bld: 121 mg/dL — ABNORMAL HIGH (ref 70–99)
POTASSIUM: 4 meq/L (ref 3.5–5.1)
Sodium: 140 mEq/L (ref 135–145)

## 2018-07-05 LAB — HEMOGLOBIN A1C: Hgb A1c MFr Bld: 7 % — ABNORMAL HIGH (ref 4.6–6.5)

## 2018-07-05 LAB — TSH: TSH: 1.17 u[IU]/mL (ref 0.35–4.50)

## 2018-07-05 MED ORDER — APIXABAN 5 MG PO TABS: 5.0000 mg | ORAL_TABLET | Freq: Two times a day (BID) | ORAL | 1 refills | Status: DC

## 2018-07-05 NOTE — Patient Instructions (Signed)
Atrial Fibrillation Atrial fibrillation is a type of irregular or rapid heartbeat (arrhythmia). In atrial fibrillation, the heart quivers continuously in a chaotic pattern. This occurs when parts of the heart receive disorganized signals that make the heart unable to pump blood normally. This can increase the risk for stroke, heart failure, and other heart-related conditions. There are different types of atrial fibrillation, including:  Paroxysmal atrial fibrillation. This type starts suddenly, and it usually stops on its own shortly after it starts.  Persistent atrial fibrillation. This type often lasts longer than a week. It may stop on its own or with treatment.  Long-lasting persistent atrial fibrillation. This type lasts longer than 12 months.  Permanent atrial fibrillation. This type does not go away.  Talk with your health care provider to learn about the type of atrial fibrillation that you have. What are the causes? This condition is caused by some heart-related conditions or procedures, including:  A heart attack.  Coronary artery disease.  Heart failure.  Heart valve conditions.  High blood pressure.  Inflammation of the sac that surrounds the heart (pericarditis).  Heart surgery.  Certain heart rhythm disorders, such as Wolf-Parkinson-White syndrome.  Other causes include:  Pneumonia.  Obstructive sleep apnea.  Blockage of an artery in the lungs (pulmonary embolism, or PE).  Lung cancer.  Chronic lung disease.  Thyroid problems, especially if the thyroid is overactive (hyperthyroidism).  Caffeine.  Excessive alcohol use or illegal drug use.  Use of some medicines, including certain decongestants and diet pills.  Sometimes, the cause cannot be found. What increases the risk? This condition is more likely to develop in:  People who are older in age.  People who smoke.  People who have diabetes mellitus.  People who are overweight  (obese).  Athletes who exercise vigorously.  What are the signs or symptoms? Symptoms of this condition include:  A feeling that your heart is beating rapidly or irregularly.  A feeling of discomfort or pain in your chest.  Shortness of breath.  Sudden light-headedness or weakness.  Getting tired easily during exercise.  In some cases, there are no symptoms. How is this diagnosed? Your health care provider may be able to detect atrial fibrillation when taking your pulse. If detected, this condition may be diagnosed with:  An electrocardiogram (ECG).  A Holter monitor test that records your heartbeat patterns over a 24-hour period.  Transthoracic echocardiogram (TTE) to evaluate how blood flows through your heart.  Transesophageal echocardiogram (TEE) to view more detailed images of your heart.  A stress test.  Imaging tests, such as a CT scan or chest X-ray.  Blood tests.  How is this treated? The main goals of treatment are to prevent blood clots from forming and to keep your heart beating at a normal rate and rhythm. The type of treatment that you receive depends on many factors, such as your underlying medical conditions and how you feel when you are experiencing atrial fibrillation. This condition may be treated with:  Medicine to slow down the heart rate, bring the heart's rhythm back to normal, or prevent clots from forming.  Electrical cardioversion. This is a procedure that resets your heart's rhythm by delivering a controlled, low-energy shock to the heart through your skin.  Different types of ablation, such as catheter ablation, catheter ablation with pacemaker, or surgical ablation. These procedures destroy the heart tissues that send abnormal signals. When the pacemaker is used, it is placed under your skin to help your heart beat in   a regular rhythm.  Follow these instructions at home:  Take over-the counter and prescription medicines only as told by your  health care provider.  If your health care provider prescribed a blood-thinning medicine (anticoagulant), take it exactly as told. Taking too much blood-thinning medicine can cause bleeding. If you do not take enough blood-thinning medicine, you will not have the protection that you need against stroke and other problems.  Do not use tobacco products, including cigarettes, chewing tobacco, and e-cigarettes. If you need help quitting, ask your health care provider.  If you have obstructive sleep apnea, manage your condition as told by your health care provider.  Do not drink alcohol.  Do not drink beverages that contain caffeine, such as coffee, soda, and tea.  Maintain a healthy weight. Do not use diet pills unless your health care provider approves. Diet pills may make heart problems worse.  Follow diet instructions as told by your health care provider.  Exercise regularly as told by your health care provider.  Keep all follow-up visits as told by your health care provider. This is important. How is this prevented?  Avoid drinking beverages that contain caffeine or alcohol.  Avoid certain medicines, especially medicines that are used for breathing problems.  Avoid certain herbs and herbal medicines, such as those that contain ephedra or ginseng.  Do not use illegal drugs, such as cocaine and amphetamines.  Do not smoke.  Manage your high blood pressure. Contact a health care provider if:  You notice a change in the rate, rhythm, or strength of your heartbeat.  You are taking an anticoagulant and you notice increased bruising.  You tire more easily when you exercise or exert yourself. Get help right away if:  You have chest pain, abdominal pain, sweating, or weakness.  You feel nauseous.  You notice blood in your vomit, bowel movement, or urine.  You have shortness of breath.  You suddenly have swollen feet and ankles.  You feel dizzy.  You have sudden weakness or  numbness of the face, arm, or leg, especially on one side of the body.  You have trouble speaking, trouble understanding, or both (aphasia).  Your face or your eyelid droops on one side. These symptoms may represent a serious problem that is an emergency. Do not wait to see if the symptoms will go away. Get medical help right away. Call your local emergency services (911 in the [place] Do not drive yourself to the hospital. This information is not intended to replace advice given to you by your health care provider. Make sure you discuss any questions you have with your health care provider. Document Released: 07/27/2005 Document Revised: 12/04/2015 Document Reviewed: 11/21/2014 Elsevier Interactive Patient Education  2018 [employer]  

## 2018-07-05 NOTE — Progress Notes (Signed)
  Subjective:     Patient ID: Christopher Navarro, male   DOB: November 20, 1950, 67 y.o.   MRN: [id number]  HPI Here for diabetes follow-up.  He was seen for physical last summer.  Hemoglobin A1c 7.8%.  Was taking metformin only once daily and we increased this to 500 mg twice daily.  Recent fasting blood sugars around 125.  He has lost 8 pounds due to his efforts.  He is exercising about 90 minutes most days of the week.  Denies any recent chest pains or dizziness.  Other medication reviewed and compliant with all.  Blood pressure has been stable.  Positive family history of atrial fibrillation mother and brother.  Patient relates history of Wolff-Parkinson-White syndrome with ablation 1997.  Past Medical History:  Diagnosis Date  . 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 Diabetes mellitus type II   . Dyslipidemia   . Navarro, Christopher 06/20/2009  . HYPERTENSION 06/20/2009   Past Surgical History:  Procedure Laterality Date  . ablasion  1997  . BACK SURGERY  2006  . CARPAL TUNNEL RELEASE  2004   Bilateral  . KNEE ARTHROSCOPY  2002    reports that he has quit smoking. He has never used smokeless tobacco. He reports that he does not drink alcohol or use drugs. family history includes Arthritis in his other; Hyperlipidemia in his other; Hypertension in his other; Stroke in his mother. Allergies  Allergen Reactions  . Ciprofloxacin Rash    hives     Review of Systems  Constitutional: Negative for fatigue.  Eyes: Negative for visual disturbance.  Respiratory: Negative for cough, chest tightness and shortness of breath.   Cardiovascular: Negative for chest pain, palpitations and leg swelling.  Endocrine: Negative for polydipsia and polyuria.  Neurological: Negative for dizziness, syncope, weakness, light-headedness and headaches.       Objective:   Physical Exam  Constitutional: He is oriented to person, place, and time. He appears well-developed and  well-nourished.  HENT:  Right Ear: External ear normal.  Left Ear: External ear normal.  Mouth/Throat: Oropharynx is clear and moist.  Eyes: Pupils are equal, round, and reactive to light.  Neck: Neck supple. No thyromegaly present.  Cardiovascular: Normal rate.  Irregular heart rhythm noted  Pulmonary/Chest: Effort normal and breath sounds normal. No respiratory distress. He has no wheezes. He has no rales.  Musculoskeletal: He exhibits no edema.  Neurological: He is alert and oriented to person, place, and time.       Assessment:     #1 Type 2 diabetes.  Recent suboptimal control with A1c 7.8% last summer.  He has had some good success with weight loss and hopefully this will be reflected in improved A1c  #2 Irregular heart rhythm noted incidentally on exam-EKG does confirm atrial fibrillation with rate around 82.  CHA2DS2Vasc score of 3 (age, DM, htn)    Plan:     -Repeat hemoglobin A1c -Check EKG-as above -We will check other labs with TSH, basic metabolic panel, CBC -Patient has CHA2DS2VASc score of 3.  We have recommended anticoagulation with Eliquis 5 mg twice daily and set up cardiology referral.  He is already on verapamil.  We did not initiate beta-blocker since rate is controlled.  [name] MD [facility] at [facility]

## 2018-07-20 ENCOUNTER — Encounter: Payer: Self-pay | Admitting: Cardiovascular Disease

## 2018-07-20 ENCOUNTER — Other Ambulatory Visit: Payer: Self-pay

## 2018-07-20 ENCOUNTER — Ambulatory Visit (INDEPENDENT_AMBULATORY_CARE_PROVIDER_SITE_OTHER): Payer: Medicare Other | Admitting: Cardiovascular Disease

## 2018-07-20 VITALS — BP 138/75 | HR 52 | Ht 73.0 in | Wt 234.0 lb

## 2018-07-20 DIAGNOSIS — I4819 Other persistent atrial fibrillation: Secondary | ICD-10-CM

## 2018-07-20 NOTE — Patient Instructions (Addendum)
Medication Instructions:  Your physician recommends that you continue on your current medications as directed. Please refer to the Current Medication list given to you today.  If you need a refill on your cardiac medications before your next appointment, please call your pharmacy.   Lab work: NONE If you have labs (blood work) drawn today and your tests are completely normal, you will receive your results only by: Marland Kitchen MyChart Message (if you have MyChart) OR . A paper copy in the mail If you have any lab test that is abnormal or we need to change your treatment, we will call you to review the results.  Testing/Procedures: Your physician has requested that you have an echocardiogram. Echocardiography is a painless test that uses sound waves to create images of your heart. It provides your doctor with information about the size and shape of your heart and how well your heart's chambers and valves are working. This procedure takes approximately one hour. There are no restrictions for this procedure.  SCHEDULE WEEK OF 07/25/18  Follow-Up: At [facility], you and your health needs are our priority.  As part of our continuing mission to provide you with exceptional heart care, we have created designated Provider Care Teams.  These Care Teams include your primary Cardiologist (physician) and Advanced Practice Providers (APPs -  Physician Assistants and Nurse Practitioners) who all work together to provide you with the care you need, when you need it. You will need a follow up appointment in 3 months.  You may see DR. BERRY or one of the following Advanced Practice Providers on your designated Care Team:   Kerin Ransom, PA-C [place], [place] . Sande Rives, PA-C  SPECIAL INSTRUCTIONS:  REFERRAL TO [place]

## 2018-07-20 NOTE — Progress Notes (Signed)
07/20/2018 [name]   05/31/1951  [id number]  Primary Physician Burchette, Alinda Sierras, MD Primary Cardiologist: Lorretta Harp MD Renae Gloss  HPI:  Christopher Navarro is a 66 y.o. 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  He was referred by Dr. Elease Hashimoto for cardiovascular valuation because of newly recognized atrial fibrillation found on EKG 07/05/2018.  His history is notable for WPW having undergone ablation by Dr. Caryl Comes in 1997.  He has seen Dr. [name] in the past as well.  He has treated hypertension, diabetes and hyperlipidemia.  His brother did have bypass surgery.  He is never had a heart attack or stroke.  He denies chest pain but does have some dyspnea although he works out multiple times a week without limitation.  He was recently found to be in A. fib during a routine evaluation and was begun on Eliquis oral anticoagulation.  Current Meds  Medication Sig  . apixaban (ELIQUIS) 5 MG TABS tablet Take 1 tablet (5 mg total) by mouth 2 (two) times daily.  Marland Kitchen aspirin (GOODSENSE ASPIRIN) 81 MG chewable tablet Chew by mouth.  Marland Kitchen ibuprofen (ADVIL,MOTRIN) 200 MG tablet Take 200 mg by mouth every 6 (six) hours as needed.    Marland Kitchen lisinopril-hydrochlorothiazide (PRINZIDE,ZESTORETIC) 10-12.5 MG tablet Take 1 tablet by mouth every day  . metFORMIN (GLUCOPHAGE) 500 MG tablet Take 1 tablet (500 mg total) by mouth 2 (two) times daily with a meal. Please schedule an office visit for further refills. [phone]  . simvastatin (ZOCOR) 40 MG tablet TAKE 1 TABLET BY MOUTH EVERY DAY  . verapamil (CALAN-SR) 240 MG CR tablet TAKE 1 TABLET BY MOUTH EVERYDAY AT BEDTIME  . VERAPAMIL HCL ER, CO, PO Take by mouth.  . [DISCONTINUED] metFORMIN (GLUCOPHAGE) 500 MG tablet Take 1 tablet (500 mg total) by mouth 2 (two) times daily with a meal.     Allergies  Allergen Reactions  .  Ciprofloxacin Rash    hives    Social History   Socioeconomic History  . Marital status: Married    Spouse name: Not on file  . Number of children: Not on file  . Years of education: Not on file  . Highest education level: Not on file  Occupational History  . Not on file  Social Needs  . Financial resource strain: Not on file  . Food insecurity:    Worry: Not on file    Inability: Not on file  . Transportation needs:    Medical: Not on file    Non-medical: Not on file  Tobacco Use  . Smoking status: Former Research scientist (life sciences)  . Smokeless tobacco: Never Used  Substance and Sexual Activity  . Alcohol use: No  . Drug use: No  . Sexual activity: Not on file  Lifestyle  . Physical activity:    Days per week: Not on file    Minutes per session: Not on file  . Stress: Not on file  Relationships  . Social connections:    Talks on phone: Not on file    Gets together: Not on file    Attends religious service: Not on file    Active member of club or organization: Not on file    Attends meetings of clubs or organizations: Not on file    Relationship status: Not on file  . Intimate partner violence:    Fear of  current or ex partner: Not on file    Emotionally abused: Not on file    Physically abused: Not on file    Forced sexual activity: Not on file  Other Topics Concern  . Not on file  Social History Narrative  . Not on file     Review of Systems: General: negative for chills, fever, night sweats or weight changes.  Cardiovascular: negative for chest pain, dyspnea on exertion, edema, orthopnea, palpitations, paroxysmal nocturnal dyspnea or shortness of breath Dermatological: negative for rash Respiratory: negative for cough or wheezing Urologic: negative for hematuria Abdominal: negative for nausea, vomiting, diarrhea, bright red blood per rectum, melena, or hematemesis Neurologic: negative for visual changes, syncope, or dizziness All other systems reviewed and are otherwise  negative except as noted above.    Blood pressure 138/75, pulse (!) 52, height 6\' 1"  (1.854 m), weight 234 lb (106.1 kg).  General appearance: alert and no distress Neck: no adenopathy, no carotid bruit, no JVD, supple, symmetrical, trachea midline and thyroid not enlarged, symmetric, no tenderness/mass/nodules Lungs: clear to auscultation bilaterally Heart: irregularly irregular rhythm Extremities: extremities normal, atraumatic, no cyanosis or edema Pulses: 2+ and symmetric Skin: Skin color, texture, turgor normal. No rashes or lesions Neurologic: Alert and oriented X 3, normal strength and tone. Normal symmetric reflexes. Normal coordination and gait  EKG not performed today  ASSESSMENT AND PLAN:   Essential hypertension History of essential hypertension with blood pressure measured today at 138/75.  He is on verapamil, lisinopril and hydrochlorothiazide.  Dyslipidemia History of hyperlipidemia on simvastatin.  His most recent lipid profile performed 03/08/2018 revealed total cholesterol level of 124, LDL 44 and HDL of 42.  Persistent atrial fibrillation Mr. Rehberg was recently diagnosed with atrial fibrillation on 07/05/2018 by routine EKG.  He does mention decreased energy over the last month but denies chest pain or shortness of breath.  Exercises 4 to 5 days a week. This patients CHA2DS2-VASc Score and unadjusted Ischemic Stroke Rate (% per year) is equal to 3.2 % stroke rate/year from a score of 3.  He was begun on Eliquis by his PCP.  I am going to get a 2D echocardiogram on him and arrange for him to undergo elective outpatient cardioversion first week of January after he has been on his Eliquis for at least 4 weeks uninterrupted.  And then can refer him to the A. fib clinic for further treatment  Above score calculated as 1 point each if present [CHF, HTN, DM, Vascular=MI/PAD/Aortic Plaque, Age if 65-74, or Male] Above score calculated as 2 points each if present [Age > 75, or  Stroke/TIA/TE]       Lorretta Harp MD [facility], [facility] 07/20/2018 10:58 AM

## 2018-07-20 NOTE — Assessment & Plan Note (Signed)
History of essential hypertension with blood pressure measured today at 138/75.  He is on verapamil, lisinopril and hydrochlorothiazide.

## 2018-07-20 NOTE — Assessment & Plan Note (Signed)
Christopher Navarro was recently diagnosed with atrial fibrillation on 07/05/2018 by routine EKG.  He does mention decreased energy over the last month but denies chest pain or shortness of breath.  Exercises 4 to 5 days a week. This patients CHA2DS2-VASc Score and unadjusted Ischemic Stroke Rate (% per year) is equal to 3.2 % stroke rate/year from a score of 3.  He was begun on Eliquis by his PCP.  I am going to get a 2D echocardiogram on him and arrange for him to undergo elective outpatient cardioversion first week of January after he has been on his Eliquis for at least 4 weeks uninterrupted.  And then can refer him to the A. fib clinic for further treatment  Above score calculated as 1 point each if present [CHF, HTN, DM, Vascular=MI/PAD/Aortic Plaque, Age if 65-74, or Male] Above score calculated as 2 points each if present [Age > 75, or Stroke/TIA/TE]

## 2018-07-20 NOTE — Assessment & Plan Note (Signed)
History of hyperlipidemia on simvastatin.  His most recent lipid profile performed 03/08/2018 revealed total cholesterol level of 124, LDL 44 and HDL of 42.

## 2018-07-24 ENCOUNTER — Other Ambulatory Visit: Payer: Self-pay | Admitting: Family Medicine

## 2018-07-25 ENCOUNTER — Other Ambulatory Visit: Payer: Self-pay

## 2018-07-25 ENCOUNTER — Ambulatory Visit (HOSPITAL_COMMUNITY): Payer: Medicare Other | Attending: Cardiology

## 2018-07-25 DIAGNOSIS — I4819 Other persistent atrial fibrillation: Secondary | ICD-10-CM | POA: Insufficient documentation

## 2018-07-26 ENCOUNTER — Telehealth: Payer: Self-pay

## 2018-07-26 NOTE — Telephone Encounter (Signed)
Called pt with echo results; pt c/o of tiredness, fatigue, DOE, and of HR above 130's for past several days; pt wants to know if Gwenlyn Found, MD will consider moving up cardioversion already scheduled for 08/15/2018; pt states taking cardiac meds as prescribed; advised to continue on meds and that Dr. Gwenlyn Found will be made aware of pt concerns; pt verbalized understanding; will forward to Dr. Gwenlyn Found

## 2018-07-27 ENCOUNTER — Ambulatory Visit: Payer: Medicare Other | Admitting: Cardiovascular Disease

## 2018-07-28 ENCOUNTER — Encounter (HOSPITAL_COMMUNITY): Payer: Self-pay | Admitting: Nurse Practitioner

## 2018-07-28 ENCOUNTER — Ambulatory Visit (HOSPITAL_COMMUNITY)
Admission: RE | Admit: 2018-07-28 | Discharge: 2018-07-28 | Disposition: A | Discharge status: Home / Self Care | Payer: Medicare Other | Source: Ambulatory Visit | Attending: Nurse Practitioner | Admitting: Nurse Practitioner

## 2018-07-28 VITALS — BP 142/78 | HR 92 | Ht 73.0 in | Wt 234.5 lb

## 2018-07-28 DIAGNOSIS — I4819 Other persistent atrial fibrillation: Secondary | ICD-10-CM | POA: Diagnosis present

## 2018-07-28 DIAGNOSIS — Z8249 Family history of ischemic heart disease and other diseases of the circulatory system: Secondary | ICD-10-CM | POA: Insufficient documentation

## 2018-07-28 DIAGNOSIS — I10 Essential (primary) hypertension: Secondary | ICD-10-CM | POA: Insufficient documentation

## 2018-07-28 DIAGNOSIS — E785 Hyperlipidemia, unspecified: Secondary | ICD-10-CM | POA: Diagnosis not present

## 2018-07-28 DIAGNOSIS — E119 Type 2 diabetes mellitus without complications: Secondary | ICD-10-CM | POA: Insufficient documentation

## 2018-07-28 DIAGNOSIS — Z7984 Long term (current) use of oral hypoglycemic drugs: Secondary | ICD-10-CM | POA: Insufficient documentation

## 2018-07-28 DIAGNOSIS — Z9889 Other specified postprocedural states: Secondary | ICD-10-CM | POA: Diagnosis not present

## 2018-07-28 DIAGNOSIS — Z87891 Personal history of nicotine dependence: Secondary | ICD-10-CM | POA: Insufficient documentation

## 2018-07-28 DIAGNOSIS — Z7982 Long term (current) use of aspirin: Secondary | ICD-10-CM | POA: Diagnosis not present

## 2018-07-28 DIAGNOSIS — Z7901 Long term (current) use of anticoagulants: Secondary | ICD-10-CM | POA: Diagnosis not present

## 2018-07-28 DIAGNOSIS — Z881 Allergy status to other antibiotic agents status: Secondary | ICD-10-CM | POA: Insufficient documentation

## 2018-07-28 DIAGNOSIS — Z8349 Family history of other endocrine, nutritional and metabolic diseases: Secondary | ICD-10-CM | POA: Insufficient documentation

## 2018-07-28 DIAGNOSIS — Z79899 Other long term (current) drug therapy: Secondary | ICD-10-CM | POA: Diagnosis not present

## 2018-07-28 DIAGNOSIS — R9431 Abnormal electrocardiogram [ECG] [EKG]: Secondary | ICD-10-CM | POA: Diagnosis not present

## 2018-07-28 MED ORDER — DILTIAZEM HCL 30 MG PO TABS: ORAL_TABLET | ORAL | 1 refills | Status: DC

## 2018-07-28 NOTE — Patient Instructions (Signed)
Your physician has recommended you make the following change in your medication:  1)Cardizem 30mg  -- take 1 tablet every 4 hours AS NEEDED for AFIB heart rate >100 as long as top blood pressure >100.

## 2018-07-28 NOTE — Progress Notes (Signed)
Primary Care Physician: [name], MD Referring Physician: Dr. Ella Bodo is a 67 y.o. male with a h/o HTN, DM, that is in the afib clinic per Dr. Gwenlyn Found. He was found to be in afib on a return visit to [facility] when  irregular  heart beat was noted.He was started on eliquis 5 mg bid and continued on verapamil. He was then seen by Dr. Gwenlyn Found, echo obtained and he was set up for cardioversion 08/15/17.  In  the office today, he states that he does notice fatigue with afib. He does have spells when the heart beat is rapid.  Today, he denies symptoms of palpitations, chest pain, shortness of breath, orthopnea, PND, lower extremity edema, dizziness, presyncope, syncope, or neurologic sequela. The patient is tolerating medications without difficulties and is otherwise without complaint today.   Past Medical History:  Diagnosis Date  . 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 Diabetes mellitus type II   . Dyslipidemia   . [place], [place] 06/20/2009  . HYPERTENSION 06/20/2009   Past Surgical History:  Procedure Laterality Date  . ablasion  1997  . BACK SURGERY  2006  . CARPAL TUNNEL RELEASE  2004   Bilateral  . KNEE ARTHROSCOPY  2002    Current Outpatient Medications  Medication Sig Dispense Refill  . apixaban (ELIQUIS) 5 MG TABS tablet Take 1 tablet (5 mg total) by mouth 2 (two) times daily. 60 tablet 1  . aspirin (GOODSENSE ASPIRIN) 81 MG chewable tablet Chew by mouth.    Marland Kitchen lisinopril-hydrochlorothiazide (PRINZIDE,ZESTORETIC) 10-12.5 MG tablet Take 1 tablet by mouth every day 90 tablet 3  . metFORMIN (GLUCOPHAGE) 500 MG tablet Take 500 mg by mouth 2 (two) times daily with a meal.    . simvastatin (ZOCOR) 40 MG tablet TAKE 1 TABLET BY MOUTH EVERY DAY 90 tablet 2  . verapamil (CALAN-SR) 240 MG CR tablet TAKE 1 TABLET BY MOUTH EVERYDAY AT BEDTIME 90 tablet 3  . diltiazem (CARDIZEM) 30 MG tablet Take 1 tablet every 4 hours AS NEEDED for AFIB  heart rate >100 45 tablet 1  . ibuprofen (ADVIL,MOTRIN) 200 MG tablet Take 200 mg by mouth every 6 (six) hours as needed.       No current facility-administered medications for this encounter.     Allergies  Allergen Reactions  . Ciprofloxacin Rash    hives    Social History   Socioeconomic History  . Marital status: Married    Spouse name: Not on file  . Number of children: Not on file  . Years of education: Not on file  . Highest education level: Not on file  Occupational History  . Not on file  Social Needs  . Financial resource strain: Not on file  . Food insecurity:    Worry: Not on file    Inability: Not on file  . Transportation needs:    Medical: Not on file    Non-medical: Not on file  Tobacco Use  . Smoking status: Former Research scientist (life sciences)  . Smokeless tobacco: Never Used  Substance and Sexual Activity  . Alcohol use: No  . Drug use: No  . Sexual activity: Not on file  Lifestyle  . Physical activity:    Days per week: Not on file    Minutes per session: Not on file  . Stress: Not on file  Relationships  . Social connections:    Talks on phone: Not  on file    Gets together: Not on file    Attends religious service: Not on file    Active member of club or organization: Not on file    Attends meetings of clubs or organizations: Not on file    Relationship status: Not on file  . Intimate partner violence:    Fear of current or ex partner: Not on file    Emotionally abused: Not on file    Physically abused: Not on file    Forced sexual activity: Not on file  Other Topics Concern  . Not on file  Social History Narrative  . Not on file    Family History  Problem Relation Age of Onset  . Stroke Mother   . Arthritis Other   . Hyperlipidemia Other   . Hypertension Other     ROS- All systems are reviewed and negative except as per the HPI above  Physical Exam: Vitals:   07/28/18 1350  BP: (!) 142/78  Pulse: 92  Weight: 106.4 kg  Height: 6\' 1"  (1.854 m)    Wt Readings from Last 3 Encounters:  07/28/18 106.4 kg  07/20/18 106.1 kg  07/05/18 105.2 kg    Labs: Lab Results  Component Value Date   NA 140 07/05/2018   K 4.0 07/05/2018   CL 98 07/05/2018   CO2 30 07/05/2018   GLUCOSE 121 (H) 07/05/2018   BUN 13 07/05/2018   CREATININE 0.98 07/05/2018   CALCIUM 9.9 07/05/2018   No results found for: INR Lab Results  Component Value Date   CHOL 124 03/08/2018   HDL 42.90 03/08/2018   LDLCALC 44 03/08/2018   TRIG 186.0 (H) 03/08/2018     GEN- The patient is well appearing, alert and oriented x 3 today.   Head- normocephalic, atraumatic Eyes-  Sclera clear, conjunctiva pink Ears- hearing intact Oropharynx- clear Neck- supple, no JVP Lymph- no cervical lymphadenopathy Lungs- Clear to ausculation bilaterally, normal work of breathing Heart-irregular rate and rhythm, no murmurs, rubs or gallops, PMI not laterally displaced GI- soft, NT, ND, + BS Extremities- no clubbing, cyanosis, or edema MS- no significant deformity or atrophy Skin- no rash or lesion Psych- euthymic mood, full affect Neuro- strength and sensation are intact  EKG-afib with pvc's vrs aberrant beats at 92 bpm EchoStudy Conclusions  - Left ventricle: The cavity size was normal. There was mild   concentric hypertrophy. Systolic function was mildly to   moderately reduced. The estimated ejection fraction was in the   range of 40% to 45%. Diffuse hypokinesis. Features are consistent   with a pseudonormal left ventricular filling pattern, with   concomitant abnormal relaxation and increased filling pressure   (grade 2 diastolic dysfunction). Doppler parameters are   consistent with elevated ventricular end-diastolic filling   pressure. - Mitral valve: There was mild regurgitation. - Left atrium: The atrium was moderately dilated. - Right ventricle: The cavity size was normal. Wall thickness was   normal. Systolic function was normal. - Right atrium: The  atrium was normal in size. - Tricuspid valve: There was no regurgitation. - Pericardium, extracardiac: There was no pericardial effusion.  Impressions:  - No prior study available for comparison.-   Assessment and Plan: 1. New onset persist symptomatic afib General education re afib Triggers discussed Continue verapamil daily but will rx Cardizem 30 mg qd for episodes of fast HR  Continue eliquis 5 mg bid for CHA2DS2VASc score of  4, states no missed doses Cardioversion as scheduled 1/6,  risk vrs benefit discussed   F/u in afib clinic in one week s/p cardioversion  Butch Penny C. [facility] [address] [place], [place] [address] [phone]   Will see her

## 2018-07-28 NOTE — H&P (View-Only) (Signed)
Primary Care Physician: [name], MD Referring Physician: Dr. Ella Bodo is a 67 y.o. male with a h/o HTN, DM, that is in the afib clinic per Dr. Gwenlyn Found. He was found to be in afib on a return visit to [facility] when  irregular  heart beat was noted.He was started on eliquis 5 mg bid and continued on verapamil. He was then seen by Dr. Gwenlyn Found, echo obtained and he was set up for cardioversion 08/15/17.  In  the office today, he states that he does notice fatigue with afib. He does have spells when the heart beat is rapid.  Today, he denies symptoms of palpitations, chest pain, shortness of breath, orthopnea, PND, lower extremity edema, dizziness, presyncope, syncope, or neurologic sequela. The patient is tolerating medications without difficulties and is otherwise without complaint today.   Past Medical History:  Diagnosis Date  . 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 Diabetes mellitus type II   . Dyslipidemia   . [place], [place] 06/20/2009  . HYPERTENSION 06/20/2009   Past Surgical History:  Procedure Laterality Date  . ablasion  1997  . BACK SURGERY  2006  . CARPAL TUNNEL RELEASE  2004   Bilateral  . KNEE ARTHROSCOPY  2002    Current Outpatient Medications  Medication Sig Dispense Refill  . apixaban (ELIQUIS) 5 MG TABS tablet Take 1 tablet (5 mg total) by mouth 2 (two) times daily. 60 tablet 1  . aspirin (GOODSENSE ASPIRIN) 81 MG chewable tablet Chew by mouth.    Marland Kitchen lisinopril-hydrochlorothiazide (PRINZIDE,ZESTORETIC) 10-12.5 MG tablet Take 1 tablet by mouth every day 90 tablet 3  . metFORMIN (GLUCOPHAGE) 500 MG tablet Take 500 mg by mouth 2 (two) times daily with a meal.    . simvastatin (ZOCOR) 40 MG tablet TAKE 1 TABLET BY MOUTH EVERY DAY 90 tablet 2  . verapamil (CALAN-SR) 240 MG CR tablet TAKE 1 TABLET BY MOUTH EVERYDAY AT BEDTIME 90 tablet 3  . diltiazem (CARDIZEM) 30 MG tablet Take 1 tablet every 4 hours AS NEEDED for AFIB  heart rate >100 45 tablet 1  . ibuprofen (ADVIL,MOTRIN) 200 MG tablet Take 200 mg by mouth every 6 (six) hours as needed.       No current facility-administered medications for this encounter.     Allergies  Allergen Reactions  . Ciprofloxacin Rash    hives    Social History   Socioeconomic History  . Marital status: Married    Spouse name: Not on file  . Number of children: Not on file  . Years of education: Not on file  . Highest education level: Not on file  Occupational History  . Not on file  Social Needs  . Financial resource strain: Not on file  . Food insecurity:    Worry: Not on file    Inability: Not on file  . Transportation needs:    Medical: Not on file    Non-medical: Not on file  Tobacco Use  . Smoking status: Former Research scientist (life sciences)  . Smokeless tobacco: Never Used  Substance and Sexual Activity  . Alcohol use: No  . Drug use: No  . Sexual activity: Not on file  Lifestyle  . Physical activity:    Days per week: Not on file    Minutes per session: Not on file  . Stress: Not on file  Relationships  . Social connections:    Talks on phone: Not  on file    Gets together: Not on file    Attends religious service: Not on file    Active member of club or organization: Not on file    Attends meetings of clubs or organizations: Not on file    Relationship status: Not on file  . Intimate partner violence:    Fear of current or ex partner: Not on file    Emotionally abused: Not on file    Physically abused: Not on file    Forced sexual activity: Not on file  Other Topics Concern  . Not on file  Social History Narrative  . Not on file    Family History  Problem Relation Age of Onset  . Stroke Mother   . Arthritis Other   . Hyperlipidemia Other   . Hypertension Other     ROS- All systems are reviewed and negative except as per the HPI above  Physical Exam: Vitals:   07/28/18 1350  BP: (!) 142/78  Pulse: 92  Weight: 106.4 kg  Height: 6\' 1"  (1.854 m)    Wt Readings from Last 3 Encounters:  07/28/18 106.4 kg  07/20/18 106.1 kg  07/05/18 105.2 kg    Labs: Lab Results  Component Value Date   NA 140 07/05/2018   K 4.0 07/05/2018   CL 98 07/05/2018   CO2 30 07/05/2018   GLUCOSE 121 (H) 07/05/2018   BUN 13 07/05/2018   CREATININE 0.98 07/05/2018   CALCIUM 9.9 07/05/2018   No results found for: INR Lab Results  Component Value Date   CHOL 124 03/08/2018   HDL 42.90 03/08/2018   LDLCALC 44 03/08/2018   TRIG 186.0 (H) 03/08/2018     GEN- The patient is well appearing, alert and oriented x 3 today.   Head- normocephalic, atraumatic Eyes-  Sclera clear, conjunctiva pink Ears- hearing intact Oropharynx- clear Neck- supple, no JVP Lymph- no cervical lymphadenopathy Lungs- Clear to ausculation bilaterally, normal work of breathing Heart-irregular rate and rhythm, no murmurs, rubs or gallops, PMI not laterally displaced GI- soft, NT, ND, + BS Extremities- no clubbing, cyanosis, or edema MS- no significant deformity or atrophy Skin- no rash or lesion Psych- euthymic mood, full affect Neuro- strength and sensation are intact  EKG-afib with pvc's vrs aberrant beats at 92 bpm EchoStudy Conclusions  - Left ventricle: The cavity size was normal. There was mild   concentric hypertrophy. Systolic function was mildly to   moderately reduced. The estimated ejection fraction was in the   range of 40% to 45%. Diffuse hypokinesis. Features are consistent   with a pseudonormal left ventricular filling pattern, with   concomitant abnormal relaxation and increased filling pressure   (grade 2 diastolic dysfunction). Doppler parameters are   consistent with elevated ventricular end-diastolic filling   pressure. - Mitral valve: There was mild regurgitation. - Left atrium: The atrium was moderately dilated. - Right ventricle: The cavity size was normal. Wall thickness was   normal. Systolic function was normal. - Right atrium: The  atrium was normal in size. - Tricuspid valve: There was no regurgitation. - Pericardium, extracardiac: There was no pericardial effusion.  Impressions:  - No prior study available for comparison.-   Assessment and Plan: 1. New onset persist symptomatic afib General education re afib Triggers discussed Continue verapamil daily but will rx Cardizem 30 mg qd for episodes of fast HR  Continue eliquis 5 mg bid for CHA2DS2VASc score of  4, states no missed doses Cardioversion as scheduled 1/6,  risk vrs benefit discussed   F/u in afib clinic in one week s/p cardioversion  Christopher Navarro [facility] [address] [place], [place] [address] [phone]   Will see her

## 2018-07-29 NOTE — Telephone Encounter (Signed)
Left message for pt to call.

## 2018-07-29 NOTE — Telephone Encounter (Signed)
Spoke with pt, he is aware will need to wait to 08-15-18 and he is fine with that. He will continue with the diltiazem for elevated heart rates.

## 2018-07-29 NOTE — Telephone Encounter (Signed)
He can have his cardioversion anytime after he has been on Eliquis for at least 4 weeks uninterrupted.

## 2018-08-02 ENCOUNTER — Other Ambulatory Visit: Payer: Self-pay | Admitting: Family Medicine

## 2018-08-04 ENCOUNTER — Other Ambulatory Visit: Payer: Self-pay | Admitting: Internal Medicine

## 2018-08-14 NOTE — Anesthesia Preprocedure Evaluation (Addendum)
Anesthesia Evaluation  Patient identified by MRN, date of birth, ID band Patient awake    Reviewed: Allergy & Precautions, NPO status , Patient's Chart, lab work & pertinent test results  History of Anesthesia Complications Negative for: history of anesthetic complications  Airway Mallampati: III  TM Distance: >3 FB Neck ROM: Full    Dental  (+) Teeth Intact, Dental Advisory Given   Pulmonary former smoker,    Pulmonary exam normal breath sounds clear to auscultation       Cardiovascular hypertension, Pt. on medications Normal cardiovascular exam+ dysrhythmias Atrial Fibrillation  Rhythm:Regular Rate:Normal  TTE 12/19: EF 40-45%, mild LVH, diffuse hypokinesis, grade 2 diastolic dysfunction, mild MR, moderate LAE    Neuro/Psych negative neurological ROS     GI/Hepatic negative GI ROS, Neg liver ROS,   Endo/Other  diabetes, Type 2, Oral Hypoglycemic Agents  Renal/GU negative Renal ROS     Musculoskeletal  (+) Arthritis ,   Abdominal   Peds  Hematology negative hematology ROS (+)   Anesthesia Other Findings Day of surgery medications reviewed with the patient.  Reproductive/Obstetrics                            Anesthesia Physical Anesthesia Plan  ASA: III  Anesthesia Plan: General   Post-op Pain Management:    Induction: Intravenous  PONV Risk Score and Plan: 2 and Treatment may vary due to age or medical condition and Propofol infusion  Airway Management Planned: Mask  Additional Equipment:   Intra-op Plan:   Post-operative Plan:   Informed Consent: I have reviewed the patients History and Physical, chart, labs and discussed the procedure including the risks, benefits and alternatives for the proposed anesthesia with the patient or authorized representative who has indicated his/her understanding and acceptance.   Dental advisory given  Plan Discussed with:  CRNA  Anesthesia Plan Comments:        Anesthesia Quick Evaluation

## 2018-08-15 ENCOUNTER — Ambulatory Visit (HOSPITAL_COMMUNITY)
Admission: RE | Admit: 2018-08-15 | Discharge: 2018-08-15 | Disposition: A | Discharge status: Home / Self Care | Payer: Medicare Other | Attending: Internal Medicine | Admitting: Internal Medicine

## 2018-08-15 ENCOUNTER — Ambulatory Visit (HOSPITAL_COMMUNITY): Payer: Medicare Other | Admitting: Anesthesiology

## 2018-08-15 ENCOUNTER — Encounter (HOSPITAL_COMMUNITY): Payer: Self-pay | Admitting: Certified Registered"

## 2018-08-15 ENCOUNTER — Other Ambulatory Visit: Payer: Self-pay

## 2018-08-15 ENCOUNTER — Encounter (HOSPITAL_COMMUNITY)
Admission: RE | Disposition: A | Discharge status: Home / Self Care | Payer: Self-pay | Source: Home / Self Care | Attending: Internal Medicine

## 2018-08-15 DIAGNOSIS — I10 Essential (primary) hypertension: Secondary | ICD-10-CM | POA: Insufficient documentation

## 2018-08-15 DIAGNOSIS — I4819 Other persistent atrial fibrillation: Secondary | ICD-10-CM

## 2018-08-15 DIAGNOSIS — I4891 Unspecified atrial fibrillation: Secondary | ICD-10-CM | POA: Diagnosis present

## 2018-08-15 DIAGNOSIS — Z7982 Long term (current) use of aspirin: Secondary | ICD-10-CM | POA: Insufficient documentation

## 2018-08-15 DIAGNOSIS — Z8249 Family history of ischemic heart disease and other diseases of the circulatory system: Secondary | ICD-10-CM | POA: Insufficient documentation

## 2018-08-15 DIAGNOSIS — Z881 Allergy status to other antibiotic agents status: Secondary | ICD-10-CM | POA: Diagnosis not present

## 2018-08-15 DIAGNOSIS — Z87891 Personal history of nicotine dependence: Secondary | ICD-10-CM | POA: Insufficient documentation

## 2018-08-15 DIAGNOSIS — Z823 Family history of stroke: Secondary | ICD-10-CM | POA: Diagnosis not present

## 2018-08-15 DIAGNOSIS — Z7984 Long term (current) use of oral hypoglycemic drugs: Secondary | ICD-10-CM | POA: Diagnosis not present

## 2018-08-15 DIAGNOSIS — E785 Hyperlipidemia, unspecified: Secondary | ICD-10-CM | POA: Diagnosis not present

## 2018-08-15 DIAGNOSIS — E119 Type 2 diabetes mellitus without complications: Secondary | ICD-10-CM | POA: Diagnosis not present

## 2018-08-15 DIAGNOSIS — Z7901 Long term (current) use of anticoagulants: Secondary | ICD-10-CM | POA: Diagnosis not present

## 2018-08-15 DIAGNOSIS — Z79899 Other long term (current) drug therapy: Secondary | ICD-10-CM | POA: Insufficient documentation

## 2018-08-15 HISTORY — PX: CARDIOVERSION: SHX1299

## 2018-08-15 LAB — POCT I-STAT 4, (NA,K, GLUC, HGB,HCT)
Glucose, Bld: 141 mg/dL — ABNORMAL HIGH (ref 70–99)
HCT: 48 % (ref 39.0–52.0)
HEMOGLOBIN: 16.3 g/dL (ref 13.0–17.0)
Potassium: 4.3 mmol/L (ref 3.5–5.1)
SODIUM: 137 mmol/L (ref 135–145)

## 2018-08-15 SURGERY — CARDIOVERSION
Anesthesia: General

## 2018-08-15 MED ORDER — SODIUM CHLORIDE 0.9% FLUSH: 3.0000 mL | INTRAVENOUS | Status: DC | PRN

## 2018-08-15 MED ORDER — PROPOFOL 10 MG/ML IV BOLUS
INTRAVENOUS | Status: DC | PRN
  Administered 2018-08-15: 100 mg via INTRAVENOUS

## 2018-08-15 MED ORDER — LIDOCAINE 2% (20 MG/ML) 5 ML SYRINGE
INTRAMUSCULAR | Status: DC | PRN
  Administered 2018-08-15: 40 mg via INTRAVENOUS

## 2018-08-15 MED ORDER — SODIUM CHLORIDE 0.9% FLUSH: 3.0000 mL | Freq: Two times a day (BID) | INTRAVENOUS | Status: DC

## 2018-08-15 MED ORDER — SODIUM CHLORIDE 0.9 % IV SOLN
250.0000 mL | INTRAVENOUS | Status: DC
  Administered 2018-08-15: 09:00:00 via INTRAVENOUS

## 2018-08-15 NOTE — Interval H&P Note (Signed)
History and Physical Interval Note:  08/15/2018 8:51 AM  Christopher Navarro  has presented today for surgery, with the diagnosis of A-FIB  The various methods of treatment have been discussed with the patient and family. After consideration of risks, benefits and other options for treatment, the patient has consented to  Procedure(s): CARDIOVERSION (N/A) as a surgical intervention .  The patient's history has been reviewed, patient examined, no change in status, stable for surgery.  I have reviewed the patient's chart and labs.  Questions were answered to the patient's satisfaction.     [name]

## 2018-08-15 NOTE — Interval H&P Note (Signed)
History and Physical Interval Note:  08/15/2018 9:11 AM  Christopher Navarro  has presented today for surgery, with the diagnosis of A-FIB  The various methods of treatment have been discussed with the patient and family. After consideration of risks, benefits and other options for treatment, the patient has consented to  Procedure(s): CARDIOVERSION (N/A) as a surgical intervention .  The patient's history has been reviewed, patient examined, no change in status, stable for surgery.  I have reviewed the patient's chart and labs.  Questions were answered to the patient's satisfaction.     [name]

## 2018-08-15 NOTE — Discharge Instructions (Signed)
Electrical Cardioversion, Care After °This sheet gives you information about how to care for yourself after your procedure. Your health care provider may also give you more specific instructions. If you have problems or questions, contact your health care provider. °What can I expect after the procedure? °After the procedure, it is common to have: °· Some redness on the skin where the shocks were given. °Follow these instructions at home: ° °· Do not drive for 24 hours if you were given a medicine to help you relax (sedative). °· Take over-the-counter and prescription medicines only as told by your health care provider. °· Ask your health care provider how to check your pulse. Check it often. °· Rest for 48 hours after the procedure or as told by your health care provider. °· Avoid or limit your caffeine use as told by your health care provider. °Contact a health care provider if: °· You feel like your heart is beating too quickly or your pulse is not regular. °· You have a serious muscle cramp that does not go away. °Get help right away if: ° °· You have discomfort in your chest. °· You are dizzy or you feel faint. °· You have trouble breathing or you are short of breath. °· Your speech is slurred. °· You have trouble moving an arm or leg on one side of your body. °· Your fingers or toes turn cold or blue. °This information is not intended to replace advice given to you by your health care provider. Make sure you discuss any questions you have with your health care provider. °Document Released: 05/17/2013 Document Revised: 02/28/2016 Document Reviewed: 01/31/2016 °Elsevier Interactive Patient Education © 2019 [employer] Inc[employer] ° °

## 2018-08-15 NOTE — CV Procedure (Signed)
Cardioversion  Patient sedated by anesthesia with Propofol intravenously  With pads in the AP position, patient cardioverted to SR with 200 J synchronized biphasic energy  Procedure without complication  12 lead EKG pending.

## 2018-08-15 NOTE — Transfer of Care (Signed)
Immediate Anesthesia Transfer of Care Note  Patient: Christopher Navarro  Procedure(s) Performed: CARDIOVERSION (N/A )  Patient Location: Endoscopy Unit  Anesthesia Type:General  Level of Consciousness: oriented and drowsy  Airway & Oxygen Therapy: Patient Spontanous Breathing  Post-op Assessment: Report given to RN  Post vital signs: Reviewed and stable  Last Vitals:  Vitals Value Taken Time  BP    Temp    Pulse    Resp    SpO2      Last Pain:  Vitals:   08/15/18 0855  TempSrc: Oral  PainSc: 0-No pain         Complications: No apparent anesthesia complications

## 2018-08-15 NOTE — Anesthesia Postprocedure Evaluation (Signed)
Anesthesia Post Note  Patient: Christopher Navarro  Procedure(s) Performed: CARDIOVERSION (N/A )     Patient location during evaluation: PACU Anesthesia Type: General Level of consciousness: awake and alert Pain management: pain level controlled Vital Signs Assessment: post-procedure vital signs reviewed and stable Respiratory status: spontaneous breathing, nonlabored ventilation and respiratory function stable Cardiovascular status: blood pressure returned to baseline and stable Postop Assessment: no apparent nausea or vomiting Anesthetic complications: no    Last Vitals:  Vitals:   08/15/18 0855 08/15/18 0925  BP: 130/70 135/70  Pulse: 77 68  Resp: 18 (!) 23  Temp: 36.8 C 37.2 C  SpO2: 99% 95%    Last Pain:  Vitals:   08/15/18 0925  TempSrc: Oral  PainSc: 0-No pain                 Brennan Bailey

## 2018-08-17 ENCOUNTER — Encounter (HOSPITAL_COMMUNITY): Payer: Self-pay | Admitting: Internal Medicine

## 2018-08-23 ENCOUNTER — Encounter (HOSPITAL_COMMUNITY): Payer: Self-pay | Admitting: Nurse Practitioner

## 2018-08-23 ENCOUNTER — Ambulatory Visit (HOSPITAL_COMMUNITY)
Admission: RE | Admit: 2018-08-23 | Discharge: 2018-08-23 | Disposition: A | Discharge status: Home / Self Care | Payer: Medicare Other | Source: Ambulatory Visit | Attending: Nurse Practitioner | Admitting: Nurse Practitioner

## 2018-08-23 VITALS — BP 106/64 | HR 66 | Ht 73.0 in | Wt 230.4 lb

## 2018-08-23 DIAGNOSIS — I10 Essential (primary) hypertension: Secondary | ICD-10-CM | POA: Insufficient documentation

## 2018-08-23 DIAGNOSIS — E785 Hyperlipidemia, unspecified: Secondary | ICD-10-CM | POA: Diagnosis not present

## 2018-08-23 DIAGNOSIS — Z7901 Long term (current) use of anticoagulants: Secondary | ICD-10-CM | POA: Insufficient documentation

## 2018-08-23 DIAGNOSIS — Z7984 Long term (current) use of oral hypoglycemic drugs: Secondary | ICD-10-CM | POA: Diagnosis not present

## 2018-08-23 DIAGNOSIS — I4891 Unspecified atrial fibrillation: Secondary | ICD-10-CM | POA: Diagnosis present

## 2018-08-23 DIAGNOSIS — E118 Type 2 diabetes mellitus with unspecified complications: Secondary | ICD-10-CM | POA: Diagnosis not present

## 2018-08-23 DIAGNOSIS — Z87891 Personal history of nicotine dependence: Secondary | ICD-10-CM | POA: Insufficient documentation

## 2018-08-23 DIAGNOSIS — I4819 Other persistent atrial fibrillation: Secondary | ICD-10-CM

## 2018-08-23 DIAGNOSIS — Z79899 Other long term (current) drug therapy: Secondary | ICD-10-CM | POA: Insufficient documentation

## 2018-08-23 MED ORDER — SIMVASTATIN 10 MG PO TABS: 10.0000 mg | ORAL_TABLET | Freq: Every day | ORAL | 3 refills | Status: DC

## 2018-08-23 NOTE — Addendum Note (Signed)
Encounter addended by: Sherran Needs, NP on: 08/23/2018 2:35 PM  Actions taken: Clinical Note Signed

## 2018-08-23 NOTE — Progress Notes (Addendum)
Primary Care Physician: [name], MD Referring Physician: Dr. Ella Bodo is a 68 y.o. male with a h/o HTN, DM, that is in the afib clinic per Dr. Gwenlyn Found. He was found to be in afib on a return visit to [facility] when  irregular  heart beat was noted.He was started on eliquis 5 mg bid and continued on verapamil. He was then seen by Dr. Gwenlyn Found, echo obtained and he was set up for cardioversion 08/15/17.   Unfortunately, he had ERAF, after successful cardioversion. He was in SR for several days and could tell that he felt so much better. He is interested in pursing AAD.   Today, he denies symptoms of palpitations, chest pain, shortness of breath, orthopnea, PND, lower extremity edema, dizziness, presyncope, syncope, or neurologic sequela. The patient is tolerating medications without difficulties and is otherwise without complaint today.   Past Medical History:  Diagnosis Date  . 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 Diabetes mellitus type II   . Dyslipidemia   . [place], [place] 06/20/2009  . HYPERTENSION 06/20/2009   Past Surgical History:  Procedure Laterality Date  . ablasion  1997  . BACK SURGERY  2006  . CARDIOVERSION N/A 08/15/2018   Procedure: CARDIOVERSION;  Surgeon: Fay Records, MD;  Location: [facility] ENDOSCOPY;  Service: Cardiovascular;  Laterality: N/A;  . CARPAL TUNNEL RELEASE  2004   Bilateral  . KNEE ARTHROSCOPY  2002    Current Outpatient Medications  Medication Sig Dispense Refill  . diltiazem (CARDIZEM) 30 MG tablet Take 1 tablet every 4 hours AS NEEDED for AFIB heart rate >100 45 tablet 1  . ELIQUIS 5 MG TABS tablet TAKE 1 TABLET BY MOUTH TWICE A DAY 60 tablet 5  . ibuprofen (ADVIL,MOTRIN) 200 MG tablet Take 400 mg by mouth 3 (three) times daily as needed for moderate pain.     Marland Kitchen lisinopril-hydrochlorothiazide (PRINZIDE,ZESTORETIC) 10-12.5 MG tablet Take 1 tablet by mouth every day 90 tablet 3  . metFORMIN (GLUCOPHAGE) 500  MG tablet Take 500 mg by mouth 2 (two) times daily with a meal.    . Multiple Vitamin (MULTIVITAMIN WITH MINERALS) TABS tablet Take 1 tablet by mouth daily.    . simvastatin (ZOCOR) 10 MG tablet Take 1 tablet (10 mg total) by mouth daily. 30 tablet 3  . verapamil (CALAN-SR) 240 MG CR tablet TAKE 1 TABLET BY MOUTH EVERYDAY AT BEDTIME (Patient taking differently: Take 240 mg by mouth every evening. ) 90 tablet 3   No current facility-administered medications for this encounter.     Allergies  Allergen Reactions  . Ciprofloxacin Hives and Rash    Social History   Socioeconomic History  . Marital status: Married    Spouse name: Not on file  . Number of children: Not on file  . Years of education: Not on file  . Highest education level: Not on file  Occupational History  . Not on file  Social Needs  . Financial resource strain: Not on file  . Food insecurity:    Worry: Not on file    Inability: Not on file  . Transportation needs:    Medical: Not on file    Non-medical: Not on file  Tobacco Use  . Smoking status: Former Research scientist (life sciences)  . Smokeless tobacco: Never Used  Substance and Sexual Activity  . Alcohol use: No  . Drug use: No  . Sexual activity: Not on file  Lifestyle  . Physical activity:    Days per week: Not on file    Minutes per session: Not on file  . Stress: Not on file  Relationships  . Social connections:    Talks on phone: Not on file    Gets together: Not on file    Attends religious service: Not on file    Active member of club or organization: Not on file    Attends meetings of clubs or organizations: Not on file    Relationship status: Not on file  . Intimate partner violence:    Fear of current or ex partner: Not on file    Emotionally abused: Not on file    Physically abused: Not on file    Forced sexual activity: Not on file  Other Topics Concern  . Not on file  Social History Narrative  . Not on file    Family History  Problem Relation Age of  Onset  . Arthritis Other   . Hyperlipidemia Other   . Hypertension Other   . Stroke Mother     ROS- All systems are reviewed and negative except as per the HPI above  Physical Exam: Vitals:   08/23/18 0903  BP: 106/64  Pulse: 66  Weight: 104.5 kg  Height: 6\' 1"  (1.854 m)   Wt Readings from Last 3 Encounters:  08/23/18 104.5 kg  08/15/18 106.4 kg  07/28/18 106.4 kg    Labs: Lab Results  Component Value Date   NA 137 08/15/2018   K 4.3 08/15/2018   CL 98 07/05/2018   CO2 30 07/05/2018   GLUCOSE 141 (H) 08/15/2018   BUN 13 07/05/2018   CREATININE 0.98 07/05/2018   CALCIUM 9.9 07/05/2018   No results found for: INR Lab Results  Component Value Date   CHOL 124 03/08/2018   HDL 42.90 03/08/2018   LDLCALC 44 03/08/2018   TRIG 186.0 (H) 03/08/2018     GEN- The patient is well appearing, alert and oriented x 3 today.   Head- normocephalic, atraumatic Eyes-  Sclera clear, conjunctiva pink Ears- hearing intact Oropharynx- clear Neck- supple, no JVP Lymph- no cervical lymphadenopathy Lungs- Clear to ausculation bilaterally, normal work of breathing Heart-irregular rate and rhythm, no murmurs, rubs or gallops, PMI not laterally displaced GI- soft, NT, ND, + BS Extremities- no clubbing, cyanosis, or edema MS- no significant deformity or atrophy Skin- no rash or lesion Psych- euthymic mood, full affect Neuro- strength and sensation are intact  EKG-afib with pvc's vrs aberrant beats at 92 bpm EchoStudy Conclusions  - Left ventricle: The cavity size was normal. There was mild   concentric hypertrophy. Systolic function was mildly to   moderately reduced. The estimated ejection fraction was in the   range of 40% to 45%. Diffuse hypokinesis. Features are consistent   with a pseudonormal left ventricular filling pattern, with   concomitant abnormal relaxation and increased filling pressure   (grade 2 diastolic dysfunction). Doppler parameters are   consistent with  elevated ventricular end-diastolic filling   pressure. - Mitral valve: There was mild regurgitation. - Left atrium: The atrium was moderately dilated. - Right ventricle: The cavity size was normal. Wall thickness was   normal. Systolic function was normal. - Right atrium: The atrium was normal in size. - Tricuspid valve: There was no regurgitation. - Pericardium, extracardiac: There was no pericardial effusion.  Impressions:  - No prior study available for comparison.-   Assessment and Plan: 1. New onset persist symptomatic afib  Successful cardioversion but ERAF Felt better in SR, anxious to return to the gym He would like to pursue restoring SR with AAD With reduced EF , he will not be able to take Multaq or flecainide He is not interested in amiodarone for the potential  side effects Tikosyn and sotalol were discussed as best options and he will check on price of dofetilide Continue verapamil daily for now but this will have to be stopped as well as HCTZ for tikosyn admit Continue eliquis 5 mg bid for CHA2DS2VASc score of  4, states no missed doses No benadryl use  2. Hyperlipidemia Received an alert form pharmacy re interaction with simvastatin and verapamil  Pt has been taking these 2 drugs for a long time Will call in 10 mg simvastatin daily which is the recommended dose not to exceed with CCB's   He will call the clinic to schedule admission day after price of drug is known  Butch Penny C. Farhana Fellows, [facility] [address] [place], [place] [address] [phone]

## 2018-08-24 ENCOUNTER — Other Ambulatory Visit: Payer: Self-pay | Admitting: Nurse Practitioner

## 2018-08-24 ENCOUNTER — Telehealth: Payer: Self-pay | Admitting: Pharmacist

## 2018-08-24 NOTE — Telephone Encounter (Signed)
Medication list reviewed in anticipation of upcoming Tikosyn initiation. Patient is taking HCTZ and verapamil, both of which are contraindicated with Tikosyn. He also has diltiazem on his list which would be ok to continue or turn into scheduled dosing if needed. Could increase lisinopril dose if additional BP lowering is needed after HCTZ discontinuation.  Patient is anticoagulated on Eliquis 5mg  BID on the appropriate dose. Please ensure that patient has not missed any anticoagulation doses in the 3 weeks prior to Tikosyn initiation.   Patient will need to be counseled to avoid use of Benadryl while on Tikosyn and in the 2-3 days prior to Tikosyn initiation.

## 2018-08-25 ENCOUNTER — Telehealth: Payer: Self-pay

## 2018-08-25 ENCOUNTER — Ambulatory Visit (HOSPITAL_COMMUNITY): Payer: Medicare Other | Admitting: Nurse Practitioner

## 2018-08-25 ENCOUNTER — Telehealth: Payer: Self-pay | Admitting: Cardiovascular Disease

## 2018-08-25 NOTE — Telephone Encounter (Signed)
New Message   Patient c/o Palpitations:  High priority if patient c/o lightheadedness, shortness of breath, or chest pain  1) How long have you had palpitations/irregular HR/ Afib? Are you having the symptoms now? Yes, patient been in afib since 08/19/18.Marland KitchenMarland KitchenPatient having sysmtoms now  2) Are you currently experiencing lightheadedness, SOB or CP?  Chest pain and SOB  3) Do you have a history of afib (atrial fibrillation) or irregular heart rhythm? Yes   4) Have you checked your BP or HR? (document readings if available): BP- 106/60  HR- 81  5) Are you experiencing any other symptoms? NO

## 2018-08-25 NOTE — Telephone Encounter (Signed)
Spoke with pt c/o DOE, palpitations, decreased energy level, chest discomfort/diffuse chest pain. Pt BP: 134/80; HR: 74. Reviewed med list with pt who takes meds as prescribed. Advised pt to continue taking rate control agents and anticoagulant as prescribed. Pt states that after cardioversion on 1/6 he reverted to Afib on 1/10. Per pt, Roderic Palau, NP in Afib clinic discussed tikosyn, sotalol, or dofetilide with pt. Pt concerned about current symptoms.  DOD-Jordan, MD recommends that pt follow up with [facility] regarding initiation of tikosyn, sotalol, or dofetilide. Pt concerned about having further testing performed in relation to personal and family cardiovascular hx. Advised pt that he may schedule an appt with Dr. Gwenlyn Found or an APP to discuss if needed. No request for appt at this time, but pt will f/u with Afib clinic. Advised pt to report to ED if symptoms increase in severity. Pt verbalized understanding. Will route to Dr. Gwenlyn Found

## 2018-08-29 MED ORDER — DILTIAZEM HCL ER COATED BEADS 240 MG PO CP24: 240.0000 mg | ORAL_CAPSULE | Freq: Every day | ORAL | 6 refills | Status: DC

## 2018-08-29 MED ORDER — LISINOPRIL 10 MG PO TABS: 10.0000 mg | ORAL_TABLET | Freq: Every day | ORAL | 6 refills | Status: DC

## 2018-08-29 NOTE — Addendum Note (Signed)
Addended by: Juluis Mire on: 08/29/2018 10:00 AM   Modules accepted: Orders

## 2018-08-29 NOTE — Telephone Encounter (Signed)
Patient ready to proceed with tikosyn. Per Roderic Palau NP change verapamil to cardizem 240mg  once a day. Also removing hctz to only lisinopril 10mg  once a day. Pt understands this has to be done at least 3 days prior to admit.

## 2018-08-30 NOTE — Progress Notes (Signed)
Cardiology Office Note   Date:  09/07/2018   ID:  Christopher Navarro, DOB [birth year], MRN [id number]  PCP:  [name], MD  Cardiologist:   Leasha Goldberger [name], MD   Chief Complaint  Patient presents with  . Follow-up    Heart rate has been all over the place.  . Chest Pain    Discomfort.  . Fatigue  . Dizziness  . Shortness of Breath      History of Present Illness: Christopher Navarro is a 68 y.o. male who presents for evaluation of Afib and LV dysfunction. He was seen by me in the remote past. He has a history of WPW and underwent an ablation in 1997 by Dr. Caryl Comes. He was seen by Dr. Gwenlyn Found on Jul 20, 2018 for evaluation of new onset AFib. He states that prior to Thanksgiving he noted increase fatigue. As time went on he developed more fatigue, SOB, and some chest discomfort.  He was anticoagulated. Echo showed global HK with EF 40-45%. He has been on Verapamil for rate control. He was seen in the Afib clinic and after anticoagulation he underwent DCCV on 08/15/18. He unfortunately had early return of Afib.  He has a history of NIDDM, HLD, and HTN. He also has a family history of CAD with a brother having CABG.  He was seen back in Afib clinic and options for AAD therapy reviewed. Given LV dysfunction options include Tikosyn or amiodarone. Patient's family was concerned about this approach and thought he needed further evaluation of his LV dysfunction. He is seen by me for a second opinion.   He does monitor his HR and this has been well controlled. Fastest HR documented was 92. Symptoms persist as noted above. No orthopnea, dizziness, edema.     Past Medical History:  Diagnosis Date  . 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 Diabetes mellitus type II   . Dyslipidemia   . [place], [place] 06/20/2009  . HYPERTENSION 06/20/2009    Past Surgical History:  Procedure Laterality Date  . ablasion  1997  . BACK SURGERY  2006  . CARDIOVERSION N/A 08/15/2018   Procedure: CARDIOVERSION;  Surgeon: Fay Records, MD;  Location: [facility] ENDOSCOPY;  Service: Cardiovascular;  Laterality: N/A;  . CARPAL TUNNEL RELEASE  2004   Bilateral  . KNEE ARTHROSCOPY  2002     Current Outpatient Medications  Medication Sig Dispense Refill  . ELIQUIS 5 MG TABS tablet TAKE 1 TABLET BY MOUTH TWICE A DAY 60 tablet 6  . ibuprofen (ADVIL,MOTRIN) 200 MG tablet Take 400 mg by mouth 3 (three) times daily as needed for moderate pain.     . metFORMIN (GLUCOPHAGE) 500 MG tablet Take 500 mg by mouth 2 (two) times daily with a meal.    . Multiple Vitamin (MULTIVITAMIN WITH MINERALS) TABS tablet Take 1 tablet by mouth daily.    . carvedilol (COREG) 6.25 MG tablet Take 1 tablet (6.25 mg total) by mouth 2 (two) times daily. 180 tablet 3  . lisinopril (PRINIVIL,ZESTRIL) 20 MG tablet Take 1 tablet (20 mg total) by mouth daily. 90 tablet 3  . rosuvastatin (CRESTOR) 20 MG tablet Take 1 tablet (20 mg total) by mouth daily. 90 tablet 3   No current facility-administered medications for this visit.     Allergies:   Ciprofloxacin    Social History:  The patient  reports that he has quit smoking. He has never used smokeless  tobacco. He reports that he does not drink alcohol or use drugs.   Family History:  The patient's family history includes Arthritis in an other family member; Hyperlipidemia in an other family member; Hypertension in an other family member; Stroke in his mother.    ROS:  Please see the history of present illness.   Otherwise, review of systems are positive for none.   All other systems are reviewed and negative.    PHYSICAL EXAM: VS:  BP 122/60 (BP Location: Right Arm, Patient Position: Sitting, Cuff Size: Large)   Pulse (!) 57   Ht 6\' 1"  (1.854 m)   Wt 231 lb (104.8 kg)   BMI 30.48 kg/m  , BMI Body mass index is 30.48 kg/m. GEN: Well nourished, overweight, in no acute distress  HEENT: normal  Neck: no JVD, carotid bruits, or masses Cardiac: IRRR; no murmurs,  rubs, or gallops,no edema  Respiratory:  clear to auscultation bilaterally, normal work of breathing GI: soft, nontender, nondistended, + BS MS: no deformity or atrophy  Skin: warm and dry, no rash Neuro:  Strength and sensation are intact Psych: euthymic mood, full affect   EKG:  EKG is ordered today. The ekg ordered today demonstrates Afib with rate 57. Rightward axis. I have personally reviewed and interpreted this study.    Recent Labs: 03/08/2018: ALT 24 07/05/2018: BUN 13; Creatinine, Ser 0.98; Platelets 317.0; TSH 1.17 08/15/2018: Hemoglobin 16.3; Potassium 4.3; Sodium 137    Lipid Panel    Component Value Date/Time   CHOL 124 03/08/2018 1112   TRIG 186.0 (H) 03/08/2018 1112   HDL 42.90 03/08/2018 1112   CHOLHDL 3 03/08/2018 1112   VLDL 37.2 03/08/2018 1112   LDLCALC 44 03/08/2018 1112      Wt Readings from Last 3 Encounters:  09/07/18 231 lb (104.8 kg)  08/23/18 230 lb 6.4 oz (104.5 kg)  08/15/18 234 lb 8 oz (106.4 kg)      Other studies Reviewed: Additional studies/ records that were reviewed today include:  Echo 07/25/18: Study Conclusions  - Left ventricle: The cavity size was normal. There was mild   concentric hypertrophy. Systolic function was mildly to   moderately reduced. The estimated ejection fraction was in the   range of 40% to 45%. Diffuse hypokinesis. Features are consistent   with a pseudonormal left ventricular filling pattern, with   concomitant abnormal relaxation and increased filling pressure   (grade 2 diastolic dysfunction). Doppler parameters are   consistent with elevated ventricular end-diastolic filling   pressure. - Mitral valve: There was mild regurgitation. - Left atrium: The atrium was moderately dilated. - Right ventricle: The cavity size was normal. Wall thickness was   normal. Systolic function was normal. - Right atrium: The atrium was normal in size. - Tricuspid valve: There was no regurgitation. - Pericardium,  extracardiac: There was no pericardial effusion.  Impressions:  - No prior study available for comparison.    ASSESSMENT AND PLAN:  1.  Chronic systolic CHF with cardiomyopathy. EF 40-45%. Etiology unclear. Unlikely to be related to tachycardia since HR has been in normal range. I am concerned this may be ischemic mediated given multiple cardiac risk factors. To further evaluate will plan on proceeding with [facility] on Feb 6 with possible PCI. The procedure and risks were reviewed including but not limited to death, myocardial infarction, stroke, arrythmias, bleeding, transfusion, emergency surgery, dye allergy, or renal dysfunction. The patient voices understanding and is agreeable to proceed. Will need to hold Eliquis for  48 hours for invasive procedure. Hold metformin 48 hour post procedure.  We also need to optimize medical therapy for his CHF. He should not be on Verapamil given negative inotropic effects. Will stop verapamil and prn diltiazem. Start Coreg 6.25 mg bid. Switch lisinopril HCT to lisinopril and increase dose to 20 mg daily. Further adjustments pending results of cardiac cath. 2. Atrial fibrillation persistent and symptomatic. S/p DCCV with early return of AFib. Rate controlled. On Eliquis. May need to consider AAD therapy with Tikosyn load and repeat DCCV but will await results of cardiac cath. If he needs surgery for CAD he could be treated with surgical MAZE 3. Remote history of WPW s/p ablation 4. HTN controlled 5. Hypercholesterolemia. Will switch Zocor to Crestor 20 mg daily. due to multiple drug interactions with Zocor.  6. DM type 2 on metformin.    Current medicines are reviewed at length with the patient today.  The patient does not have concerns regarding medicines.  The following changes have been made:  See above  Labs/ tests ordered today include:   Orders Placed This Encounter  Procedures  . Basic metabolic panel  . CBC w/Diff/Platelet  . EKG 12-Lead      Disposition:   FU depending result of cardiac cath.  Signed, Assunta Pupo [name], MD  09/07/2018 11:06 AM    [facility] [address], [place], [place], [address] Phone [phone], Fax [phone]

## 2018-08-30 NOTE — H&P (View-Only) (Signed)
Cardiology Office Note   Date:  09/07/2018   ID:  GRANVEL PROUDFOOT, DOB 1951/03/14, MRN [id number]  PCP:  [name], MD  Cardiologist:   Peter [name], MD   Chief Complaint  Patient presents with  . Follow-up    Heart rate has been all over the place.  . Chest Pain    Discomfort.  . Fatigue  . Dizziness  . Shortness of Breath      History of Present Illness: Christopher Navarro is a 69 y.o. male who presents for evaluation of Afib and LV dysfunction. He was seen by me in the remote past. He has a history of WPW and underwent an ablation in 1997 by Dr. Caryl Comes. He was seen by Dr. Gwenlyn Found on Jul 20, 2018 for evaluation of new onset AFib. He states that prior to Thanksgiving he noted increase fatigue. As time went on he developed more fatigue, SOB, and some chest discomfort.  He was anticoagulated. Echo showed global HK with EF 40-45%. He has been on Verapamil for rate control. He was seen in the Afib clinic and after anticoagulation he underwent DCCV on 08/15/18. He unfortunately had early return of Afib.  He has a history of NIDDM, HLD, and HTN. He also has a family history of CAD with a brother having CABG.  He was seen back in Afib clinic and options for AAD therapy reviewed. Given LV dysfunction options include Tikosyn or amiodarone. Patient's family was concerned about this approach and thought he needed further evaluation of his LV dysfunction. He is seen by me for a second opinion.   He does monitor his HR and this has been well controlled. Fastest HR documented was 92. Symptoms persist as noted above. No orthopnea, dizziness, edema.     Past Medical History:  Diagnosis Date  . 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 Diabetes mellitus type II   . Dyslipidemia   . [place], [place] 06/20/2009  . HYPERTENSION 06/20/2009    Past Surgical History:  Procedure Laterality Date  . ablasion  1997  . BACK SURGERY  2006  . CARDIOVERSION N/A 08/15/2018   Procedure: CARDIOVERSION;  Surgeon: Fay Records, MD;  Location: [facility] ENDOSCOPY;  Service: Cardiovascular;  Laterality: N/A;  . CARPAL TUNNEL RELEASE  2004   Bilateral  . KNEE ARTHROSCOPY  2002     Current Outpatient Medications  Medication Sig Dispense Refill  . ELIQUIS 5 MG TABS tablet TAKE 1 TABLET BY MOUTH TWICE A DAY 60 tablet 6  . ibuprofen (ADVIL,MOTRIN) 200 MG tablet Take 400 mg by mouth 3 (three) times daily as needed for moderate pain.     . metFORMIN (GLUCOPHAGE) 500 MG tablet Take 500 mg by mouth 2 (two) times daily with a meal.    . Multiple Vitamin (MULTIVITAMIN WITH MINERALS) TABS tablet Take 1 tablet by mouth daily.    . carvedilol (COREG) 6.25 MG tablet Take 1 tablet (6.25 mg total) by mouth 2 (two) times daily. 180 tablet 3  . lisinopril (PRINIVIL,ZESTRIL) 20 MG tablet Take 1 tablet (20 mg total) by mouth daily. 90 tablet 3  . rosuvastatin (CRESTOR) 20 MG tablet Take 1 tablet (20 mg total) by mouth daily. 90 tablet 3   No current facility-administered medications for this visit.     Allergies:   Ciprofloxacin    Social History:  The patient  reports that he has quit smoking. He has never used smokeless  tobacco. He reports that he does not drink alcohol or use drugs.   Family History:  The patient's family history includes Arthritis in an other family member; Hyperlipidemia in an other family member; Hypertension in an other family member; Stroke in his mother.    ROS:  Please see the history of present illness.   Otherwise, review of systems are positive for none.   All other systems are reviewed and negative.    PHYSICAL EXAM: VS:  BP 122/60 (BP Location: Right Arm, Patient Position: Sitting, Cuff Size: Large)   Pulse (!) 57   Ht 6\' 1"  (1.854 m)   Wt 231 lb (104.8 kg)   BMI 30.48 kg/m  , BMI Body mass index is 30.48 kg/m. GEN: Well nourished, overweight, in no acute distress  HEENT: normal  Neck: no JVD, carotid bruits, or masses Cardiac: IRRR; no murmurs,  rubs, or gallops,no edema  Respiratory:  clear to auscultation bilaterally, normal work of breathing GI: soft, nontender, nondistended, + BS MS: no deformity or atrophy  Skin: warm and dry, no rash Neuro:  Strength and sensation are intact Psych: euthymic mood, full affect   EKG:  EKG is ordered today. The ekg ordered today demonstrates Afib with rate 57. Rightward axis. I have personally reviewed and interpreted this study.    Recent Labs: 03/08/2018: ALT 24 07/05/2018: BUN 13; Creatinine, Ser 0.98; Platelets 317.0; TSH 1.17 08/15/2018: Hemoglobin 16.3; Potassium 4.3; Sodium 137    Lipid Panel    Component Value Date/Time   CHOL 124 03/08/2018 1112   TRIG 186.0 (H) 03/08/2018 1112   HDL 42.90 03/08/2018 1112   CHOLHDL 3 03/08/2018 1112   VLDL 37.2 03/08/2018 1112   LDLCALC 44 03/08/2018 1112      Wt Readings from Last 3 Encounters:  09/07/18 231 lb (104.8 kg)  08/23/18 230 lb 6.4 oz (104.5 kg)  08/15/18 234 lb 8 oz (106.4 kg)      Other studies Reviewed: Additional studies/ records that were reviewed today include:  Echo 07/25/18: Study Conclusions  - Left ventricle: The cavity size was normal. There was mild   concentric hypertrophy. Systolic function was mildly to   moderately reduced. The estimated ejection fraction was in the   range of 40% to 45%. Diffuse hypokinesis. Features are consistent   with a pseudonormal left ventricular filling pattern, with   concomitant abnormal relaxation and increased filling pressure   (grade 2 diastolic dysfunction). Doppler parameters are   consistent with elevated ventricular end-diastolic filling   pressure. - Mitral valve: There was mild regurgitation. - Left atrium: The atrium was moderately dilated. - Right ventricle: The cavity size was normal. Wall thickness was   normal. Systolic function was normal. - Right atrium: The atrium was normal in size. - Tricuspid valve: There was no regurgitation. - Pericardium,  extracardiac: There was no pericardial effusion.  Impressions:  - No prior study available for comparison.    ASSESSMENT AND PLAN:  1.  Chronic systolic CHF with cardiomyopathy. EF 40-45%. Etiology unclear. Unlikely to be related to tachycardia since HR has been in normal range. I am concerned this may be ischemic mediated given multiple cardiac risk factors. To further evaluate will plan on proceeding with [facility] on Feb 6 with possible PCI. The procedure and risks were reviewed including but not limited to death, myocardial infarction, stroke, arrythmias, bleeding, transfusion, emergency surgery, dye allergy, or renal dysfunction. The patient voices understanding and is agreeable to proceed. Will need to hold Eliquis for  48 hours for invasive procedure. Hold metformin 48 hour post procedure.  We also need to optimize medical therapy for his CHF. He should not be on Verapamil given negative inotropic effects. Will stop verapamil and prn diltiazem. Start Coreg 6.25 mg bid. Switch lisinopril HCT to lisinopril and increase dose to 20 mg daily. Further adjustments pending results of cardiac cath. 2. Atrial fibrillation persistent and symptomatic. S/p DCCV with early return of AFib. Rate controlled. On Eliquis. May need to consider AAD therapy with Tikosyn load and repeat DCCV but will await results of cardiac cath. If he needs surgery for CAD he could be treated with surgical MAZE 3. Remote history of WPW s/p ablation 4. HTN controlled 5. Hypercholesterolemia. Will switch Zocor to Crestor 20 mg daily. due to multiple drug interactions with Zocor.  6. DM type 2 on metformin.    Current medicines are reviewed at length with the patient today.  The patient does not have concerns regarding medicines.  The following changes have been made:  See above  Labs/ tests ordered today include:   Orders Placed This Encounter  Procedures  . Basic metabolic panel  . CBC w/Diff/Platelet  . EKG 12-Lead      Disposition:   FU depending result of cardiac cath.  Signed, Peter [name], MD  09/07/2018 11:06 AM    [facility] [address] [place], [place], [address] Phone [phone], Fax [phone]

## 2018-09-07 ENCOUNTER — Encounter: Payer: Self-pay | Admitting: Cardiology

## 2018-09-07 ENCOUNTER — Ambulatory Visit (INDEPENDENT_AMBULATORY_CARE_PROVIDER_SITE_OTHER): Payer: Medicare Other | Admitting: Cardiology

## 2018-09-07 ENCOUNTER — Other Ambulatory Visit: Payer: Self-pay | Admitting: Cardiology

## 2018-09-07 VITALS — BP 122/60 | HR 57 | Ht 73.0 in | Wt 231.0 lb

## 2018-09-07 DIAGNOSIS — I5022 Chronic systolic (congestive) heart failure: Secondary | ICD-10-CM | POA: Diagnosis not present

## 2018-09-07 DIAGNOSIS — I4819 Other persistent atrial fibrillation: Secondary | ICD-10-CM

## 2018-09-07 DIAGNOSIS — I10 Essential (primary) hypertension: Secondary | ICD-10-CM | POA: Diagnosis not present

## 2018-09-07 DIAGNOSIS — E119 Type 2 diabetes mellitus without complications: Secondary | ICD-10-CM

## 2018-09-07 DIAGNOSIS — E7800 Pure hypercholesterolemia, unspecified: Secondary | ICD-10-CM

## 2018-09-07 DIAGNOSIS — I420 Dilated cardiomyopathy: Secondary | ICD-10-CM | POA: Diagnosis not present

## 2018-09-07 LAB — BASIC METABOLIC PANEL
BUN/Creatinine Ratio: 15 (ref 10–24)
BUN: 14 mg/dL (ref 8–27)
CO2: 22 mmol/L (ref 20–29)
Calcium: 9.8 mg/dL (ref 8.6–10.2)
Chloride: 95 mmol/L — ABNORMAL LOW (ref 96–106)
Creatinine, Ser: 0.94 mg/dL (ref 0.76–1.27)
GFR calc Af Amer: 95 mL/min/{1.73_m2} (ref >59)
GFR calc non Af Amer: 82 mL/min/{1.73_m2} (ref >59)
Glucose: 123 mg/dL — ABNORMAL HIGH (ref 65–99)
Potassium: 4.8 mmol/L (ref 3.5–5.2)
Sodium: 134 mmol/L (ref 134–144)

## 2018-09-07 LAB — CBC WITH DIFFERENTIAL/PLATELET
Basophils Absolute: 0.1 10*3/uL (ref 0.0–0.2)
Basos: 1 %
EOS (ABSOLUTE): 0.1 10*3/uL (ref 0.0–0.4)
Eos: 2 %
Hematocrit: 48.6 % (ref 37.5–51.0)
Hemoglobin: 16 g/dL (ref 13.0–17.7)
Immature Grans (Abs): 0 10*3/uL (ref 0.0–0.1)
Immature Granulocytes: 0 %
LYMPHS ABS: 2 10*3/uL (ref 0.7–3.1)
Lymphs: 28 %
MCH: 26.2 pg — AB (ref 26.6–33.0)
MCHC: 32.9 g/dL (ref 31.5–35.7)
MCV: 80 fL (ref 79–97)
Monocytes Absolute: 0.7 10*3/uL (ref 0.1–0.9)
Monocytes: 10 %
NEUTROS ABS: 4.3 10*3/uL (ref 1.4–7.0)
Neutrophils: 59 %
Platelets: 364 10*3/uL (ref 150–450)
RBC: 6.1 x10E6/uL — ABNORMAL HIGH (ref 4.14–5.80)
RDW: 13.8 % (ref 11.6–15.4)
WBC: 7.2 10*3/uL (ref 3.4–10.8)

## 2018-09-07 MED ORDER — CARVEDILOL 6.25 MG PO TABS: 6.2500 mg | ORAL_TABLET | Freq: Two times a day (BID) | ORAL | 3 refills | Status: DC

## 2018-09-07 MED ORDER — ROSUVASTATIN CALCIUM 20 MG PO TABS: 20.0000 mg | ORAL_TABLET | Freq: Every day | ORAL | 3 refills | Status: DC

## 2018-09-07 MED ORDER — LISINOPRIL 20 MG PO TABS: 20.0000 mg | ORAL_TABLET | Freq: Every day | ORAL | 3 refills | Status: DC

## 2018-09-07 NOTE — Patient Instructions (Addendum)
Stop taking diltiazem, verapamil, and Zocor  Start taking Coreg 6.25 mg twice a day  Increase lisinopril 20 mg daily.   Start Crestor 20 mg daily for cholesterol.   We will schedule you for a right and left heart cath.        [facility] [place] [address] Dept: [phone] Loc: [phone]  MANUS WEEDMAN  09/07/2018  You are scheduled for a Right and Left cardiac cath on Thursday 09/15/18,  with Dr.Jordan.  1. Please arrive at the [facility] (Main Entrance A) at [facility]: [address] [place], [place] [address] at 5:30 am (This time is two hours before your procedure to ensure your preparation). Free valet parking service is available.   Special note: Every effort is made to have your procedure done on time. Please understand that emergencies sometimes delay scheduled procedures.  2. Diet: Do not eat solid foods after midnight.  The patient may have clear liquids until 5am upon the day of the procedure.  3. Labs: You will need to have blood drawn on  Today Wed 1/29 You do not need to be fasting.  4. Medication instructions in preparation for your procedure:     Hold Eliquis 2 days prior to cath  Hold Metformin the day before cath and hold 2 days after cath        On the morning of your procedure, take your Aspirin 81 mg and any morning medicines NOT listed above.  You may use sips of water.  5. Plan for one night stay--bring personal belongings. 6. Bring a current list of your medications and current insurance cards. 7. You MUST have a responsible person to drive you home. 8. Someone MUST be with you the first 24 hours after you arrive home or your discharge will be delayed. 9. Please wear clothes that are easy to get on and off and wear slip-on shoes.  Thank you for allowing [place] to care for you!   -- [place]  [facility]

## 2018-09-08 ENCOUNTER — Encounter (HOSPITAL_COMMUNITY): Payer: Self-pay

## 2018-09-08 ENCOUNTER — Encounter: Payer: Self-pay | Admitting: Cardiology

## 2018-09-13 ENCOUNTER — Ambulatory Visit (HOSPITAL_COMMUNITY): Payer: Medicare Other | Admitting: Nurse Practitioner

## 2018-09-14 ENCOUNTER — Telehealth: Payer: Self-pay | Admitting: *Deleted

## 2018-09-14 NOTE — Telephone Encounter (Signed)
Pt contacted pre-catheterization scheduled at [facility] for: Thursday Sep 15, 2018 7:30 AM Verified arrival time and place: [facility] Entrance A at: 5:30 AM  No solid food after midnight prior to cath, clear liquids until 5 AM day of procedure.  Hold: Metformin-day of procedure and 48 hours post procedure.  Eliquis-last dose PM 09/12/18 until post procedure.  Except hold medications AM meds can be  taken pre-cath with sip of water including: ASA 81 mg  Confirmed patient has responsible person to drive home post procedure and observe 24 hours after arriving home: yes

## 2018-09-15 ENCOUNTER — Encounter (HOSPITAL_COMMUNITY): Payer: Self-pay | Admitting: Cardiology

## 2018-09-15 ENCOUNTER — Ambulatory Visit (HOSPITAL_COMMUNITY)
Admission: RE | Admit: 2018-09-15 | Discharge: 2018-09-15 | Disposition: A | Discharge status: Home / Self Care | Payer: Medicare Other | Attending: Cardiology | Admitting: Cardiology

## 2018-09-15 ENCOUNTER — Other Ambulatory Visit: Payer: Self-pay

## 2018-09-15 ENCOUNTER — Encounter (HOSPITAL_COMMUNITY)
Admission: RE | Disposition: A | Discharge status: Home / Self Care | Payer: Self-pay | Source: Home / Self Care | Attending: Cardiology

## 2018-09-15 DIAGNOSIS — Z7984 Long term (current) use of oral hypoglycemic drugs: Secondary | ICD-10-CM | POA: Diagnosis not present

## 2018-09-15 DIAGNOSIS — Z881 Allergy status to other antibiotic agents status: Secondary | ICD-10-CM | POA: Insufficient documentation

## 2018-09-15 DIAGNOSIS — I10 Essential (primary) hypertension: Secondary | ICD-10-CM | POA: Diagnosis present

## 2018-09-15 DIAGNOSIS — Z823 Family history of stroke: Secondary | ICD-10-CM | POA: Diagnosis not present

## 2018-09-15 DIAGNOSIS — R0602 Shortness of breath: Secondary | ICD-10-CM | POA: Insufficient documentation

## 2018-09-15 DIAGNOSIS — E785 Hyperlipidemia, unspecified: Secondary | ICD-10-CM | POA: Diagnosis present

## 2018-09-15 DIAGNOSIS — I25119 Atherosclerotic heart disease of native coronary artery with unspecified angina pectoris: Secondary | ICD-10-CM | POA: Insufficient documentation

## 2018-09-15 DIAGNOSIS — Z79899 Other long term (current) drug therapy: Secondary | ICD-10-CM | POA: Diagnosis not present

## 2018-09-15 DIAGNOSIS — I4819 Other persistent atrial fibrillation: Secondary | ICD-10-CM | POA: Diagnosis present

## 2018-09-15 DIAGNOSIS — I2510 Atherosclerotic heart disease of native coronary artery without angina pectoris: Secondary | ICD-10-CM

## 2018-09-15 DIAGNOSIS — I5022 Chronic systolic (congestive) heart failure: Secondary | ICD-10-CM | POA: Diagnosis present

## 2018-09-15 DIAGNOSIS — I4891 Unspecified atrial fibrillation: Secondary | ICD-10-CM | POA: Insufficient documentation

## 2018-09-15 DIAGNOSIS — Z7901 Long term (current) use of anticoagulants: Secondary | ICD-10-CM | POA: Diagnosis not present

## 2018-09-15 DIAGNOSIS — I110 Hypertensive heart disease with heart failure: Secondary | ICD-10-CM | POA: Insufficient documentation

## 2018-09-15 DIAGNOSIS — Z8249 Family history of ischemic heart disease and other diseases of the circulatory system: Secondary | ICD-10-CM | POA: Diagnosis not present

## 2018-09-15 DIAGNOSIS — E119 Type 2 diabetes mellitus without complications: Secondary | ICD-10-CM | POA: Diagnosis not present

## 2018-09-15 DIAGNOSIS — Z87891 Personal history of nicotine dependence: Secondary | ICD-10-CM | POA: Insufficient documentation

## 2018-09-15 DIAGNOSIS — E7800 Pure hypercholesterolemia, unspecified: Secondary | ICD-10-CM | POA: Diagnosis not present

## 2018-09-15 HISTORY — PX: RIGHT/LEFT HEART CATH AND CORONARY ANGIOGRAPHY: CATH118266

## 2018-09-15 LAB — POCT I-STAT 7, (LYTES, BLD GAS, ICA,H+H)
Bicarbonate: 23.4 mmol/L (ref 20.0–28.0)
Calcium, Ion: 1.14 mmol/L — ABNORMAL LOW (ref 1.15–1.40)
HCT: 44 % (ref 39.0–52.0)
Hemoglobin: 15 g/dL (ref 13.0–17.0)
O2 Saturation: 97 %
POTASSIUM: 4.1 mmol/L (ref 3.5–5.1)
Sodium: 138 mmol/L (ref 135–145)
TCO2: 24 mmol/L (ref 22–32)
pCO2 arterial: 33.1 mmHg (ref 32.0–48.0)
pH, Arterial: 7.457 — ABNORMAL HIGH (ref 7.350–7.450)
pO2, Arterial: 84 mmHg (ref 83.0–108.0)

## 2018-09-15 LAB — POCT I-STAT EG7
Bicarbonate: 25.1 mmol/L (ref 20.0–28.0)
Calcium, Ion: 1.16 mmol/L (ref 1.15–1.40)
HCT: 44 % (ref 39.0–52.0)
HEMOGLOBIN: 15 g/dL (ref 13.0–17.0)
O2 Saturation: 57 %
PCO2 VEN: 41.8 mmHg — AB (ref 44.0–60.0)
Potassium: 4.1 mmol/L (ref 3.5–5.1)
Sodium: 139 mmol/L (ref 135–145)
TCO2: 26 mmol/L (ref 22–32)
pH, Ven: 7.387 (ref 7.250–7.430)
pO2, Ven: 30 mmHg — CL (ref 32.0–45.0)

## 2018-09-15 LAB — GLUCOSE, CAPILLARY: GLUCOSE-CAPILLARY: 132 mg/dL — AB (ref 70–99)

## 2018-09-15 SURGERY — RIGHT/LEFT HEART CATH AND CORONARY ANGIOGRAPHY
Anesthesia: LOCAL

## 2018-09-15 MED ORDER — LIDOCAINE HCL (PF) 1 % IJ SOLN
INTRAMUSCULAR | Status: AC
  Filled 2018-09-15: qty 30

## 2018-09-15 MED ORDER — HEPARIN (PORCINE) IN NACL 1000-0.9 UT/500ML-% IV SOLN
INTRAVENOUS | Status: DC | PRN
  Administered 2018-09-15 (×2): 500 mL

## 2018-09-15 MED ORDER — HEPARIN SODIUM (PORCINE) 1000 UNIT/ML IJ SOLN
INTRAMUSCULAR | Status: DC | PRN
  Administered 2018-09-15: 5000 [IU] via INTRAVENOUS

## 2018-09-15 MED ORDER — SODIUM CHLORIDE 0.9% FLUSH: 3.0000 mL | Freq: Two times a day (BID) | INTRAVENOUS | Status: DC

## 2018-09-15 MED ORDER — FENTANYL CITRATE (PF) 100 MCG/2ML IJ SOLN
INTRAMUSCULAR | Status: DC | PRN
  Administered 2018-09-15: 25 ug via INTRAVENOUS

## 2018-09-15 MED ORDER — METFORMIN HCL 500 MG PO TABS: 500.0000 mg | ORAL_TABLET | Freq: Two times a day (BID) | ORAL | Status: DC

## 2018-09-15 MED ORDER — SODIUM CHLORIDE 0.9% FLUSH: 3.0000 mL | INTRAVENOUS | Status: DC | PRN

## 2018-09-15 MED ORDER — VERAPAMIL HCL 2.5 MG/ML IV SOLN
INTRAVENOUS | Status: DC | PRN
  Administered 2018-09-15: 10 mL via INTRA_ARTERIAL

## 2018-09-15 MED ORDER — SODIUM CHLORIDE 0.9 % IV SOLN
INTRAVENOUS | Status: DC
  Administered 2018-09-15: 06:00:00 via INTRAVENOUS

## 2018-09-15 MED ORDER — SODIUM CHLORIDE 0.9 % IV SOLN: 250.0000 mL | INTRAVENOUS | Status: DC | PRN

## 2018-09-15 MED ORDER — CARVEDILOL 25 MG PO TABS: 25.0000 mg | ORAL_TABLET | Freq: Two times a day (BID) | ORAL | 11 refills | Status: DC

## 2018-09-15 MED ORDER — ASPIRIN 81 MG PO CHEW: 81.0000 mg | CHEWABLE_TABLET | ORAL | Status: DC

## 2018-09-15 MED ORDER — ACETAMINOPHEN 325 MG PO TABS: 650.0000 mg | ORAL_TABLET | ORAL | Status: DC | PRN

## 2018-09-15 MED ORDER — MIDAZOLAM HCL 2 MG/2ML IJ SOLN
INTRAMUSCULAR | Status: DC | PRN
  Administered 2018-09-15: 1 mg via INTRAVENOUS

## 2018-09-15 MED ORDER — LIDOCAINE HCL (PF) 1 % IJ SOLN
INTRAMUSCULAR | Status: DC | PRN
  Administered 2018-09-15 (×2): 2 mL

## 2018-09-15 MED ORDER — APIXABAN 5 MG PO TABS: 5.0000 mg | ORAL_TABLET | Freq: Two times a day (BID) | ORAL | Status: DC

## 2018-09-15 MED ORDER — MIDAZOLAM HCL 2 MG/2ML IJ SOLN
INTRAMUSCULAR | Status: AC
  Filled 2018-09-15: qty 2

## 2018-09-15 MED ORDER — ONDANSETRON HCL 4 MG/2ML IJ SOLN: 4.0000 mg | Freq: Four times a day (QID) | INTRAMUSCULAR | Status: DC | PRN

## 2018-09-15 MED ORDER — FENTANYL CITRATE (PF) 100 MCG/2ML IJ SOLN
INTRAMUSCULAR | Status: AC
  Filled 2018-09-15: qty 2

## 2018-09-15 MED ORDER — HEPARIN (PORCINE) IN NACL 1000-0.9 UT/500ML-% IV SOLN
INTRAVENOUS | Status: AC
  Filled 2018-09-15: qty 1000

## 2018-09-15 MED ORDER — IOHEXOL 350 MG/ML SOLN
INTRAVENOUS | Status: DC | PRN
  Administered 2018-09-15: 80 mL via INTRA_ARTERIAL

## 2018-09-15 MED ORDER — SODIUM CHLORIDE 0.9 % IV SOLN: INTRAVENOUS | Status: AC

## 2018-09-15 SURGICAL SUPPLY — 12 items
CATH 5FR JL3.5 JR4 ANG PIG MP (CATHETERS) ×1 IMPLANT
CATH BALLN WEDGE 5F 110CM (CATHETERS) ×1 IMPLANT
DEVICE RAD COMP TR BAND LRG (VASCULAR PRODUCTS) ×1 IMPLANT
GLIDESHEATH SLEND SS 6F .021 (SHEATH) ×1 IMPLANT
GUIDEWIRE INQWIRE 1.5J.035X260 (WIRE) IMPLANT
INQWIRE 1.5J .035X260CM (WIRE) ×2
KIT HEART LEFT (KITS) ×2 IMPLANT
PACK CARDIAC CATHETERIZATION (CUSTOM PROCEDURE TRAY) ×2 IMPLANT
SHEATH GLIDE SLENDER 4/5FR (SHEATH) ×1 IMPLANT
SYR MEDRAD MARK 7 150ML (SYRINGE) ×2 IMPLANT
TRANSDUCER W/STOPCOCK (MISCELLANEOUS) ×2 IMPLANT
TUBING CIL FLEX 10 FLL-RA (TUBING) ×2 IMPLANT

## 2018-09-15 NOTE — Discharge Instructions (Signed)
Radial Site Care ° °This sheet gives you information about how to care for yourself after your procedure. Your health care provider may also give you more specific instructions. If you have problems or questions, contact your health care provider. °What can I expect after the procedure? °After the procedure, it is common to have: °· Bruising and tenderness at the catheter insertion area. °Follow these instructions at home: °Medicines °· Take over-the-counter and prescription medicines only as told by your health care provider. °Insertion site care °· Follow instructions from your health care provider about how to take care of your insertion site. Make sure you: °? Wash your hands with soap and water before you change your bandage (dressing). If soap and water are not available, use hand sanitizer. °? Change your dressing as told by your health care provider. °? Leave stitches (sutures), skin glue, or adhesive strips in place. These skin closures may need to stay in place for 2 weeks or longer. If adhesive strip edges start to loosen and curl up, you may trim the loose edges. Do not remove adhesive strips completely unless your health care provider tells you to do that. °· Check your insertion site every day for signs of infection. Check for: °? Redness, swelling, or pain. °? Fluid or blood. °? Pus or a bad smell. °? Warmth. °· Do not take baths, swim, or use a hot tub until your health care provider approves. °· You may shower 24-48 hours after the procedure, or as directed by your health care provider. °? Remove the dressing and gently wash the site with plain soap and water. °? Pat the area dry with a clean towel. °? Do not rub the site. That could cause bleeding. °· Do not apply powder or lotion to the site. °Activity ° °· For 24 hours after the procedure, or as directed by your health care provider: °? Do not flex or bend the affected arm. °? Do not push or pull heavy objects with the affected arm. °? Do not  drive yourself home from the hospital or clinic. You may drive 24 hours after the procedure unless your health care provider tells you not to. °? Do not operate machinery or power tools. °· Do not lift anything that is heavier than 10 lb (4.5 kg), or the limit that you are told, until your health care provider says that it is safe. °· Ask your health care provider when it is okay to: °? Return to work or school. °? Resume usual physical activities or sports. °? Resume sexual activity. °General instructions °· If the catheter site starts to bleed, raise your arm and put firm pressure on the site. If the bleeding does not stop, get help right away. This is a medical emergency. °· If you went home on the same day as your procedure, a responsible adult should be with you for the first 24 hours after you arrive home. °· Keep all follow-up visits as told by your health care provider. This is important. °Contact a health care provider if: °· You have a fever. °· You have redness, swelling, or yellow drainage around your insertion site. °Get help right away if: °· You have unusual pain at the radial site. °· The catheter insertion area swells very fast. °· The insertion area is bleeding, and the bleeding does not stop when you hold steady pressure on the area. °· Your arm or hand becomes pale, cool, tingly, or numb. °These symptoms may represent a serious problem   that is an emergency. Do not wait to see if the symptoms will go away. Get medical help right away. Call your local emergency services (911 in the [place] Do not drive yourself to the hospital. °Summary °· After the procedure, it is common to have bruising and tenderness at the site. °· Follow instructions from your health care provider about how to take care of your radial site wound. Check the wound every day for signs of infection. °· Do not lift anything that is heavier than 10 lb (4.5 kg), or the limit that you are told, until your health care provider says  that it is safe. °This information is not intended to replace advice given to you by your health care provider. Make sure you discuss any questions you have with your health care provider. °Document Released: 08/29/2010 Document Revised: 09/01/2017 Document Reviewed: 09/01/2017 °Elsevier Interactive Patient Education © 2019 [employer] ° °

## 2018-09-15 NOTE — Interval H&P Note (Signed)
History and Physical Interval Note:  09/15/2018 7:26 AM  [name]  has presented today for surgery, with the diagnosis of cm - cp  The various methods of treatment have been discussed with the patient and family. After consideration of risks, benefits and other options for treatment, the patient has consented to  Procedure(s): RIGHT/LEFT HEART CATH AND CORONARY ANGIOGRAPHY (N/A) as a surgical intervention .  The patient's history has been reviewed, patient examined, no change in status, stable for surgery.  I have reviewed the patient's chart and labs.  Questions were answered to the patient's satisfaction.   Cath Lab Visit (complete for each Cath Lab visit)  Clinical Evaluation Leading to the Procedure:   ACS: No.  Non-ACS:    Anginal Classification: CCS II  Anti-ischemic medical therapy: Minimal Therapy (1 class of medications)  Non-Invasive Test Results: No non-invasive testing performed  Prior CABG: No previous CABG        Collier [facility] 09/15/2018 7:26 AM

## 2018-09-16 ENCOUNTER — Telehealth: Payer: Self-pay

## 2018-09-16 NOTE — Telephone Encounter (Signed)
Spoke to patient advised Dr.Jordan wants you to see our pharmacist next week to switch Lisinopril to [place] and start Jardiance.Appointment scheduled with pharmacy 09/20/18 at 8:30 am.Advised to arrive 10 mins early.Advised to keep appointment with Dr.Jordan 2/24 at 11:40 am.

## 2018-09-20 ENCOUNTER — Ambulatory Visit (INDEPENDENT_AMBULATORY_CARE_PROVIDER_SITE_OTHER): Payer: Medicare Other | Admitting: Pharmacist

## 2018-09-20 VITALS — BP 110/60 | HR 53 | Resp 14 | Wt 233.6 lb

## 2018-09-20 DIAGNOSIS — I5022 Chronic systolic (congestive) heart failure: Secondary | ICD-10-CM

## 2018-09-20 MED ORDER — SACUBITRIL-VALSARTAN 24-26 MG PO TABS: 1.0000 | ORAL_TABLET | Freq: Two times a day (BID) | ORAL | 0 refills | Status: DC

## 2018-09-20 MED ORDER — EMPAGLIFLOZIN 10 MG PO TABS: 10.0000 mg | ORAL_TABLET | Freq: Every day | ORAL | 0 refills | Status: DC

## 2018-09-20 NOTE — Patient Instructions (Addendum)
Return for a  follow up appointment in 2 WEEKS with Dr [name]  Go to the lab in [place] your blood pressure at home daily (if able) and keep record of the readings.  Take your  meds as follows:  *STOP taking lisinopril*  *START taking Entresto 24/26 twice daily (36 hours after last lisinopril dose)  *START taking Jardiance 10mg  daily*  Bring all of your meds, your BP cuff and your record of home blood pressures to your next appointment.  Exercise as you're able, try to walk approximately 30 minutes per day.  Keep salt intake to a minimum, especially watch canned and prepared boxed foods.  Eat more fresh fruits and vegetables and fewer canned items.  Avoid eating in fast food restaurants.

## 2018-09-20 NOTE — Progress Notes (Signed)
Patient ID: Christopher Navarro                 DOB: 1951/06/28                      MRN: [id number]     HPI: Christopher Navarro is a 68 y.o. male referred by Dr. [name] to pharmacist clinic. PMH includes Afib,LV dysfunction with EF 35% on 09/15/2018, hyperlipidemia, DM-II, and hypertension. Patient presents to clinic to transition from lisinopril to [facility], initiate Jardiance and optimization of HF pharmacotherapy. He denies problems with current therapy or intolerance to medication. Only complain is feeling tired with basic level of physical activity, and inability to exercise as much as he used to. Denies shortness of breath, weight gain, swelling, dizziness, chest pain, or blurry vision. Patient is a retired Runner, broadcasting/film/video.  Current HTN meds:  Carvedilol 25mg  twice daily Lisinopril 20mg  daily  Intolerance: none noted  BP goal: <130/80  Family History:  The patient's family history includes Arthritis in an other family member; Hyperlipidemia in an other family member; Hypertension in an other family member; Stroke in his mother.   Social History: The patient  reports that he has quit smoking. He has never used smokeless tobacco. He reports that he does not drink alcohol or use drugs.   Diet: low sodium  Exercise: goes to the gym regularly but noted decreased exercise tolerance  Home BP readings: none provided   Wt Readings from Last 3 Encounters:  09/20/18 233 lb 9.6 oz (106 kg)  09/15/18 230 lb (104.3 kg)  09/07/18 231 lb (104.8 kg)   BP Readings from Last 3 Encounters:  09/20/18 110/60  09/15/18 (!) 130/99  09/07/18 122/60   Pulse Readings from Last 3 Encounters:  09/20/18 (!) 53  09/15/18 (!) 46  09/07/18 (!) 57    Renal function: Estimated Creatinine Clearance: 93.4 mL/min (by C-G formula based on SCr of 0.94 mg/dL).  Past Medical History:  Diagnosis Date  . 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 Diabetes mellitus type II   .  Dyslipidemia   . [place], [place] 06/20/2009  . HYPERTENSION 06/20/2009    Current Outpatient Medications on File Prior to Visit  Medication Sig Dispense Refill  . apixaban (ELIQUIS) 5 MG TABS tablet Take 1 tablet (5 mg total) by mouth 2 (two) times daily.    . carvedilol (COREG) 25 MG tablet Take 1 tablet (25 mg total) by mouth 2 (two) times daily. 60 tablet 11  . Multiple Vitamin (MULTIVITAMIN WITH MINERALS) TABS tablet Take 1 tablet by mouth daily.    . rosuvastatin (CRESTOR) 20 MG tablet Take 1 tablet (20 mg total) by mouth daily. 90 tablet 3   No current facility-administered medications on file prior to visit.     Allergies  Allergen Reactions  . Ciprofloxacin Hives and Rash    Blood pressure 110/60, pulse (!) 53, resp. rate 14, weight 233 lb 9.6 oz (106 kg).  Chronic systolic CHF (congestive heart failure) [facility]) Patient willing to try Delene Loll and Reino Kent as recommend by provider. He understands benefits of the medication as well as increased cost of therapy. Entresto [facility] paperwork was provided during this OV for patient to read and complete prior to f/u visit.   Will stop Lisinopril (last dose this morning), start Entresto low dose (24/26mg ) twice daily 36 hours after last lisinopril dose, start Jardiance 10mg  daily, and repeat BMET in 7-10  days. Samples of Entresto and Jardiance were provided today. Plan to send new prescription and provide 30 day supply boucher once stable doses of both medication is known. Patient is to f/u with Dr [name] in 2 weeks for further titration and monitoring. He will bring home BP and HR readings for complete assessment.  Maree Ainley Rodriguez-Guzman PharmD, BCPS, [place] [address] 09/21/2018 3:03 PM

## 2018-09-21 ENCOUNTER — Encounter: Payer: Self-pay | Admitting: Pharmacist

## 2018-09-21 ENCOUNTER — Other Ambulatory Visit: Payer: Self-pay | Admitting: Family Medicine

## 2018-09-21 NOTE — Assessment & Plan Note (Signed)
Patient willing to try Delene Loll and Reino Kent as recommend by provider. He understands benefits of the medication as well as increased cost of therapy. Entresto [facility] paperwork was provided during this OV for patient to read and complete prior to f/u visit.   Will stop Lisinopril (last dose this morning), start Entresto low dose (24/26mg ) twice daily 36 hours after last lisinopril dose, start Jardiance 10mg  daily, and repeat BMET in 7-10 days. Samples of Entresto and Jardiance were provided today. Plan to send new prescription and provide 30 day supply boucher once stable doses of both medication is known. Patient is to f/u with Dr [name] in 2 weeks for further titration and monitoring. He will bring home BP and HR readings for complete assessment.

## 2018-09-28 LAB — COMPREHENSIVE METABOLIC PANEL
ALT: 23 IU/L (ref 0–44)
AST: 24 IU/L (ref 0–40)
Albumin/Globulin Ratio: 1.5 (ref 1.2–2.2)
Albumin: 4 g/dL (ref 3.8–4.8)
Alkaline Phosphatase: 81 IU/L (ref 39–117)
BUN/Creatinine Ratio: 13 (ref 10–24)
BUN: 16 mg/dL (ref 8–27)
Bilirubin Total: 0.6 mg/dL (ref 0.0–1.2)
CO2: 22 mmol/L (ref 20–29)
Calcium: 9.3 mg/dL (ref 8.6–10.2)
Chloride: 102 mmol/L (ref 96–106)
Creatinine, Ser: 1.19 mg/dL (ref 0.76–1.27)
GFR calc non Af Amer: 62 mL/min/{1.73_m2} (ref >59)
GFR, EST AFRICAN AMERICAN: 71 mL/min/{1.73_m2} (ref >59)
Globulin, Total: 2.7 g/dL (ref 1.5–4.5)
Glucose: 150 mg/dL — ABNORMAL HIGH (ref 65–99)
Potassium: 4.8 mmol/L (ref 3.5–5.2)
Sodium: 140 mmol/L (ref 134–144)
TOTAL PROTEIN: 6.7 g/dL (ref 6.0–8.5)

## 2018-10-02 NOTE — Progress Notes (Signed)
Cardiology Office Note   Date:  10/03/2018   ID:  JMICHAEL GILLE, DOB Dec 12, 1950, MRN [id number]  PCP:  [name], MD  Cardiologist:   Aseret Hoffman [name], MD   Chief Complaint  Patient presents with  . Shortness of Breath    shortness of breath with activy , when to a fib around dec.2019  . Fatigue      History of Present Illness: Christopher Navarro is a 68 y.o. male who presents for follow up  of Afib and CHF.  He has a history of WPW and underwent an ablation in 1997 by Dr. Caryl Comes. He was seen by Dr. Gwenlyn Found on Jul 20, 2018 for evaluation of new onset AFib. He states that prior to Thanksgiving he noted increase fatigue. As time went on he developed more fatigue, SOB, and some chest discomfort.  He was anticoagulated. Echo showed global HK with EF 40-45%. He has been on Verapamil for rate control. He was seen in the Afib clinic and after anticoagulation he underwent DCCV on 08/15/18. He unfortunately had early return of Afib.  He has a history of NIDDM, HLD, and HTN. He also has a family history of CAD with a brother having CABG.  He was seen back in Afib clinic and options for AAD therapy reviewed. Given LV dysfunction options include Tikosyn or amiodarone. Patient's family was concerned about this approach and thought he needed further evaluation of his LV dysfunction. He is seen by me for a second opinion.   On his initial visit with me we switched his Verapamil to Coreg due to negative inotropic effects of Verapamil. His lisinopril dose was increased. He subsequently underwent [facility] on 09/15/18. 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 Mildly elevated LV filling pressures and pulmonary HTN. Low cardiac output. His Coreg dose was increased. He was switched from lisinopril to [facility] and was also started on Jardiance. Consider Tikosyn load and repeat DCCV once CHF meds optimized.   On follow up today he is still not feeling well. He has lost 9 lbs and his sugars are improved. His BP at home  runs 100/60. HR is controlled. Still notes fatigue and DOE. No edema.     Past Medical History:  Diagnosis Date  . 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 Diabetes mellitus type II   . Dyslipidemia   . [place], [place] 06/20/2009  . HYPERTENSION 06/20/2009    Past Surgical History:  Procedure Laterality Date  . ablasion  1997  . BACK SURGERY  2006  . CARDIOVERSION N/A 08/15/2018   Procedure: CARDIOVERSION;  Surgeon: Fay Records, MD;  Location: [facility] ENDOSCOPY;  Service: Cardiovascular;  Laterality: N/A;  . CARPAL TUNNEL RELEASE  2004   Bilateral  . KNEE ARTHROSCOPY  2002  . RIGHT/LEFT HEART CATH AND CORONARY ANGIOGRAPHY N/A 09/15/2018   Procedure: RIGHT/LEFT HEART CATH AND CORONARY ANGIOGRAPHY;  Surgeon: [name], Ramiyah Mcclenahan M, MD;  Location: [place] CV LAB;  Service: Cardiovascular;  Laterality: N/A;     Current Outpatient Medications  Medication Sig Dispense Refill  . apixaban (ELIQUIS) 5 MG TABS tablet Take 1 tablet (5 mg total) by mouth 2 (two) times daily.    . carvedilol (COREG) 25 MG tablet Take 1 tablet (25 mg total) by mouth 2 (two) times daily. 60 tablet 11  . empagliflozin (JARDIANCE) 10 MG TABS tablet Take 10 mg by mouth daily. 14 tablet 0  . metFORMIN (GLUCOPHAGE) 500 MG  tablet Take 1 tablet (500 mg total) by mouth 2 (two) times daily with a meal. 60 tablet 0  . Multiple Vitamin (MULTIVITAMIN WITH MINERALS) TABS tablet Take 1 tablet by mouth daily.    . rosuvastatin (CRESTOR) 20 MG tablet Take 1 tablet (20 mg total) by mouth daily. 90 tablet 3  . sacubitril-valsartan (ENTRESTO) 24-26 MG Take 1 tablet by mouth 2 (two) times daily. 28 tablet 0   No current facility-administered medications for this visit.     Allergies:   Ciprofloxacin    Social History:  The patient  reports that he has quit smoking. He has never used smokeless tobacco. He reports that he does not drink alcohol or use drugs.   Family History:  The patient's family  history includes Arthritis in an other family member; Hyperlipidemia in an other family member; Hypertension in an other family member; Stroke in his mother.    ROS:  Please see the history of present illness.   Otherwise, review of systems are positive for none.   All other systems are reviewed and negative.    PHYSICAL EXAM: VS:  BP 108/60   Pulse (!) 57   Ht 6\' 1"  (1.854 m)   Wt 224 lb (101.6 kg)   SpO2 97%   BMI 29.55 kg/m  , BMI Body mass index is 29.55 kg/m.  I get a heart rate of 82 by auscultation. GENERAL:  Well appearing WM in NAD HEENT:  PERRL, EOMI, sclera are clear. Oropharynx is clear. NECK:  No jugular venous distention, carotid upstroke brisk and symmetric, no bruits, no thyromegaly or adenopathy LUNGS:  Clear to auscultation bilaterally CHEST:  Unremarkable HEART:  IRRR,  PMI not displaced or sustained,S1 and S2 within normal limits, no S3, no S4: no clicks, no rubs, no murmurs ABD:  Soft, nontender. BS +, no masses or bruits. No hepatomegaly, no splenomegaly EXT:  2 + pulses throughout, no edema, no cyanosis no clubbing SKIN:  Warm and dry.  No rashes NEURO:  Alert and oriented x 3. Cranial nerves II through XII intact. PSYCH:  Cognitively intact  EKG:  EKG is not ordered today. The ekg ordered today demonstrates Afib with rate 57. Rightward axis. I have personally reviewed and interpreted this study.    Recent Labs: 07/05/2018: TSH 1.17 09/07/2018: Platelets 364 09/15/2018: Hemoglobin 15.0 09/27/2018: ALT 23; BUN 16; Creatinine, Ser 1.19; Potassium 4.8; Sodium 140    Lipid Panel    Component Value Date/Time   CHOL 124 03/08/2018 1112   TRIG 186.0 (H) 03/08/2018 1112   HDL 42.90 03/08/2018 1112   CHOLHDL 3 03/08/2018 1112   VLDL 37.2 03/08/2018 1112   LDLCALC 44 03/08/2018 1112      Wt Readings from Last 3 Encounters:  10/03/18 224 lb (101.6 kg)  09/20/18 233 lb 9.6 oz (106 kg)  09/15/18 230 lb (104.3 kg)      Other studies  Reviewed: Additional studies/ records that were reviewed today include:  Echo 07/25/18: Study Conclusions  - Left ventricle: The cavity size was normal. There was mild   concentric hypertrophy. Systolic function was mildly to   moderately reduced. The estimated ejection fraction was in the   range of 40% to 45%. Diffuse hypokinesis. Features are consistent   with a pseudonormal left ventricular filling pattern, with   concomitant abnormal relaxation and increased filling pressure   (grade 2 diastolic dysfunction). Doppler parameters are   consistent with elevated ventricular end-diastolic filling   pressure. - Mitral valve:  There was mild regurgitation. - Left atrium: The atrium was moderately dilated. - Right ventricle: The cavity size was normal. Wall thickness was   normal. Systolic function was normal. - Right atrium: The atrium was normal in size. - Tricuspid valve: There was no regurgitation. - Pericardium, extracardiac: There was no pericardial effusion.  Impressions:  - No prior study available for comparison.  Cardiac cath 09/25/18:  RIGHT/LEFT HEART CATH AND CORONARY ANGIOGRAPHY  Conclusion     Ost 1st Diag lesion is 99% stenosed.  Ost Cx to Dist Cx lesion is 25% stenosed.  There is moderate left ventricular systolic dysfunction.  The left ventricular ejection fraction is 35-45% by visual estimate.  LV end diastolic pressure is mildly elevated.  Hemodynamic findings consistent with mild pulmonary hypertension.   1. Left dominant circulation 2. 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 Global LV dysfunction. EF 40%. 4. Mildly elevated LV filling pressures 5. Mild pulmonary venous HTN 6. Low cardiac output.   Plan: will optimize therapy for CHF. Increase Coreg dose to 25 mg bid as Afib rate is not well controlled. Consider switching lisinopril to Entresto. Consider adding Jardiance for DM. Once CHF medications optimized will  plan Tikosyn load and conversion to NSR. OK to resume Eliquis in am.      ASSESSMENT AND PLAN:  1.  Chronic systolic CHF with nonischemic cardiomyopathy. EF 40%.  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 Cardiac cath results as noted. No on Coreg and Entresto. He is not volume overloaded now. No edema and weight down 9 lbs. Unable to titrate Entresto further due to soft BP.  2. Atrial fibrillation persistent and symptomatic. S/p DCCV with early return of AFib. Rate controlled. On Eliquis. We  need to consider AAD therapy with Tikosyn load and repeat DCCV. Will refer back to Afib clinic at this time to plan on admission for Tikosyn load in about one week. I Tikosyn fails would need consideration for ablation. 3. Remote history of WPW s/p ablation 4. HTN controlled 5. Hypercholesterolemia. Will switch Zocor to Crestor 20 mg daily. due to multiple drug interactions with Zocor.  6. DM type 2 on metformin. Recent addition of Jardiance with CHF. Sugars improved. Weight down.   Current medicines are reviewed at length with the patient today.  The patient does not have concerns regarding medicines.  The following changes have been made:  See above  Labs/ tests ordered today include:   No orders of the defined types were placed in this encounter.    Disposition:   FU in one month with me.  Signed, Shayli Altemose [name], MD  10/03/2018 11:51 AM    [facility] [address] [place], [place], [address] Phone [phone], Fax [phone]

## 2018-10-03 ENCOUNTER — Encounter: Payer: Self-pay | Admitting: Cardiology

## 2018-10-03 ENCOUNTER — Other Ambulatory Visit: Payer: Self-pay

## 2018-10-03 ENCOUNTER — Ambulatory Visit (INDEPENDENT_AMBULATORY_CARE_PROVIDER_SITE_OTHER): Payer: Medicare Other | Admitting: Cardiology

## 2018-10-03 VITALS — BP 108/60 | HR 57 | Ht 73.0 in | Wt 224.0 lb

## 2018-10-03 DIAGNOSIS — I5022 Chronic systolic (congestive) heart failure: Secondary | ICD-10-CM | POA: Diagnosis not present

## 2018-10-03 DIAGNOSIS — I4819 Other persistent atrial fibrillation: Secondary | ICD-10-CM | POA: Diagnosis not present

## 2018-10-03 DIAGNOSIS — I10 Essential (primary) hypertension: Secondary | ICD-10-CM

## 2018-10-03 DIAGNOSIS — E7800 Pure hypercholesterolemia, unspecified: Secondary | ICD-10-CM

## 2018-10-03 DIAGNOSIS — E119 Type 2 diabetes mellitus without complications: Secondary | ICD-10-CM

## 2018-10-03 MED ORDER — EMPAGLIFLOZIN 10 MG PO TABS: 10.0000 mg | ORAL_TABLET | Freq: Every day | ORAL | 6 refills | Status: DC

## 2018-10-03 NOTE — Patient Instructions (Signed)
Medication Instructions:  Continue same medications If you need a refill on your cardiac medications before your next appointment, please call your pharmacy.   Lab work: None ordered   Testing/Procedures: None ordered  Follow-Up: At [facility], you and your health needs are our priority.  As part of our continuing mission to provide you with exceptional heart care, we have created designated Provider Care Teams.  These Care Teams include your primary Cardiologist (physician) and Advanced Practice Providers (APPs -  Physician Assistants and Nurse Practitioners) who all work together to provide you with the care you need, when you need it. . Schedule appointment with Atrial Fib clinic in 1 week  . Follow up with Dr.Jordan 1 month after cardioversion

## 2018-10-07 ENCOUNTER — Other Ambulatory Visit: Payer: Self-pay

## 2018-10-07 ENCOUNTER — Ambulatory Visit (INDEPENDENT_AMBULATORY_CARE_PROVIDER_SITE_OTHER): Payer: Medicare Other | Admitting: Family Medicine

## 2018-10-07 ENCOUNTER — Ambulatory Visit: Payer: Self-pay | Admitting: *Deleted

## 2018-10-07 ENCOUNTER — Encounter: Payer: Self-pay | Admitting: Family Medicine

## 2018-10-07 VITALS — BP 96/58 | HR 108 | Temp 97.4°F | Ht 73.0 in | Wt 225.8 lb

## 2018-10-07 DIAGNOSIS — K0889 Other specified disorders of teeth and supporting structures: Secondary | ICD-10-CM | POA: Diagnosis not present

## 2018-10-07 MED ORDER — PENICILLIN V POTASSIUM 500 MG PO TABS: 500.0000 mg | ORAL_TABLET | Freq: Four times a day (QID) | ORAL | 0 refills | Status: DC

## 2018-10-07 NOTE — Patient Instructions (Signed)
Dental Caries, Adult  Dental caries are spots of decay (cavities) in the outer layer of your tooth (enamel). The natural bacteria in your mouth produce acid when breaking down sugary foods and drinks. When you eat or drink a lot of sugary foods and liquids, a lot of acid is produced. The acid destroys the protective enamel of your tooth, leading to tooth decay. It is important to treat your tooth decay as soon as possible. Untreated dental caries can spread decay and lead to painful infection. Brushing regularly with fluoride toothpaste (oral hygiene) and getting regular dental checkups can help prevent dental caries. What are the causes? Dental caries are caused by the acid that is produced when bacteria break down sugary or acidic foods and drinks. What increases the risk? This condition is more likely to develop in young adults. This condition is also more likely to develop in people who:  Drink a lot of sugary liquids, including alcoholic drinks, such as champagne.  Eat a lot of sweets and carbohydrates.  Drink water that is not treated with fluoride.  Have poor oral hygiene.  Have deep grooves in their teeth.  Take certain medicines that decrease saliva. What are the signs or symptoms? Symptoms of dental caries include:  White, brown, or black spots on the teeth.  Pain.  Swollen or bleeding gums. How is this diagnosed? Your dentist may suspect dental caries from your signs and symptoms. The dentist will also do an oral exam. This may include X-rays to confirm the diagnosis. Sometimes lights, a thin probe, and dyes are used to find dental caries (using electrical conductivity or using laser reflection). How is this treated? Treatment for dental caries usually involves a procedure to remove the decay and restore the tooth with a filling or a sealant. Follow these instructions at home:  Practice good oral hygiene. This keeps your mouth and gums healthy. Use fluoride toothpaste to  brush your teeth twice a day, and floss once a day.  If your dentist prescribed an antibiotic medicine to treat an infection, take it as told by your dentist. Do not stop taking the antibiotic even if your condition improves.  Keep all follow-up visits as told by your dentist. This is important. This includes all cleanings. How is this prevented? To prevent dental caries:  Brush your teeth every morning and night with fluoride toothpaste.  Get regular dental cleanings.  If you are at risk of dental caries, wash your mouth with prescription mouthwash (chlorhexidine) and apply topical fluoride to your teeth.  Drink fluoridated water regularly.  Drink water instead of sugary drinks.  Eat healthy meals and snacks. Contact a health care provider if:  You have symptoms of tooth decay. This information is not intended to replace advice given to you by your health care provider. Make sure you discuss any questions you have with your health care provider. Document Released: 04/18/2002 Document Revised: 11/25/2016 Document Reviewed: 02/07/2016 Elsevier Interactive Patient Education  2019 [employer].

## 2018-10-07 NOTE — Telephone Encounter (Signed)
Summary: clinical advice- cold like symptoms / ? tooth infection    Relation to pt: Self  Call back number:[phone] Pharmacy: [id number] - [place], [place] - [phone] (Phone) [phone] (Fax)    Reason for call:  Patient experiencing chills, body aches, possible infected tooth (unable to see dentist until Monday). Patient PCP has no available appointments today, patient declined to see another physician, seeking clinical advice OTC recommendation.      Yesterday patient started tooth ache- pain with touch. Patient states he has new medications and is feeling bad all around. Patient started having chills last night. Joint aches and just doesn't feel good.  Patient to contact dental office to check for on call coverage and will call back if needs. If patient can not be seen - will encourage office visit with alternative provider- patient is hesitant due to his health conditions.  Reason for Disposition . Toothache present > 24 hours    Sudden on set tooth pain- patient states he felt chills all night- none today- has not called on call dentist- recommend patient call dental office for on call advisement- and he will call back if there is no coverage.  Answer Assessment - Initial Assessment Questions 1. LOCATION: "Which tooth is hurting?"  (e.g., right-side/left-side, upper/lower, front/back)     Right incisor 2. ONSET: "When did the toothache start?"  (e.g., hours, days)      Yesterday afternoon 3. SEVERITY: "How bad is the toothache?"  (Scale 1-10; mild, moderate or severe)   - MILD (1-3): doesn't interfere with chewing    - MODERATE (4-7): interferes with chewing, interferes with normal activities, awakens from sleep     - SEVERE (8-10): unable to eat, unable to do any normal activities, excruciating pain        Moderate to severe 4. SWELLING: "Is there any visible swelling of your face?"     No swelling present 5.  OTHER SYMPTOMS: "Do you have any other symptoms?" (e.g., fever)     Chills last night 6. PREGNANCY: "Is there any chance you are pregnant?" "When was your last menstrual period?"     n/a  Protocols used: TOOTHACHE-A-AH

## 2018-10-07 NOTE — Telephone Encounter (Signed)
Please see messages.

## 2018-10-07 NOTE — Telephone Encounter (Signed)
I can see him at end of schedule

## 2018-10-07 NOTE — Telephone Encounter (Signed)
I have him coming in at 2:40pm today. Called patient and he is aware and verbalized that this time would be fine.

## 2018-10-07 NOTE — Progress Notes (Signed)
  Subjective:     Patient ID: Christopher Navarro, male   DOB: 1950-08-21, 68 y.o.   MRN: [id number]  HPI  Patient seen with possible dental pain.  Started few days ago with some gum pain around his right upper incisor.  He noticed little bit of mild swelling.  His pain was worse yesterday and slightly better today.  Is had some chills but no fever.  Generally feeling poorly.  Recent diagnosis A. fib.  He went in for further evaluation.  Echocardiogram revealed ejection fraction 40 to 45%.  Subsequent cardiac catheter revealed minimal nonobstructive CAD.  He is now on carvedilol, Entresto, and Eliquis.  He is followed by A. fib clinic and they are looking at [facility] therapy soon  He denies any recent sinus congestive symptoms.  No headaches.  Past Medical History:  Diagnosis Date  . 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 Diabetes mellitus type II   . Dyslipidemia   . [place], [place] 06/20/2009  . HYPERTENSION 06/20/2009   Past Surgical History:  Procedure Laterality Date  . ablasion  1997  . BACK SURGERY  2006  . CARDIOVERSION N/A 08/15/2018   Procedure: CARDIOVERSION;  Surgeon: Fay Records, MD;  Location: [facility] ENDOSCOPY;  Service: Cardiovascular;  Laterality: N/A;  . CARPAL TUNNEL RELEASE  2004   Bilateral  . KNEE ARTHROSCOPY  2002  . RIGHT/LEFT HEART CATH AND CORONARY ANGIOGRAPHY N/A 09/15/2018   Procedure: RIGHT/LEFT HEART CATH AND CORONARY ANGIOGRAPHY;  Surgeon: [name], Peter M, MD;  Location: [place] CV LAB;  Service: Cardiovascular;  Laterality: N/A;    reports that he has quit smoking. He has never used smokeless tobacco. He reports that he does not drink alcohol or use drugs. family history includes Arthritis in an other family member; Hyperlipidemia in an other family member; Hypertension in an other family member; Stroke in his mother. Allergies  Allergen Reactions  . Ciprofloxacin Hives and Rash    Review of Systems  Constitutional: Positive  for chills and fatigue. Negative for fever.  HENT: Positive for congestion. Negative for sinus pressure, sinus pain and sore throat.   Respiratory: Negative for cough.        Objective:   Physical Exam Constitutional:      Appearance: Normal appearance.  HENT:     Mouth/Throat:     Comments: Only mild gum edema and erythema around the right upper incisor region.  No visible purulence Neck:     Musculoskeletal: Neck supple.  Cardiovascular:     Comments: Irregular rhythm consistent with his atrial fibrillation Pulmonary:     Effort: Pulmonary effort is normal.     Breath sounds: Normal breath sounds.  Neurological:     Mental Status: He is alert.        Assessment:     Patient presents with several day history of right upper tooth pain.  Probable early dental infection    Plan:     -Penicillin V 500 mg 4 times daily for 7 days -Follow-up with his dentist for any persistent symptoms  [name] MD [facility]

## 2018-10-10 ENCOUNTER — Other Ambulatory Visit: Payer: Self-pay

## 2018-10-10 MED ORDER — EMPAGLIFLOZIN 10 MG PO TABS: 10.0000 mg | ORAL_TABLET | Freq: Every day | ORAL | 3 refills | Status: DC

## 2018-10-10 NOTE — Telephone Encounter (Signed)
Rx(s) sent to pharmacy electronically.  

## 2018-10-11 ENCOUNTER — Other Ambulatory Visit: Payer: Self-pay

## 2018-10-11 MED ORDER — SACUBITRIL-VALSARTAN 24-26 MG PO TABS: 1.0000 | ORAL_TABLET | Freq: Two times a day (BID) | ORAL | 0 refills | Status: DC

## 2018-10-12 ENCOUNTER — Telehealth: Payer: Self-pay | Admitting: Pharmacist

## 2018-10-12 ENCOUNTER — Ambulatory Visit (HOSPITAL_COMMUNITY)
Admission: RE | Admit: 2018-10-12 | Discharge: 2018-10-12 | Disposition: A | Discharge status: Home / Self Care | Payer: Medicare Other | Source: Ambulatory Visit | Attending: Nurse Practitioner | Admitting: Nurse Practitioner

## 2018-10-12 VITALS — BP 118/78 | HR 108 | Ht 73.0 in | Wt 224.0 lb

## 2018-10-12 DIAGNOSIS — Z7901 Long term (current) use of anticoagulants: Secondary | ICD-10-CM | POA: Insufficient documentation

## 2018-10-12 DIAGNOSIS — Z79899 Other long term (current) drug therapy: Secondary | ICD-10-CM | POA: Insufficient documentation

## 2018-10-12 DIAGNOSIS — R9431 Abnormal electrocardiogram [ECG] [EKG]: Secondary | ICD-10-CM | POA: Insufficient documentation

## 2018-10-12 DIAGNOSIS — E785 Hyperlipidemia, unspecified: Secondary | ICD-10-CM | POA: Insufficient documentation

## 2018-10-12 DIAGNOSIS — I4819 Other persistent atrial fibrillation: Secondary | ICD-10-CM | POA: Insufficient documentation

## 2018-10-12 DIAGNOSIS — E119 Type 2 diabetes mellitus without complications: Secondary | ICD-10-CM | POA: Insufficient documentation

## 2018-10-12 DIAGNOSIS — Z87891 Personal history of nicotine dependence: Secondary | ICD-10-CM | POA: Diagnosis not present

## 2018-10-12 DIAGNOSIS — Z8349 Family history of other endocrine, nutritional and metabolic diseases: Secondary | ICD-10-CM | POA: Diagnosis not present

## 2018-10-12 DIAGNOSIS — Z881 Allergy status to other antibiotic agents status: Secondary | ICD-10-CM | POA: Diagnosis not present

## 2018-10-12 DIAGNOSIS — I10 Essential (primary) hypertension: Secondary | ICD-10-CM | POA: Insufficient documentation

## 2018-10-12 DIAGNOSIS — Z8249 Family history of ischemic heart disease and other diseases of the circulatory system: Secondary | ICD-10-CM | POA: Insufficient documentation

## 2018-10-12 LAB — BASIC METABOLIC PANEL
ANION GAP: 8 (ref 5–15)
BUN: 12 mg/dL (ref 8–23)
CHLORIDE: 105 mmol/L (ref 98–111)
CO2: 26 mmol/L (ref 22–32)
Calcium: 9.4 mg/dL (ref 8.9–10.3)
Creatinine, Ser: 0.92 mg/dL (ref 0.61–1.24)
GFR calc Af Amer: >60 mL/min (ref >60)
GFR calc non Af Amer: >60 mL/min (ref >60)
Glucose, Bld: 115 mg/dL — ABNORMAL HIGH (ref 70–99)
Potassium: 3.7 mmol/L (ref 3.5–5.1)
Sodium: 139 mmol/L (ref 135–145)

## 2018-10-12 LAB — MAGNESIUM: Magnesium: 2.1 mg/dL (ref 1.7–2.4)

## 2018-10-12 NOTE — Telephone Encounter (Signed)
Medication list reviewed in anticipation of upcoming Tikosyn initiation. Patient is not taking any contraindicated or QTc prolonging medications.   Patient is anticoagulated on Eliquis on the appropriate dose. Please ensure that patient has not missed any anticoagulation doses in the 3 weeks prior to Tikosyn initiation.   Patient will need to be counseled to avoid use of Benadryl while on Tikosyn and in the 2-3 days prior to Tikosyn initiation.  Potassium was 3.7 on today's labs. This will need to be repelated to at least 4 prior to admission. No magnesium supplementation is needed as today's level was >1.8.

## 2018-10-12 NOTE — Addendum Note (Signed)
Encounter addended by: Sherran Needs, NP on: 10/12/2018 12:42 PM  Actions taken: Clinical Note Signed

## 2018-10-12 NOTE — Progress Notes (Addendum)
Primary Care Physician: [name], MD Referring Physician: Dr. Gwenlyn Found  Cardiologist: Dr. [name]   Christopher Navarro is a 68 y.o. male with a h/o HTN, DM, that was initially  referred to the afib clinic per Dr. Gwenlyn Found. He was found to be in afib on a return visit to [facility] when  irregular  heart beat was noted.He was started on eliquis 5 mg bid and continued on verapamil. He was then seen by Dr. Gwenlyn Found, echo obtained and he was set up for cardioversion 08/15/17.   Unfortunately, he had ERAF, after successful cardioversion. He was in SR for several days and could tell that he felt so much better. He is interested in pursing AAD. He was seen in the afib clinic and Tikosyn admit was planned. He then decided to get a second cardiology opinion with Dr. [name]. He had a LHC and he had Ost 1st diagonal 99% blocked and Ost Cx to Dist Cx lesion 25% stenosed. His EF was 35-45 %. It was felt that his CAD was out of proportion to his LVD. His meds were maximized for LVD and he is now back in the clinic to continue plans to admit for tikosyn.   Today, he denies symptoms of palpitations, chest pain, shortness of breath, orthopnea, PND, lower extremity edema, dizziness, presyncope, syncope, or neurologic sequela. The patient is tolerating medications without difficulties and is otherwise without complaint today.   Past Medical History:  Diagnosis Date  . 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 Diabetes mellitus type II   . Dyslipidemia   . [place], [place] 06/20/2009  . HYPERTENSION 06/20/2009   Past Surgical History:  Procedure Laterality Date  . ablasion  1997  . BACK SURGERY  2006  . CARDIOVERSION N/A 08/15/2018   Procedure: CARDIOVERSION;  Surgeon: Fay Records, MD;  Location: [facility] ENDOSCOPY;  Service: Cardiovascular;  Laterality: N/A;  . CARPAL TUNNEL RELEASE  2004   Bilateral  . KNEE ARTHROSCOPY  2002  . RIGHT/LEFT HEART CATH AND CORONARY ANGIOGRAPHY N/A 09/15/2018   Procedure: RIGHT/LEFT HEART CATH AND CORONARY ANGIOGRAPHY;  Surgeon: [name], Peter M, MD;  Location: [place] CV LAB;  Service: Cardiovascular;  Laterality: N/A;    Current Outpatient Medications  Medication Sig Dispense Refill  . apixaban (ELIQUIS) 5 MG TABS tablet Take 1 tablet (5 mg total) by mouth 2 (two) times daily.    . carvedilol (COREG) 25 MG tablet Take 1 tablet (25 mg total) by mouth 2 (two) times daily. 60 tablet 11  . empagliflozin (JARDIANCE) 10 MG TABS tablet Take 10 mg by mouth daily. 90 tablet 3  . metFORMIN (GLUCOPHAGE) 500 MG tablet Take 1 tablet (500 mg total) by mouth 2 (two) times daily with a meal. 60 tablet 0  . Multiple Vitamin (MULTIVITAMIN WITH MINERALS) TABS tablet Take 1 tablet by mouth daily.    . penicillin v potassium (VEETID) 500 MG tablet Take 1 tablet (500 mg total) by mouth 4 (four) times daily. 28 tablet 0  . rosuvastatin (CRESTOR) 20 MG tablet Take 1 tablet (20 mg total) by mouth daily. 90 tablet 3  . sacubitril-valsartan (ENTRESTO) 24-26 MG Take 1 tablet by mouth 2 (two) times daily. 28 tablet 0   No current facility-administered medications for this encounter.     Allergies  Allergen Reactions  . Ciprofloxacin Hives and Rash    Social History   Socioeconomic History  . Marital status: Married  Spouse name: Not on file  . Number of children: Not on file  . Years of education: Not on file  . Highest education level: Not on file  Occupational History  . Not on file  Social Needs  . Financial resource strain: Not on file  . Food insecurity:    Worry: Not on file    Inability: Not on file  . Transportation needs:    Medical: Not on file    Non-medical: Not on file  Tobacco Use  . Smoking status: Former Research scientist (life sciences)  . Smokeless tobacco: Never Used  Substance and Sexual Activity  . Alcohol use: No  . Drug use: No  . Sexual activity: Not on file  Lifestyle  . Physical activity:    Days per week: Not on file    Minutes per session: Not  on file  . Stress: Not on file  Relationships  . Social connections:    Talks on phone: Not on file    Gets together: Not on file    Attends religious service: Not on file    Active member of club or organization: Not on file    Attends meetings of clubs or organizations: Not on file    Relationship status: Not on file  . Intimate partner violence:    Fear of current or ex partner: Not on file    Emotionally abused: Not on file    Physically abused: Not on file    Forced sexual activity: Not on file  Other Topics Concern  . Not on file  Social History Narrative  . Not on file    Family History  Problem Relation Age of Onset  . Arthritis Other   . Hyperlipidemia Other   . Hypertension Other   . Stroke Mother     ROS- All systems are reviewed and negative except as per the HPI above  Physical Exam: Vitals:   10/12/18 1133  BP: 118/78  Pulse: (!) 108  Weight: 101.6 kg  Height: 6\' 1"  (1.854 m)   Wt Readings from Last 3 Encounters:  10/12/18 101.6 kg  10/07/18 102.4 kg  10/03/18 101.6 kg    Labs: Lab Results  Component Value Date   NA 140 09/27/2018   K 4.8 09/27/2018   CL 102 09/27/2018   CO2 22 09/27/2018   GLUCOSE 150 (H) 09/27/2018   BUN 16 09/27/2018   CREATININE 1.19 09/27/2018   CALCIUM 9.3 09/27/2018   No results found for: INR Lab Results  Component Value Date   CHOL 124 03/08/2018   HDL 42.90 03/08/2018   LDLCALC 44 03/08/2018   TRIG 186.0 (H) 03/08/2018     GEN- The patient is well appearing, alert and oriented x 3 today.   Head- normocephalic, atraumatic Eyes-  Sclera clear, conjunctiva pink Ears- hearing intact Oropharynx- clear Neck- supple, no JVP Lymph- no cervical lymphadenopathy Lungs- Clear to ausculation bilaterally, normal work of breathing Heart-irregular rate and rhythm, no murmurs, rubs or gallops, PMI not laterally displaced GI- soft, NT, ND, + BS Extremities- no clubbing, cyanosis, or edema MS- no significant deformity  or atrophy Skin- no rash or lesion Psych- euthymic mood, full affect Neuro- strength and sensation are intact  EKG-afib with rvr at 108 bpm, qrs int 90 ms, qtc 458 ms EchoStudy Conclusions  - Left ventricle: The cavity size was normal. There was mild   concentric hypertrophy. Systolic function was mildly to   moderately reduced. The estimated ejection fraction was in the  range of 40% to 45%. Diffuse hypokinesis. Features are consistent   with a pseudonormal left ventricular filling pattern, with   concomitant abnormal relaxation and increased filling pressure   (grade 2 diastolic dysfunction). Doppler parameters are   consistent with elevated ventricular end-diastolic filling   pressure. - Mitral valve: There was mild regurgitation. - Left atrium: The atrium was moderately dilated. - Right ventricle: The cavity size was normal. Wall thickness was   normal. Systolic function was normal. - Right atrium: The atrium was normal in size. - Tricuspid valve: There was no regurgitation. - Pericardium, extracardiac: There was no pericardial effusion.     LHC- Ost 1st Diag lesion is 99% stenosed.  Ost Cx to Dist Cx lesion is 25% stenosed.  There is moderate left ventricular systolic dysfunction.  The left ventricular ejection fraction is 35-45% by visual estimate.  LV end diastolic pressure is mildly elevated.  Hemodynamic findings consistent with mild pulmonary hypertension.   1. Left dominant circulation 2. 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 Global LV dysfunction. EF 40%. 4. Mildly elevated LV filling pressures 5. Mild pulmonary venous HTN 6. Low cardiac output.   Assessment and Plan: 1.Persistent symptomatic afib with moderate LV dysfunction Successful cardioversion but ERAF Felt better in SR  LHC showed mild non obstructive CAD other than a very small first diagonal branch, out of proportion to his LVD Possibly LVD is TCM  He would  like to pursue restoring SR with tikosyn Aware of price of drug Continue eliquis 5 mg bid for CHA2DS2VASc score of  4, states uninterrupted for the last 3 weeks   No benadryl use PharmD ro screen drugs Bmet/mag today qtc is SR 450 ms, in afib have seen from 401 to 458 ms( today )  Plan to admit 3/10 Return to clinic as that time    Butch Penny C. Jaylynn Siefert, [facility] [address] [place], [place] [address] [phone]

## 2018-10-13 ENCOUNTER — Other Ambulatory Visit (HOSPITAL_COMMUNITY): Payer: Self-pay | Admitting: *Deleted

## 2018-10-13 MED ORDER — POTASSIUM CHLORIDE CRYS ER 20 MEQ PO TBCR: 20.0000 meq | EXTENDED_RELEASE_TABLET | Freq: Every day | ORAL | 1 refills | Status: DC

## 2018-10-14 ENCOUNTER — Other Ambulatory Visit: Payer: Self-pay

## 2018-10-14 MED ORDER — SACUBITRIL-VALSARTAN 24-26 MG PO TABS: 1.0000 | ORAL_TABLET | Freq: Two times a day (BID) | ORAL | 3 refills | Status: DC

## 2018-10-14 NOTE — Telephone Encounter (Signed)
Rx(s) sent to pharmacy electronically.  

## 2018-10-16 ENCOUNTER — Other Ambulatory Visit: Payer: Self-pay | Admitting: Family Medicine

## 2018-10-18 ENCOUNTER — Ambulatory Visit (HOSPITAL_COMMUNITY)
Admission: RE | Admit: 2018-10-18 | Discharge: 2018-10-18 | Disposition: A | Discharge status: Home / Self Care | Payer: Medicare Other | Source: Ambulatory Visit | Attending: Nurse Practitioner | Admitting: Nurse Practitioner

## 2018-10-18 ENCOUNTER — Encounter (HOSPITAL_COMMUNITY): Payer: Self-pay | Admitting: Nurse Practitioner

## 2018-10-18 ENCOUNTER — Other Ambulatory Visit: Payer: Self-pay

## 2018-10-18 ENCOUNTER — Inpatient Hospital Stay (HOSPITAL_COMMUNITY)
Admission: AD | Admit: 2018-10-18 | Discharge: 2018-10-21 | DRG: 310 | Disposition: A | Discharge status: Home / Self Care | Payer: Medicare Other | Attending: Internal Medicine | Admitting: Internal Medicine

## 2018-10-18 VITALS — BP 102/68 | HR 132 | Ht 73.0 in | Wt 221.0 lb

## 2018-10-18 DIAGNOSIS — Z7901 Long term (current) use of anticoagulants: Secondary | ICD-10-CM | POA: Diagnosis not present

## 2018-10-18 DIAGNOSIS — I110 Hypertensive heart disease with heart failure: Secondary | ICD-10-CM | POA: Diagnosis present

## 2018-10-18 DIAGNOSIS — I4819 Other persistent atrial fibrillation: Secondary | ICD-10-CM | POA: Diagnosis present

## 2018-10-18 DIAGNOSIS — E785 Hyperlipidemia, unspecified: Secondary | ICD-10-CM | POA: Diagnosis present

## 2018-10-18 DIAGNOSIS — I255 Ischemic cardiomyopathy: Secondary | ICD-10-CM | POA: Diagnosis present

## 2018-10-18 DIAGNOSIS — Z823 Family history of stroke: Secondary | ICD-10-CM | POA: Diagnosis not present

## 2018-10-18 DIAGNOSIS — E119 Type 2 diabetes mellitus without complications: Secondary | ICD-10-CM | POA: Diagnosis present

## 2018-10-18 DIAGNOSIS — I2510 Atherosclerotic heart disease of native coronary artery without angina pectoris: Secondary | ICD-10-CM | POA: Diagnosis present

## 2018-10-18 DIAGNOSIS — I509 Heart failure, unspecified: Secondary | ICD-10-CM | POA: Diagnosis present

## 2018-10-18 DIAGNOSIS — Z87891 Personal history of nicotine dependence: Secondary | ICD-10-CM

## 2018-10-18 DIAGNOSIS — E669 Obesity, unspecified: Secondary | ICD-10-CM | POA: Diagnosis present

## 2018-10-18 DIAGNOSIS — I4891 Unspecified atrial fibrillation: Secondary | ICD-10-CM | POA: Diagnosis present

## 2018-10-18 DIAGNOSIS — Z7984 Long term (current) use of oral hypoglycemic drugs: Secondary | ICD-10-CM | POA: Diagnosis not present

## 2018-10-18 DIAGNOSIS — Z881 Allergy status to other antibiotic agents status: Secondary | ICD-10-CM | POA: Diagnosis not present

## 2018-10-18 DIAGNOSIS — Z6830 Body mass index (BMI) 30.0-30.9, adult: Secondary | ICD-10-CM

## 2018-10-18 DIAGNOSIS — Z79899 Other long term (current) drug therapy: Secondary | ICD-10-CM | POA: Diagnosis not present

## 2018-10-18 DIAGNOSIS — I25118 Atherosclerotic heart disease of native coronary artery with other forms of angina pectoris: Secondary | ICD-10-CM | POA: Diagnosis not present

## 2018-10-18 DIAGNOSIS — Z8249 Family history of ischemic heart disease and other diseases of the circulatory system: Secondary | ICD-10-CM | POA: Diagnosis not present

## 2018-10-18 LAB — BASIC METABOLIC PANEL
ANION GAP: 9 (ref 5–15)
BUN: 15 mg/dL (ref 8–23)
CO2: 23 mmol/L (ref 22–32)
Calcium: 9.8 mg/dL (ref 8.9–10.3)
Chloride: 106 mmol/L (ref 98–111)
Creatinine, Ser: 1.21 mg/dL (ref 0.61–1.24)
GFR calc Af Amer: >60 mL/min (ref >60)
GFR calc non Af Amer: >60 mL/min (ref >60)
Glucose, Bld: 134 mg/dL — ABNORMAL HIGH (ref 70–99)
Potassium: 4 mmol/L (ref 3.5–5.1)
Sodium: 138 mmol/L (ref 135–145)

## 2018-10-18 LAB — MAGNESIUM: MAGNESIUM: 2.1 mg/dL (ref 1.7–2.4)

## 2018-10-18 LAB — GLUCOSE, CAPILLARY
Glucose-Capillary: 114 mg/dL — ABNORMAL HIGH (ref 70–99)
Glucose-Capillary: 98 mg/dL (ref 70–99)

## 2018-10-18 MED ORDER — APIXABAN 5 MG PO TABS
5.0000 mg | ORAL_TABLET | Freq: Two times a day (BID) | ORAL | Status: DC
  Administered 2018-10-18 – 2018-10-21 (×6): 5 mg via ORAL
  Filled 2018-10-18 (×6): qty 1

## 2018-10-18 MED ORDER — EMPAGLIFLOZIN 10 MG PO TABS: 10.0000 mg | ORAL_TABLET | Freq: Every day | ORAL | Status: DC

## 2018-10-18 MED ORDER — ROSUVASTATIN CALCIUM 20 MG PO TABS
20.0000 mg | ORAL_TABLET | Freq: Every day | ORAL | Status: DC
  Administered 2018-10-19 – 2018-10-21 (×3): 20 mg via ORAL
  Filled 2018-10-18 (×3): qty 1

## 2018-10-18 MED ORDER — DOFETILIDE 500 MCG PO CAPS
500.0000 ug | ORAL_CAPSULE | Freq: Two times a day (BID) | ORAL | Status: DC
  Administered 2018-10-18 – 2018-10-21 (×6): 500 ug via ORAL
  Filled 2018-10-18 (×6): qty 1

## 2018-10-18 MED ORDER — SACUBITRIL-VALSARTAN 24-26 MG PO TABS
1.0000 | ORAL_TABLET | Freq: Two times a day (BID) | ORAL | Status: DC
  Administered 2018-10-18 – 2018-10-21 (×6): 1 via ORAL
  Filled 2018-10-18 (×6): qty 1

## 2018-10-18 MED ORDER — CARVEDILOL 25 MG PO TABS
25.0000 mg | ORAL_TABLET | Freq: Two times a day (BID) | ORAL | Status: DC
  Administered 2018-10-18: 25 mg via ORAL
  Filled 2018-10-18: qty 1

## 2018-10-18 MED ORDER — SODIUM CHLORIDE 0.9% FLUSH: 3.0000 mL | INTRAVENOUS | Status: DC | PRN

## 2018-10-18 MED ORDER — METFORMIN HCL 500 MG PO TABS
500.0000 mg | ORAL_TABLET | Freq: Two times a day (BID) | ORAL | Status: DC
  Administered 2018-10-18 – 2018-10-21 (×5): 500 mg via ORAL
  Filled 2018-10-18 (×5): qty 1

## 2018-10-18 MED ORDER — SODIUM CHLORIDE 0.9 % IV SOLN: 250.0000 mL | INTRAVENOUS | Status: DC | PRN

## 2018-10-18 MED ORDER — POTASSIUM CHLORIDE CRYS ER 20 MEQ PO TBCR
20.0000 meq | EXTENDED_RELEASE_TABLET | Freq: Every day | ORAL | Status: DC
  Administered 2018-10-18 – 2018-10-21 (×4): 20 meq via ORAL
  Filled 2018-10-18 (×4): qty 1

## 2018-10-18 MED ORDER — CANAGLIFLOZIN 100 MG PO TABS
100.0000 mg | ORAL_TABLET | Freq: Every day | ORAL | Status: DC
  Administered 2018-10-19 – 2018-10-21 (×2): 100 mg via ORAL
  Filled 2018-10-18 (×3): qty 1

## 2018-10-18 NOTE — Progress Notes (Signed)
Notified by CCMD pt had 4 bts WQRS. Pt asymptomatic-will continue to monitor. Jessie Foot

## 2018-10-18 NOTE — Progress Notes (Signed)
Primary Care Physician: [name], MD Referring Physician: Dr. Gwenlyn Found  Cardiologist: Dr. [name]   Murrell Christopher Navarro is Christopher 68 y.o. male with Christopher h/o HTN, DM, that was initially  referred to the afib clinic per Dr. Gwenlyn Found. He was found to be in afib on Christopher return visit to [facility] when  irregular  heart beat was noted.He was started on eliquis 5 mg bid and continued on verapamil. He was then seen by Dr. Gwenlyn Found, echo obtained and he was set up for cardioversion 08/15/17.   Unfortunately, he had ERAF, after successful cardioversion. He was in SR for several days and could tell that he felt so much better. He is interested in pursing AAD. He was seen in the afib clinic and Tikosyn admit was planned. He then decided to get Christopher second cardiology opinion with Dr. [name]. He had Christopher LHC and he had Ost 1st diagonal 99% blocked and Ost Cx to Dist Cx lesion 25% stenosed. His EF was 35-45 %. It was felt that his CAD was out of proportion to his LVD. His meds were maximized for LVD and he is now back in the clinic to continue plans to admit for tikosyn.   F/u afib clinic 3/10 for tikosyn admit.   Today, he denies symptoms of palpitations, chest pain, shortness of breath, orthopnea, PND, lower extremity edema, dizziness, presyncope, syncope, or neurologic sequela. The patient is tolerating medications without difficulties and is otherwise without complaint today.   Past Medical History:  Diagnosis Date  . 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 Diabetes mellitus type II   . Dyslipidemia   . [place], [place] 06/20/2009  . HYPERTENSION 06/20/2009   Past Surgical History:  Procedure Laterality Date  . ablasion  1997  . BACK SURGERY  2006  . CARDIOVERSION N/Christopher 08/15/2018   Procedure: CARDIOVERSION;  Surgeon: Fay Records, MD;  Location: [facility] ENDOSCOPY;  Service: Cardiovascular;  Laterality: N/Christopher;  . CARPAL TUNNEL RELEASE  2004   Bilateral  . KNEE ARTHROSCOPY  2002  . RIGHT/LEFT HEART CATH  AND CORONARY ANGIOGRAPHY N/Christopher 09/15/2018   Procedure: RIGHT/LEFT HEART CATH AND CORONARY ANGIOGRAPHY;  Surgeon: [name], Peter M, MD;  Location: [place] CV LAB;  Service: Cardiovascular;  Laterality: N/Christopher;    Current Outpatient Medications  Medication Sig Dispense Refill  . apixaban (ELIQUIS) 5 MG TABS tablet Take 1 tablet (5 mg total) by mouth 2 (two) times daily.    . carvedilol (COREG) 25 MG tablet Take 1 tablet (25 mg total) by mouth 2 (two) times daily. 60 tablet 11  . empagliflozin (JARDIANCE) 10 MG TABS tablet Take 10 mg by mouth daily. 90 tablet 3  . metFORMIN (GLUCOPHAGE) 500 MG tablet TAKE 1 TABLET BY MOUTH TWICE Christopher DAY WITH Christopher MEAL **NEED OFFICE VISIT** 60 tablet 0  . Multiple Vitamin (MULTIVITAMIN WITH MINERALS) TABS tablet Take 1 tablet by mouth daily.    . potassium chloride SA (K-DUR,KLOR-CON) 20 MEQ tablet Take 1 tablet (20 mEq total) by mouth daily. 90 tablet 1  . rosuvastatin (CRESTOR) 20 MG tablet Take 1 tablet (20 mg total) by mouth daily. 90 tablet 3  . sacubitril-valsartan (ENTRESTO) 24-26 MG Take 1 tablet by mouth 2 (two) times daily. 180 tablet 3   No current facility-administered medications for this encounter.     Allergies  Allergen Reactions  . Ciprofloxacin Hives and Rash    Social History   Socioeconomic History  .  Marital status: Married    Spouse name: Not on file  . Number of children: Not on file  . Years of education: Not on file  . Highest education level: Not on file  Occupational History  . Not on file  Social Needs  . Financial resource strain: Not on file  . Food insecurity:    Worry: Not on file    Inability: Not on file  . Transportation needs:    Medical: Not on file    Non-medical: Not on file  Tobacco Use  . Smoking status: Former Research scientist (life sciences)  . Smokeless tobacco: Never Used  Substance and Sexual Activity  . Alcohol use: No  . Drug use: No  . Sexual activity: Not on file  Lifestyle  . Physical activity:    Days per week: Not on  file    Minutes per session: Not on file  . Stress: Not on file  Relationships  . Social connections:    Talks on phone: Not on file    Gets together: Not on file    Attends religious service: Not on file    Active member of club or organization: Not on file    Attends meetings of clubs or organizations: Not on file    Relationship status: Not on file  . Intimate partner violence:    Fear of current or ex partner: Not on file    Emotionally abused: Not on file    Physically abused: Not on file    Forced sexual activity: Not on file  Other Topics Concern  . Not on file  Social History Narrative  . Not on file    Family History  Problem Relation Age of Onset  . Arthritis Other   . Hyperlipidemia Other   . Hypertension Other   . Stroke Mother     ROS- All systems are reviewed and negative except as per the HPI above  Physical Exam: Vitals:   10/18/18 0931  Weight: 100.2 kg  Height: 6\' 1"  (1.854 m)   Wt Readings from Last 3 Encounters:  10/18/18 100.2 kg  10/12/18 101.6 kg  10/07/18 102.4 kg    Labs: Lab Results  Component Value Date   NA 139 10/12/2018   K 3.7 10/12/2018   CL 105 10/12/2018   CO2 26 10/12/2018   GLUCOSE 115 (H) 10/12/2018   BUN 12 10/12/2018   CREATININE 0.92 10/12/2018   CALCIUM 9.4 10/12/2018   MG 2.1 10/12/2018   No results found for: INR Lab Results  Component Value Date   CHOL 124 03/08/2018   HDL 42.90 03/08/2018   LDLCALC 44 03/08/2018   TRIG 186.0 (H) 03/08/2018     GEN- The patient is well appearing, alert and oriented x 3 today.   Head- normocephalic, atraumatic Eyes-  Sclera clear, conjunctiva pink Ears- hearing intact Oropharynx- clear Neck- supple, no JVP Lymph- no cervical lymphadenopathy Lungs- Clear to ausculation bilaterally, normal work of breathing Heart-irregular rate and rhythm, no murmurs, rubs or gallops, PMI not laterally displaced GI- soft, NT, ND, + BS Extremities- no clubbing, cyanosis, or edema MS-  no significant deformity or atrophy Skin- no rash or lesion Psych- euthymic mood, full affect Neuro- strength and sensation are intact  EKG-afib with rvr at 132 bpm, qrs int 86 ms, qtc 474 ms EchoStudy Conclusions  - Left ventricle: The cavity size was normal. There was mild   concentric hypertrophy. Systolic function was mildly to   moderately reduced. The estimated ejection fraction was  in the   range of 40% to 45%. Diffuse hypokinesis. Features are consistent   with Christopher pseudonormal left ventricular filling pattern, with   concomitant abnormal relaxation and increased filling pressure   (grade 2 diastolic dysfunction). Doppler parameters are   consistent with elevated ventricular end-diastolic filling   pressure. - Mitral valve: There was mild regurgitation. - Left atrium: The atrium was moderately dilated. - Right ventricle: The cavity size was normal. Wall thickness was   normal. Systolic function was normal. - Right atrium: The atrium was normal in size. - Tricuspid valve: There was no regurgitation. - Pericardium, extracardiac: There was no pericardial effusion.     LHC- Ost 1st Diag lesion is 99% stenosed.  Ost Cx to Dist Cx lesion is 25% stenosed.  There is moderate left ventricular systolic dysfunction.  The left ventricular ejection fraction is 35-45% by visual estimate.  LV end diastolic pressure is mildly elevated.  Hemodynamic findings consistent with mild pulmonary hypertension.   1. Left dominant circulation 2. Nonobstructive CAD except for Christopher very small first diagonal branch 3. Global LV dysfunction. EF 40%. 4. Mildly elevated LV filling pressures 5. Mild pulmonary venous HTN 6. Low cardiac output.   Assessment and Plan: 1.Persistent symptomatic afib with moderate LV dysfunction Successful cardioversion but ERAF Felt better in SR  LHC showed mild non obstructive CAD other than Christopher very small first diagonal branch, out of proportion to his LVD Probably   LVD is TMC He would like to pursue restoring SR with tikosyn Aware of price of drug Continue eliquis 5 mg bid for CHA2DS2VASc score of  4, states uninterrupted for the last 3 weeks   No benadryl use PharmD screened drugs and is not on any QTC prolonging drugs qtc is SR 450 ms, in afib have seen from 401 to 478 ms( longest today ) but he has RVR at 135 bpm Bmet/mag today with good K+/mag levels for tiksoyn admit, crcl cal at 80.54 ml/min     Butch Penny C. Torria Fromer, [facility] [address] [place], [place] [address] [phone]

## 2018-10-18 NOTE — Progress Notes (Addendum)
H&P        Primary Care Physician: [name], MD Referring Physician: Dr. Gwenlyn Found  Cardiologist: Dr. [name]   Christopher Navarro is a 68 y.o. male with a h/o HTN, DM, that was initially  referred to the afib clinic per Dr. Gwenlyn Found. He was found to be in afib on a return visit to [facility] when  irregular  heart beat was noted.He was started on eliquis 5 mg bid and continued on verapamil. He was then seen by Dr. Gwenlyn Found, echo obtained and he was set up for cardioversion 08/15/17.   Unfortunately, he had ERAF, after successful cardioversion. He was in SR for several days and could tell that he felt so much better. He is interested in pursing AAD. He was seen in the afib clinic and Tikosyn admit was planned. He then decided to get a second cardiology opinion with Dr. [name]. He had a LHC and he had Ost 1st diagonal 99% blocked and Ost Cx to Dist Cx lesion 25% stenosed. His EF was 35-45 %. It was felt that his CAD was out of proportion to his LVD. His meds were maximized for LVD and he is now back in the clinic to continue plans to admit for tikosyn.   F/u afib clinic 3/10 for tikosyn admit.   Today, he denies symptoms of palpitations, chest pain, shortness of breath, orthopnea, PND, lower extremity edema, dizziness, presyncope, syncope, or neurologic sequela. The patient is tolerating medications without difficulties and is otherwise without complaint today.   Past Medical History:  Diagnosis Date  . 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 Diabetes mellitus type II   . Dyslipidemia   . [place], [place] 06/20/2009  . HYPERTENSION 06/20/2009   Past Surgical History:  Procedure Laterality Date  . ablasion  1997  . BACK SURGERY  2006  . CARDIOVERSION N/A 08/15/2018   Procedure: CARDIOVERSION;  Surgeon: Fay Records, MD;  Location: [facility] ENDOSCOPY;  Service: Cardiovascular;  Laterality: N/A;  . CARPAL TUNNEL RELEASE  2004   Bilateral  . KNEE ARTHROSCOPY  2002  .  RIGHT/LEFT HEART CATH AND CORONARY ANGIOGRAPHY N/A 09/15/2018   Procedure: RIGHT/LEFT HEART CATH AND CORONARY ANGIOGRAPHY;  Surgeon: [name], Peter M, MD;  Location: [place] [facility] LAB;  Service: Cardiovascular;  Laterality: N/A;    Current Facility-Administered Medications  Medication Dose Route Frequency Provider Last Rate Last Dose  . carvedilol (COREG) tablet 25 mg  25 mg Oral BID Sherran Needs, NP      . empagliflozin (JARDIANCE) tablet 10 mg  10 mg Oral Daily Sherran Needs, NP      . metFORMIN (GLUCOPHAGE) tablet 500 mg  500 mg Oral BID WC Sherran Needs, NP      . potassium chloride SA (K-DUR,KLOR-CON) CR tablet 20 mEq  20 mEq Oral Daily Sherran Needs, NP      . rosuvastatin (CRESTOR) tablet 20 mg  20 mg Oral Daily Sherran Needs, NP      . sacubitril-valsartan (ENTRESTO) 24-26 mg per tablet  1 tablet Oral BID Sherran Needs, NP        Allergies  Allergen Reactions  . Ciprofloxacin Hives and Rash    Social History   Socioeconomic History  . Marital status: Married    Spouse name: Not on file  . Number of children: Not on file  . Years of education: Not on file  . Highest education level: Not  on file  Occupational History  . Not on file  Social Needs  . Financial resource strain: Not on file  . Food insecurity:    Worry: Not on file    Inability: Not on file  . Transportation needs:    Medical: Not on file    Non-medical: Not on file  Tobacco Use  . Smoking status: Former Research scientist (life sciences)  . Smokeless tobacco: Never Used  Substance and Sexual Activity  . Alcohol use: No  . Drug use: No  . Sexual activity: Not on file  Lifestyle  . Physical activity:    Days per week: Not on file    Minutes per session: Not on file  . Stress: Not on file  Relationships  . Social connections:    Talks on phone: Not on file    Gets together: Not on file    Attends religious service: Not on file    Active member of club or organization: Not on file    Attends meetings of  clubs or organizations: Not on file    Relationship status: Not on file  . Intimate partner violence:    Fear of current or ex partner: Not on file    Emotionally abused: Not on file    Physically abused: Not on file    Forced sexual activity: Not on file  Other Topics Concern  . Not on file  Social History Narrative  . Not on file    Family History  Problem Relation Age of Onset  . Arthritis Other   . Hyperlipidemia Other   . Hypertension Other   . Stroke Mother     ROS- All systems are reviewed and negative except as per the HPI above  Physical Exam: There were no vitals filed for this visit. Wt Readings from Last 3 Encounters:  10/18/18 100.2 kg  10/12/18 101.6 kg  10/07/18 102.4 kg    Labs: Lab Results  Component Value Date   NA 138 10/18/2018   K 4.0 10/18/2018   CL 106 10/18/2018   CO2 23 10/18/2018   GLUCOSE 134 (H) 10/18/2018   BUN 15 10/18/2018   CREATININE 1.21 10/18/2018   CALCIUM 9.8 10/18/2018   MG 2.1 10/18/2018   No results found for: INR Lab Results  Component Value Date   CHOL 124 03/08/2018   HDL 42.90 03/08/2018   LDLCALC 44 03/08/2018   TRIG 186.0 (H) 03/08/2018     GEN- The patient is well appearing, alert and oriented x 3 today.   Head- normocephalic, atraumatic Eyes-  Sclera clear, conjunctiva pink Ears- hearing intact Oropharynx- clear Neck- supple, no JVP Lymph- no cervical lymphadenopathy Lungs- Clear to ausculation bilaterally, normal work of breathing Heart-irregular rate and rhythm, no murmurs, rubs or gallops, PMI not laterally displaced GI- soft, NT, ND, + BS Extremities- no clubbing, cyanosis, or edema MS- no significant deformity or atrophy Skin- no rash or lesion Psych- euthymic mood, full affect Neuro- strength and sensation are intact  EKG-afib with rvr at 132 bpm, qrs int 86 ms, qtc 474 ms EchoStudy Conclusions  - Left ventricle: The cavity size was normal. There was mild   concentric hypertrophy. Systolic  function was mildly to   moderately reduced. The estimated ejection fraction was in the   range of 40% to 45%. Diffuse hypokinesis. Features are consistent   with a pseudonormal left ventricular filling pattern, with   concomitant abnormal relaxation and increased filling pressure   (grade 2 diastolic dysfunction). Doppler parameters are  consistent with elevated ventricular end-diastolic filling   pressure. - Mitral valve: There was mild regurgitation. - Left atrium: The atrium was moderately dilated. - Right ventricle: The cavity size was normal. Wall thickness was   normal. Systolic function was normal. - Right atrium: The atrium was normal in size. - Tricuspid valve: There was no regurgitation. - Pericardium, extracardiac: There was no pericardial effusion.     LHC- Ost 1st Diag lesion is 99% stenosed.  Ost Cx to Dist Cx lesion is 25% stenosed.  There is moderate left ventricular systolic dysfunction.  The left ventricular ejection fraction is 35-45% by visual estimate.  LV end diastolic pressure is mildly elevated.  Hemodynamic findings consistent with mild pulmonary hypertension.   1. Left dominant circulation 2. 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 Global LV dysfunction. EF 40%. 4. Mildly elevated LV filling pressures 5. Mild pulmonary venous HTN 6. Low cardiac output.   Assessment and Plan: 1.Persistent symptomatic afib with moderate LV dysfunction Successful cardioversion but ERAF Felt better in SR  LHC showed mild non obstructive CAD other than a very small first diagonal branch, out of proportion to his LVD Probably  LVD is TMC He would like to pursue restoring SR with tikosyn Aware of price of drug Continue eliquis 5 mg bid for CHA2DS2VASc score of  4, states uninterrupted for the last 3 weeks   No benadryl use PharmD screened drugs and is not on any QTC prolonging drugs qtc is SR 450 ms, in afib have seen from 401 to 478  ms( longest today ) but he has RVR at 135 bpm Bmet/mag today with good K+/mag levels for tiksoyn admit, crcl cal at 80.54 ml/min     Butch Penny C. Carroll, [facility] [address] [place], [place] [address] [phone]  I have seen, examined the patient, and reviewed the above assessment and plan.  Changes to above are made where necessary.  On exam, iRRR.  Pt with refractory afib.  Medical options are limited by CAD and ischemic CM.  We will admit for initiation of tikosyn.  Ultimately, catheter ablation may be an option. EKG and labs are reviewed and are appropriate for initiation of tikosyn.  He reports compliance with anticoagulation without interruption.  Co Sign: [name], MD 10/18/2018 3:55 PM

## 2018-10-18 NOTE — Progress Notes (Addendum)
Pharmacy Consult for [place] Electrolyte Replacement  Pharmacy consulted to assist in monitoring and replacing electrolytes in this 68 y.o. male admitted on 3/10 undergoing dofetilide initiation. First dofetilide dose: 3/10@2000   Labs:    Component Value Date/Time   K 4.0 10/18/2018 0938   MG 2.1 10/18/2018 3437     Plan: Potassium: K >/= 4: Appropriate to initiate Tikosyn    Magnesium: Mg >2: Appropriate to initiate Tikosyn    Thank you for allowing pharmacy to participate in this patient's care   Antonietta Jewel, PharmD, [place] Clinical Pharmacist  Pager: [phone] Phone: [phone] 10/18/2018  3:29 PM

## 2018-10-18 NOTE — Progress Notes (Signed)
Pharmacy Review for Dofetilide (Tikosyn) Initiation  Admit Complaint: 68 y.o. male admitted (Not on file) with atrial fibrillation to be initiated on dofetilide.   Assessment:  Patient Exclusion Criteria: If any screening criteria checked as "Yes", then  patient  should NOT receive dofetilide until criteria item is corrected. If "Yes" please indicate correction plan.  YES  NO Patient  Exclusion Criteria Correction Plan  [x]  []  Baseline QTc interval is greater than or equal to 440 msec. IF above YES box checked dofetilide contraindicated unless patient has ICD; then may proceed if QTc 500-550 msec or with known ventricular conduction abnormalities may proceed with QTc 550-600 msec. QTc =   Qtc 474 w/ VR 132 in Afib - MD aware of QTc and okay with continuing with initiation   []  [x]  Magnesium level is less than 1.8 mEq/l : Last magnesium:  Lab Results  Component Value Date   MG 2.1 10/18/2018         []  [x]  Potassium level is less than 4 mEq/l : Last potassium:  Lab Results  Component Value Date   K 4.0 10/18/2018         []  [x]  Patient is known or suspected to have a digoxin level greater than 2 ng/ml: No results found for: DIGOXIN    []  [x]  Creatinine clearance less than 20 ml/min (calculated using Cockcroft-Gault, actual body weight and serum creatinine): Estimated Creatinine Clearance: 70.7 mL/min (by C-G formula based on SCr of 1.21 mg/dL).    []  [x]  Patient has received drugs known to prolong the QT intervals within the last 48 hours (phenothiazines, tricyclics or tetracyclic antidepressants, erythromycin, H-1 antihistamines, cisapride, fluoroquinolones, azithromycin). Drugs not listed above may have an, as yet, undetected potential to prolong the QT interval, updated information on QT prolonging agents is available at this website:QT prolonging agents   []  [x]  Patient received a dose of hydrochlorothiazide (Oretic) alone or in any combination including triamterene (Dyazide,  Maxzide) in the last 48 hours.   []  [x]  Patient received a medication known to increase dofetilide plasma concentrations prior to initial dofetilide dose:  . Trimethoprim (Primsol, Proloprim) in the last 36 hours . Verapamil (Calan, Verelan) in the last 36 hours or a sustained release dose in the last 72 hours . Megestrol (Megace) in the last 5 days  . Cimetidine (Tagamet) in the last 6 hours . Ketoconazole (Nizoral) in the last 24 hours . Itraconazole (Sporanox) in the last 48 hours  . Prochlorperazine (Compazine) in the last 36 hours    []  [x]  Patient is known to have a history of torsades de pointes; congenital or acquired long QT syndromes.   []  [x]  Patient has received a Class 1 antiarrhythmic with less than 2 half-lives since last dose. (Disopyramide, Quinidine, Procainamide, Lidocaine, Mexiletine, Flecainide, Propafenone)   []  [x]  Patient has received amiodarone therapy in the past 3 months or amiodarone level is greater than 0.3 ng/ml.    Patient has been appropriately anticoagulated with Eliquis.  Ordering provider was confirmed at [email or web address] if they are not listed on the [place] Prescribers list.  Goal of Therapy: Follow renal function, electrolytes, potential drug interactions, and dose adjustment. Provide education and 1 week supply at discharge.  Plan:  [x]   Physician selected initial dose within range recommended for patients level of renal function - will monitor for response.  []   Physician selected initial dose outside of range recommended for patients level of renal function - will discuss if the dose should be altered  at this time.   Select One Calculated CrCl  Dose q12h  [x]  > 60 ml/min 500 mcg  []  40-60 ml/min 250 mcg  []  20-40 ml/min 125 mcg   2. Follow up QTc after the first 5 doses, renal function, electrolytes (K & Mg) daily x 3     days, dose adjustment, success of initiation and facilitate 1 week discharge supply as     clinically  indicated.  3. Initiate Tikosyn education video (Call [phone] and ask for Tikosyn Video # 116).  4. Place Enrollment Form on the chart for discharge supply of dofetilide.   Antonietta Jewel, PharmD, [place] Clinical Pharmacist  Pager: [phone] Phone: [phone]  3:30 PM 10/18/2018

## 2018-10-19 LAB — BASIC METABOLIC PANEL
Anion gap: 5 (ref 5–15)
BUN: 15 mg/dL (ref 8–23)
CHLORIDE: 109 mmol/L (ref 98–111)
CO2: 24 mmol/L (ref 22–32)
Calcium: 8.9 mg/dL (ref 8.9–10.3)
Creatinine, Ser: 0.97 mg/dL (ref 0.61–1.24)
GFR calc Af Amer: >60 mL/min (ref >60)
GFR calc non Af Amer: >60 mL/min (ref >60)
Glucose, Bld: 92 mg/dL (ref 70–99)
Potassium: 3.8 mmol/L (ref 3.5–5.1)
Sodium: 138 mmol/L (ref 135–145)

## 2018-10-19 LAB — GLUCOSE, CAPILLARY
Glucose-Capillary: 86 mg/dL (ref 70–99)
Glucose-Capillary: 96 mg/dL (ref 70–99)
Glucose-Capillary: 102 mg/dL — ABNORMAL HIGH (ref 70–99)

## 2018-10-19 LAB — MAGNESIUM: Magnesium: 2 mg/dL (ref 1.7–2.4)

## 2018-10-19 LAB — HIV ANTIBODY (ROUTINE TESTING W REFLEX): HIV Screen 4th Generation wRfx: NONREACTIVE

## 2018-10-19 MED ORDER — CARVEDILOL 25 MG PO TABS
25.0000 mg | ORAL_TABLET | Freq: Two times a day (BID) | ORAL | Status: DC
  Administered 2018-10-19 – 2018-10-21 (×5): 25 mg via ORAL
  Filled 2018-10-19 (×5): qty 1

## 2018-10-19 MED ORDER — POTASSIUM CHLORIDE CRYS ER 20 MEQ PO TBCR
30.0000 meq | EXTENDED_RELEASE_TABLET | Freq: Once | ORAL | Status: AC
  Administered 2018-10-19: 30 meq via ORAL
  Filled 2018-10-19: qty 1

## 2018-10-19 MED ORDER — HYDROCORTISONE 1 % EX CREA
1.0000 "application " | TOPICAL_CREAM | Freq: Three times a day (TID) | CUTANEOUS | Status: DC | PRN
  Filled 2018-10-19: qty 28

## 2018-10-19 MED ORDER — SODIUM CHLORIDE 0.9% FLUSH: 3.0000 mL | INTRAVENOUS | Status: DC | PRN

## 2018-10-19 MED ORDER — SODIUM CHLORIDE 0.9% FLUSH
3.0000 mL | Freq: Two times a day (BID) | INTRAVENOUS | Status: DC
  Administered 2018-10-19 – 2018-10-20 (×3): 3 mL via INTRAVENOUS

## 2018-10-19 MED ORDER — SODIUM CHLORIDE 0.9 % IV SOLN
250.0000 mL | INTRAVENOUS | Status: DC
  Administered 2018-10-19 – 2018-10-20 (×2): 250 mL via INTRAVENOUS

## 2018-10-19 NOTE — Progress Notes (Signed)
Post dose EKG is reviewed AFib 107bpm.  QT in AF is difficulat, manually measured 34ms, QTc 458ms Telemetry reviewed previously with Dr. Rayann Heman, brief/nonsustaine WCT felt to be abberancy OK to continue load  Tommye Standard, PA-C

## 2018-10-19 NOTE — Progress Notes (Addendum)
Progress Note  Patient Name: Christopher Navarro Date of Encounter: 10/19/2018  Primary Cardiologist: Dr. [name]  Subjective   Feels well, no complaints  Inpatient Medications    Scheduled Meds: . apixaban  5 mg Oral BID  . canagliflozin  100 mg Oral QAC breakfast  . carvedilol  25 mg Oral BID  . dofetilide  500 mcg Oral BID  . metFORMIN  500 mg Oral BID WC  . potassium chloride  30 mEq Oral Once  . potassium chloride SA  20 mEq Oral Daily  . rosuvastatin  20 mg Oral Daily  . sacubitril-valsartan  1 tablet Oral BID   Continuous Infusions: . sodium chloride     PRN Meds: sodium chloride, sodium chloride flush   Vital Signs    Vitals:   10/18/18 1606 10/18/18 2048 10/19/18 0647  BP: 123/85 117/83 134/86  Pulse: (!) 53 89 61  Resp:  18 20  Temp: (!) 97.4 F (36.3 C) 97.7 F (36.5 C) (!) 97.5 F (36.4 C)  TempSrc: Axillary Oral Oral  SpO2: 99% 98% 99%  Weight: 100.4 kg  100 kg  Height: 6\' 1"  (1.854 m)      Intake/Output Summary (Last 24 hours) at 10/19/2018 1026 Last data filed at 10/19/2018 0256 Gross per 24 hour  Intake 237 ml  Output 600 ml  Net -363 ml   Last 3 Weights 10/19/2018 10/18/2018 10/18/2018  Weight (lbs) 220 lb 7.4 oz 221 lb 6.4 oz 221 lb  Weight (kg) 100 kg 100.426 kg 100.245 kg      Telemetry    AFib 90's, occassional WCT (monomorphic) 4-7beats, slightly irreg - Personally Reviewed  ECG    AFib, 102bpm, QTc is OK - Personally Reviewed  Physical Exam   GEN: No acute distress.   Neck: No JVD Cardiac: irreg-irreg, no murmurs, rubs, or gallops.  Respiratory: CTA b/l. GI: Soft, nontender, non-distended  MS: No edema; No deformity. Neuro:  Nonfocal  Psych: Normal affect   Labs    Chemistry Recent Labs  Lab 10/12/18 1148 10/18/18 0938 10/19/18 0413  NA 139 138 138  K 3.7 4.0 3.8  CL 105 106 109  CO2 26 23 24   GLUCOSE 115* 134* 92  BUN 12 15 15   CREATININE 0.92 1.21 0.97  CALCIUM 9.4 9.8 8.9  GFRNONAA >60 >60 >60  GFRAA >60  >60 >60  ANIONGAP 8 9 5      HematologyNo results for input(s): WBC, RBC, HGB, HCT, MCV, MCH, MCHC, RDW, PLT in the last 168 hours.  Cardiac EnzymesNo results for input(s): TROPONINI in the last 168 hours. No results for input(s): TROPIPOC in the last 168 hours.   BNPNo results for input(s): BNP, PROBNP in the last 168 hours.   DDimer No results for input(s): DDIMER in the last 168 hours.   Radiology    No results found.  Cardiac Studies   12/16/219: TTE Study Conclusions - Left ventricle: The cavity size was normal. There was mild concentric hypertrophy. Systolic function was mildly to moderately reduced. The estimated ejection fraction was in the range of 40% to 45%. Diffuse hypokinesis. Features are consistent with a pseudonormal left ventricular filling pattern, with concomitant abnormal relaxation and increased filling pressure (grade 2 diastolic dysfunction). Doppler parameters are consistent with elevated ventricular end-diastolic filling pressure. - Mitral valve: There was mild regurgitation. - Left atrium: The atrium was moderately dilated. - Right ventricle: The cavity size was normal. Wall thickness was normal. Systolic function was normal. - Right atrium:  The atrium was normal in size. - Tricuspid valve: There was no regurgitation. - Pericardium, extracardiac: There was no pericardial effusion.    09/15/2018: LHC  Ost 1st Diag lesion is 99% stenosed.  Ost Cx to Dist Cx lesion is 25% stenosed.  There is moderate left ventricular systolic dysfunction.  The left ventricular ejection fraction is 35-45% by visual estimate.  LV end diastolic pressure is mildly elevated.  Hemodynamic findings consistent with mild pulmonary hypertension.  1. Left dominant circulation 2. 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 Global LV dysfunction. EF 40%. 4. Mildly elevated LV filling pressures 5. Mild pulmonary venous  HTN 6. Low cardiac output.   Patient Profile     68 y.o. male w/PMHx of HTN, DM,  Obesity, CAD, CM (felt to be out pf proportion to his CAD, suspect 2/2 AFib/tachycardia), and persistent AFib, admitted for tikosyn intiation  Assessment & Plan    1. 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 CM     Appears compensated currently   3. CAD     No complaints   4. DM     Home meds     For questions or updates, please contact [place] Please consult [email or web address] for contact info under        Signed, Baldwin Jamaica, PA-C  10/19/2018, 10:26 AM     I have seen, examined the patient, and reviewed the above assessment and plan.  Changes to above are made where necessary.  On exam, iRRR.  Remains in afib.  Will plan to proceed with cardioversion tomorrow.   NPO after midnight.  Co Sign: [name], MD 10/19/2018

## 2018-10-19 NOTE — H&P (View-Only) (Signed)
Progress Note  Patient Name: Christopher Navarro Date of Encounter: 10/19/2018  Primary Cardiologist: Dr. [name]  Subjective   Feels well, no complaints  Inpatient Medications    Scheduled Meds: . apixaban  5 mg Oral BID  . canagliflozin  100 mg Oral QAC breakfast  . carvedilol  25 mg Oral BID  . dofetilide  500 mcg Oral BID  . metFORMIN  500 mg Oral BID WC  . potassium chloride  30 mEq Oral Once  . potassium chloride SA  20 mEq Oral Daily  . rosuvastatin  20 mg Oral Daily  . sacubitril-valsartan  1 tablet Oral BID   Continuous Infusions: . sodium chloride     PRN Meds: sodium chloride, sodium chloride flush   Vital Signs    Vitals:   10/18/18 1606 10/18/18 2048 10/19/18 0647  BP: 123/85 117/83 134/86  Pulse: (!) 53 89 61  Resp:  18 20  Temp: (!) 97.4 F (36.3 C) 97.7 F (36.5 C) (!) 97.5 F (36.4 C)  TempSrc: Axillary Oral Oral  SpO2: 99% 98% 99%  Weight: 100.4 kg  100 kg  Height: 6\' 1"  (1.854 m)      Intake/Output Summary (Last 24 hours) at 10/19/2018 1026 Last data filed at 10/19/2018 0256 Gross per 24 hour  Intake 237 ml  Output 600 ml  Net -363 ml   Last 3 Weights 10/19/2018 10/18/2018 10/18/2018  Weight (lbs) 220 lb 7.4 oz 221 lb 6.4 oz 221 lb  Weight (kg) 100 kg 100.426 kg 100.245 kg      Telemetry    AFib 90's, occassional WCT (monomorphic) 4-7beats, slightly irreg - Personally Reviewed  ECG    AFib, 102bpm, QTc is OK - Personally Reviewed  Physical Exam   GEN: No acute distress.   Neck: No JVD Cardiac: irreg-irreg, no murmurs, rubs, or gallops.  Respiratory: CTA b/l. GI: Soft, nontender, non-distended  MS: No edema; No deformity. Neuro:  Nonfocal  Psych: Normal affect   Labs    Chemistry Recent Labs  Lab 10/12/18 1148 10/18/18 0938 10/19/18 0413  NA 139 138 138  K 3.7 4.0 3.8  CL 105 106 109  CO2 26 23 24   GLUCOSE 115* 134* 92  BUN 12 15 15   CREATININE 0.92 1.21 0.97  CALCIUM 9.4 9.8 8.9  GFRNONAA >60 >60 >60  GFRAA >60  >60 >60  ANIONGAP 8 9 5      HematologyNo results for input(s): WBC, RBC, HGB, HCT, MCV, MCH, MCHC, RDW, PLT in the last 168 hours.  Cardiac EnzymesNo results for input(s): TROPONINI in the last 168 hours. No results for input(s): TROPIPOC in the last 168 hours.   BNPNo results for input(s): BNP, PROBNP in the last 168 hours.   DDimer No results for input(s): DDIMER in the last 168 hours.   Radiology    No results found.  Cardiac Studies   12/16/219: TTE Study Conclusions - Left ventricle: The cavity size was normal. There was mild concentric hypertrophy. Systolic function was mildly to moderately reduced. The estimated ejection fraction was in the range of 40% to 45%. Diffuse hypokinesis. Features are consistent with a pseudonormal left ventricular filling pattern, with concomitant abnormal relaxation and increased filling pressure (grade 2 diastolic dysfunction). Doppler parameters are consistent with elevated ventricular end-diastolic filling pressure. - Mitral valve: There was mild regurgitation. - Left atrium: The atrium was moderately dilated. - Right ventricle: The cavity size was normal. Wall thickness was normal. Systolic function was normal. - Right atrium:  The atrium was normal in size. - Tricuspid valve: There was no regurgitation. - Pericardium, extracardiac: There was no pericardial effusion.    09/15/2018: LHC  Ost 1st Diag lesion is 99% stenosed.  Ost Cx to Dist Cx lesion is 25% stenosed.  There is moderate left ventricular systolic dysfunction.  The left ventricular ejection fraction is 35-45% by visual estimate.  LV end diastolic pressure is mildly elevated.  Hemodynamic findings consistent with mild pulmonary hypertension.  1. Left dominant circulation 2. 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 Global LV dysfunction. EF 40%. 4. Mildly elevated LV filling pressures 5. Mild pulmonary venous  HTN 6. Low cardiac output.   Patient Profile     68 y.o. male w/PMHx of HTN, DM,  Obesity, CAD, CM (felt to be out pf proportion to his CAD, suspect 2/2 AFib/tachycardia), and persistent AFib, admitted for tikosyn intiation  Assessment & Plan    1. 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 CM     Appears compensated currently   3. CAD     No complaints   4. DM     Home meds     For questions or updates, please contact [place] Please consult [email or web address] for contact info under        Signed, Baldwin Jamaica, PA-C  10/19/2018, 10:26 AM     I have seen, examined the patient, and reviewed the above assessment and plan.  Changes to above are made where necessary.  On exam, iRRR.  Remains in afib.  Will plan to proceed with cardioversion tomorrow.   NPO after midnight.  Co Sign: [name], MD 10/19/2018

## 2018-10-19 NOTE — TOC Benefit Eligibility Note (Signed)
Transition of Care [facility]) Benefit Eligibility Note    Patient Details  Name: Christopher Navarro MRN: [id number] Date of Birth: 1951/06/22   Medication/Dose: Phyllis Ginger 500 MCG BID  Covered?: Yes  Tier: 3 Drug  Prescription Coverage Preferred Pharmacy: [facility]  Spoke with Person/Company/Phone Number:: TURIYA  @ [employer] # [phone]  Co-Pay: $ 50.00   Prior Approval: No     Additional Notes: DOFETILIDE  500 MCG BID  CO-PAY- $ 7.00     Memory Argue Phone Number: 10/19/2018, 3:52 PM

## 2018-10-19 NOTE — Discharge Instructions (Addendum)
You have an appointment set up with the [facility].  Multiple studies have shown that being followed by a dedicated atrial fibrillation clinic in addition to the standard care you receive from your other physicians improves health. We believe that enrollment in the atrial fibrillation clinic will allow [place] to better care for you.   The phone number to the [facility] is [phone]. The clinic is staffed Monday through Friday from 8:30am to 5pm.  Parking Directions: The clinic is located in the Heart and Vascular Building connected to [facility]. 1)From [address] turn on to [address] and go to the 3rd entrance  (Heart and Vascular entrance) on the right. 2)Look to the right for Heart &Vascular Parking Garage. 3)A code for the entrance is required please call the clinic to receive this.   4)Take the elevators to the 1st floor. Registration is in the room with the glass walls at the end of the hallway.  If you have any trouble parking or locating the clinic, please dont hesitate to call [phone].  Information on my medicine - ELIQUIS (apixaban)   Why was Eliquis prescribed for you? Eliquis was prescribed for you to reduce the risk of a blood clot forming that can cause a stroke if you have a medical condition called atrial fibrillation (a type of irregular heartbeat).  What do You need to know about Eliquis ? Take your Eliquis TWICE DAILY - one tablet in the morning and one tablet in the evening with or without food. If you have difficulty swallowing the tablet whole please discuss with your pharmacist how to take the medication safely.  Take Eliquis exactly as prescribed by your doctor and DO NOT stop taking Eliquis without talking to the doctor who prescribed the medication.  Stopping may increase your risk of developing a stroke.  Refill your prescription before you run out.  After discharge, you should have regular check-up  appointments with your healthcare provider that is prescribing your Eliquis.  In the future your dose may need to be changed if your kidney function or weight changes by a significant amount or as you get older.  What do you do if you miss a dose? If you miss a dose, take it as soon as you remember on the same day and resume taking twice daily.  Do not take more than one dose of ELIQUIS at the same time to make up a missed dose.  Important Safety Information A possible side effect of Eliquis is bleeding. You should call your healthcare provider right away if you experience any of the following: ? Bleeding from an injury or your nose that does not stop. ? Unusual colored urine (red or dark brown) or unusual colored stools (red or black). ? Unusual bruising for unknown reasons. ? A serious fall or if you hit your head (even if there is no bleeding).  Some medicines may interact with Eliquis and might increase your risk of bleeding or clotting while on Eliquis. To help avoid this, consult your healthcare provider or pharmacist prior to using any new prescription or non-prescription medications, including herbals, vitamins, non-steroidal anti-inflammatory drugs (NSAIDs) and supplements.  This website has more information on Eliquis (apixaban): http://www.eliquis.com/eliquis/home

## 2018-10-19 NOTE — Progress Notes (Signed)
Dr Marletta Lor notified patient continuing to have short NS runs of VT 3-8 bt runs, and more frequent PVC's. Pt did receive Coreg 25mg  at 2235 per orders. Per MD continue to monitor, and have am Labs drawn a few hours early. Jessie Foot, RN

## 2018-10-20 ENCOUNTER — Inpatient Hospital Stay (HOSPITAL_COMMUNITY): Payer: Medicare Other | Admitting: Certified Registered"

## 2018-10-20 ENCOUNTER — Encounter (HOSPITAL_COMMUNITY)
Admission: AD | Disposition: A | Discharge status: Home / Self Care | Payer: Self-pay | Source: Home / Self Care | Attending: Internal Medicine

## 2018-10-20 ENCOUNTER — Other Ambulatory Visit: Payer: Self-pay

## 2018-10-20 ENCOUNTER — Encounter (HOSPITAL_COMMUNITY): Payer: Self-pay | Admitting: Cardiology

## 2018-10-20 DIAGNOSIS — I25118 Atherosclerotic heart disease of native coronary artery with other forms of angina pectoris: Secondary | ICD-10-CM

## 2018-10-20 HISTORY — PX: CARDIOVERSION: SHX1299

## 2018-10-20 LAB — BASIC METABOLIC PANEL
Anion gap: 10 (ref 5–15)
BUN: 15 mg/dL (ref 8–23)
CHLORIDE: 108 mmol/L (ref 98–111)
CO2: 21 mmol/L — ABNORMAL LOW (ref 22–32)
Calcium: 8.8 mg/dL — ABNORMAL LOW (ref 8.9–10.3)
Creatinine, Ser: 1.06 mg/dL (ref 0.61–1.24)
GFR calc Af Amer: >60 mL/min (ref >60)
GFR calc non Af Amer: >60 mL/min (ref >60)
Glucose, Bld: 87 mg/dL (ref 70–99)
Potassium: 4 mmol/L (ref 3.5–5.1)
SODIUM: 139 mmol/L (ref 135–145)

## 2018-10-20 LAB — MAGNESIUM: MAGNESIUM: 1.9 mg/dL (ref 1.7–2.4)

## 2018-10-20 LAB — GLUCOSE, CAPILLARY
Glucose-Capillary: 83 mg/dL (ref 70–99)
Glucose-Capillary: 95 mg/dL (ref 70–99)
Glucose-Capillary: 136 mg/dL — ABNORMAL HIGH (ref 70–99)
Glucose-Capillary: 80 mg/dL (ref 70–99)

## 2018-10-20 SURGERY — CARDIOVERSION
Anesthesia: General

## 2018-10-20 MED ORDER — MAGNESIUM SULFATE 2 GM/50ML IV SOLN
2.0000 g | Freq: Once | INTRAVENOUS | Status: AC
  Administered 2018-10-20: 2 g via INTRAVENOUS
  Filled 2018-10-20: qty 50

## 2018-10-20 MED ORDER — PROPOFOL 10 MG/ML IV BOLUS
INTRAVENOUS | Status: DC | PRN
  Administered 2018-10-20: 40 mg via INTRAVENOUS
  Administered 2018-10-20: 60 mg via INTRAVENOUS

## 2018-10-20 MED ORDER — LIDOCAINE 2% (20 MG/ML) 5 ML SYRINGE
INTRAMUSCULAR | Status: DC | PRN
  Administered 2018-10-20: 60 mg via INTRAVENOUS

## 2018-10-20 NOTE — Plan of Care (Signed)
  Problem: Education: Goal: Knowledge of disease or condition will improve Outcome: Progressing   Problem: Activity: Goal: Ability to tolerate increased activity will improve Outcome: Progressing   

## 2018-10-20 NOTE — TOC Initial Note (Signed)
Transition of Care [facility]) - Initial/Assessment Note    Patient Details  Name: Christopher Navarro MRN: [id number] Date of Birth: [birth year]  [facility] CM/SW Contact:    Bethena Roys, RN Phone Number: 10/20/2018, 1:13 PM  Clinical Narrative:  Pt Presented for Tikosyn Load. Pt will need a Rx for 7 day supply no refills sent to [facility].  [facility] uses [facility]. No further needs from CM at this time.              Expected Discharge Plan: Home/Self Care Barriers to Discharge: No Barriers Identified   Patient Goals and CMS Choice     Choice offered to / list presented to : NA  Expected Discharge Plan and Services Expected Discharge Plan: Home/Self Care Discharge Planning Services: CM Consult, Medication Assistance Post Acute Care Choice: NA Living arrangements for the past 2 months: Single Family Home                 DME Arranged: N/A DME Agency: NA HH Arranged: NA [place] Agency: NA  Prior Living Arrangements/Services Living arrangements for the past 2 months: Single Family Home Lives with:: Spouse Patient language and need for interpreter reviewed:: No Do you feel safe going back to the place where you live?: Yes      Need for Family Participation in Patient Care: No (Comment) Care giver support system in place?: No (comment)   Criminal Activity/Legal Involvement Pertinent to Current Situation/Hospitalization: No - Comment as needed  Activities of Daily Living Home Assistive Devices/Equipment: None ADL Screening (condition at time of admission) Patient's cognitive ability adequate to safely complete daily activities?: Yes Is the patient deaf or have difficulty hearing?: No Does the patient have difficulty seeing, even when wearing glasses/contacts?: No Does the patient have difficulty concentrating, remembering, or making decisions?: No Patient able to express need for assistance with ADLs?: Yes Does the patient have difficulty dressing or  bathing?: No Independently performs ADLs?: Yes (appropriate for developmental age) Does the patient have difficulty walking or climbing stairs?: No Weakness of Legs: None Weakness of Arms/Hands: None  Permission Sought/Granted Permission sought to share information with : Case Manager                Emotional Assessment Appearance:: Appears stated age Attitude/Demeanor/Rapport: Engaged Affect (typically observed): Accepting Orientation: : Oriented to Self, Oriented to Place, Oriented to  Time, Oriented to Situation Alcohol / Substance Use: Not Applicable Psych Involvement: No (comment)  Admission diagnosis:  Afib Patient Active Problem List   Diagnosis Date Noted  . Afib ([place]) 10/18/2018  . Chronic systolic CHF (congestive heart failure) ([place]) 09/15/2018  . Persistent atrial fibrillation 07/20/2018  . Obesity (BMI 30-39.9) 08/28/2013  . Diskitis, L5-S1 02/12/2011  . BENIGN NEOPLASM OF SKIN   . Dyslipidemia   . Diabetes mellitus type II   . NEOPLASM OF UNCERTAIN BEHAVIOR OF SKIN 08/13/2010  . Type 2 diabetes mellitus, controlled ([place]) 08/13/2010  . DYSLIPIDEMIA 08/13/2010  . Essential hypertension 06/20/2009  . FASCIITIS, PLANTAR 06/20/2009   PCP:  [name], MD Pharmacy:   [id number] [place] [place] - [address] [place] [address] [place]  [address] Phone: [phone] Fax: [phone]     Social Determinants of Health (SDOH) Interventions    Readmission Risk Interventions 30 Day Unplanned Readmission Risk Score     Admission (Current) from 10/18/2018 in [facility]  30 Day  Unplanned Readmission Risk Score (%)  7 Filed at 10/20/2018 1200     This score is the patient's risk of an unplanned readmission within 30 days of being discharged (0 -100%). The score is based on dignosis, age, lab data, medications, orders, and past utilization.   Low:  0-14.9   Medium: 15-21.9   High:  22-29.9   Extreme: 30 and above       No flowsheet data found.

## 2018-10-20 NOTE — Anesthesia Preprocedure Evaluation (Signed)
Anesthesia Evaluation  Patient identified by MRN, date of birth, ID band Patient awake    Reviewed: Allergy & Precautions, NPO status , Patient's Chart, lab work & pertinent test results  Airway Mallampati: II   Neck ROM: full    Dental   Pulmonary former smoker,    breath sounds clear to auscultation       Cardiovascular hypertension, +CHF  + dysrhythmias Atrial Fibrillation  Rhythm:irregular Rate:Normal     Neuro/Psych    GI/Hepatic   Endo/Other  diabetes, Type 2  Renal/GU      Musculoskeletal  (+) Arthritis ,   Abdominal   Peds  Hematology   Anesthesia Other Findings   Reproductive/Obstetrics                             Anesthesia Physical Anesthesia Plan  ASA: III  Anesthesia Plan: General   Post-op Pain Management:    Induction: Intravenous  PONV Risk Score and Plan: 2 and Propofol infusion and Treatment may vary due to age or medical condition  Airway Management Planned: Mask  Additional Equipment:   Intra-op Plan:   Post-operative Plan:   Informed Consent: I have reviewed the patients History and Physical, chart, labs and discussed the procedure including the risks, benefits and alternatives for the proposed anesthesia with the patient or authorized representative who has indicated his/her understanding and acceptance.       Plan Discussed with: CRNA and Anesthesiologist  Anesthesia Plan Comments:         Anesthesia Quick Evaluation

## 2018-10-20 NOTE — Progress Notes (Signed)
Pharmacy Consult for [place] Electrolyte Replacement  Pharmacy consulted to assist in monitoring and replacing electrolytes in this 68 y.o. male admitted on 10/18/2018 undergoing dofetilide initiation.  Labs:    Component Value Date/Time   K 4.0 10/20/2018 0457   MG 1.9 10/20/2018 0457     Plan: Potassium: K >/= 4: Appropriate to initiate Tikosyn    Magnesium: Mg 1.8-2: Mg 2 gm IV x1 to prevent Mg from dropping below 1.8 - do not need to recheck Mg. Appropriate to initiate Tikosyn   Thank you for allowing pharmacy to participate in this patient's care   Hildred Laser, PharmD Clinical Pharmacist **Pharmacist phone directory can now be found on [email or web address] ([email or web address]).  Listed under [place].

## 2018-10-20 NOTE — CV Procedure (Signed)
Procedure:   DCCV  Indication:  Symptomatic atrial fibrillation  Procedure Note:  The patient signed informed consent.  He has had had therapeutic anticoagulation with apixaban greater than 3 weeks.  Anesthesia was administered by Dr. Marcie Bal.  Adequate airway was maintained throughout and vital followed per protocol.  He was cardioverted x 1 with 120J of biphasic synchronized energy.  He converted to NSR.  There were no apparent complications.  The patient had normal neuro status and respiratory status post procedure with vitals stable as recorded elsewhere.    Follow up:  Return to medical floor  Buford Dresser, MD PhD 10/20/2018 2:10 PM

## 2018-10-20 NOTE — Progress Notes (Addendum)
Progress Note  Patient Name: Christopher Navarro Date of Encounter: 10/20/2018  Primary Cardiologist: Dr. [name]  Subjective   Feels well, no complaints  Inpatient Medications    Scheduled Meds: . apixaban  5 mg Oral BID  . canagliflozin  100 mg Oral QAC breakfast  . carvedilol  25 mg Oral BID  . dofetilide  500 mcg Oral BID  . metFORMIN  500 mg Oral BID WC  . potassium chloride SA  20 mEq Oral Daily  . rosuvastatin  20 mg Oral Daily  . sacubitril-valsartan  1 tablet Oral BID  . sodium chloride flush  3 mL Intravenous Q12H   Continuous Infusions: . sodium chloride    . sodium chloride 250 mL (10/19/18 1203)  . magnesium sulfate 1 - 4 g bolus IVPB     PRN Meds: sodium chloride, hydrocortisone cream, sodium chloride flush, sodium chloride flush   Vital Signs    Vitals:   10/18/18 2048 10/19/18 0647 10/19/18 2130 10/20/18 0650  BP: 117/83 134/86 (!) 121/98 109/75  Pulse: 89 61 (!) 116 (!) 102  Resp: 18 20  20   Temp: 97.7 F (36.5 C) (!) 97.5 F (36.4 C) 98.3 F (36.8 C) 97.8 F (36.6 C)  TempSrc: Oral Oral Oral Oral  SpO2: 98% 99% 95% 100%  Weight:  100 kg  97.5 kg  Height:        Intake/Output Summary (Last 24 hours) at 10/20/2018 1049 Last data filed at 10/19/2018 1500 Gross per 24 hour  Intake 29.33 ml  Output -  Net 29.33 ml   Last 3 Weights 10/20/2018 10/19/2018 10/18/2018  Weight (lbs) 215 lb 220 lb 7.4 oz 221 lb 6.4 oz  Weight (kg) 97.523 kg 100 kg 100.426 kg      Telemetry    AFib 90's, c/w occassional WCT (monomorphic) 4-8beats, slightly irreg - Personally Reviewed  ECG    AFib, 107bpm, QTc looks OK - Personally Reviewed  Physical Exam   Exam is unchanged today GEN: No acute distress.   Neck: No JVD Cardiac: irreg-irreg, no murmurs, rubs, or gallops.  Respiratory: CTA b/l. GI: Soft, nontender, non-distended  MS: No edema; No deformity. Neuro:  Nonfocal  Psych: Normal affect   Labs    Chemistry Recent Labs  Lab 10/18/18 0938  10/19/18 0413 10/20/18 0457  NA 138 138 139  K 4.0 3.8 4.0  CL 106 109 108  CO2 23 24 21*  GLUCOSE 134* 92 87  BUN 15 15 15   CREATININE 1.21 0.97 1.06  CALCIUM 9.8 8.9 8.8*  GFRNONAA >60 >60 >60  GFRAA >60 >60 >60  ANIONGAP 9 5 10      HematologyNo results for input(s): WBC, RBC, HGB, HCT, MCV, MCH, MCHC, RDW, PLT in the last 168 hours.  Cardiac EnzymesNo results for input(s): TROPONINI in the last 168 hours. No results for input(s): TROPIPOC in the last 168 hours.   BNPNo results for input(s): BNP, PROBNP in the last 168 hours.   DDimer No results for input(s): DDIMER in the last 168 hours.   Radiology    No results found.  Cardiac Studies   12/16/219: TTE Study Conclusions - Left ventricle: The cavity size was normal. There was mild concentric hypertrophy. Systolic function was mildly to moderately reduced. The estimated ejection fraction was in the range of 40% to 45%. Diffuse hypokinesis. Features are consistent with a pseudonormal left ventricular filling pattern, with concomitant abnormal relaxation and increased filling pressure (grade 2 diastolic dysfunction). Doppler parameters are  consistent with elevated ventricular end-diastolic filling pressure. - Mitral valve: There was mild regurgitation. - Left atrium: The atrium was moderately dilated. - Right ventricle: The cavity size was normal. Wall thickness was normal. Systolic function was normal. - Right atrium: The atrium was normal in size. - Tricuspid valve: There was no regurgitation. - Pericardium, extracardiac: There was no pericardial effusion.    09/15/2018: LHC  Ost 1st Diag lesion is 99% stenosed.  Ost Cx to Dist Cx lesion is 25% stenosed.  There is moderate left ventricular systolic dysfunction.  The left ventricular ejection fraction is 35-45% by visual estimate.  LV end diastolic pressure is mildly elevated.  Hemodynamic findings consistent with mild pulmonary  hypertension.  1. Left dominant circulation 2. 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 Global LV dysfunction. EF 40%. 4. Mildly elevated LV filling pressures 5. Mild pulmonary venous HTN 6. Low cardiac output.   Patient Profile     68 y.o. male w/PMHx of HTN, DM,  Obesity, CAD, CM (felt to be out pf proportion to his CAD, suspect 2/2 AFib/tachycardia), and persistent AFib, admitted for tikosyn intiation  Assessment & Plan    1. Persistent AFib     CHA2DS2Vasc is 4, on Eliquis     K+ 4.0     Mag 1.9 replacement ordered     Creat 1.06     QT very difficult to establish, looks OK, will continue to load and re-evalyate once in SR    DCCV today Anticipate discharge home tomorrow  2. CM     Appears compensated currently   3. CAD     No complaints   4. DM     Home meds     For questions or updates, please contact [place] Please consult [email or web address] for contact info under        Signed, Baldwin [place], PA-C  10/20/2018, 10:49 AM    I have seen, examined the patient, and reviewed the above assessment and plan.  Changes to above are made where necessary.  On exam, iRRR.  Qt is stable.  Will plan cardioversion today.  No ischemic symptoms.  Following blood sugars on home meds.  Hopefully home tomorrow.  Co Sign: [name], MD 10/20/2018 11:53 AM

## 2018-10-20 NOTE — Anesthesia Procedure Notes (Signed)
Procedure Name: General with mask airway Date/Time: 10/20/2018 2:03 PM Performed by: Imagene Riches, CRNA Pre-anesthesia Checklist: Patient identified, Emergency Drugs available, Suction available and Patient being monitored Patient Re-evaluated:Patient Re-evaluated prior to induction Oxygen Delivery Method: Ambu bag Preoxygenation: Pre-oxygenation with 100% oxygen Induction Type: IV induction

## 2018-10-20 NOTE — Transfer of Care (Signed)
Immediate Anesthesia Transfer of Care Note  Patient: Christopher Navarro  Procedure(s) Performed: CARDIOVERSION (N/A )  Patient Location: Endoscopy Unit  Anesthesia Type:General  Level of Consciousness: drowsy  Airway & Oxygen Therapy: Patient Spontanous Breathing  Post-op Assessment: Report given to RN and Post -op Vital signs reviewed and stable  Post vital signs: Reviewed and stable  Last Vitals:  Vitals Value Taken Time  BP    Temp    Pulse    Resp    SpO2      Last Pain:  Vitals:   10/20/18 1355  TempSrc: Oral  PainSc: 0-No pain         Complications: No apparent anesthesia complications

## 2018-10-20 NOTE — Interval H&P Note (Signed)
History and Physical Interval Note:  10/20/2018 1:53 PM  Christopher Navarro  has presented today for surgery, with the diagnosis of a fib, tikosyn load.  The various methods of treatment have been discussed with the patient and family. After consideration of risks, benefits and other options for treatment, the patient has consented to  Procedure(s): CARDIOVERSION (N/A) as a surgical intervention.  The patient's history has been reviewed, patient examined, no change in status, stable for surgery.  I have reviewed the patient's chart and labs.  Questions were answered to the patient's satisfaction.     Farid Grigorian Christopher Navarro

## 2018-10-21 LAB — BASIC METABOLIC PANEL
Anion gap: 6 (ref 5–15)
BUN: 17 mg/dL (ref 8–23)
CO2: 25 mmol/L (ref 22–32)
Calcium: 8.9 mg/dL (ref 8.9–10.3)
Chloride: 104 mmol/L (ref 98–111)
Creatinine, Ser: 1.1 mg/dL (ref 0.61–1.24)
GFR calc Af Amer: >60 mL/min (ref >60)
Glucose, Bld: 165 mg/dL — ABNORMAL HIGH (ref 70–99)
Potassium: 4.3 mmol/L (ref 3.5–5.1)
Sodium: 135 mmol/L (ref 135–145)

## 2018-10-21 LAB — GLUCOSE, CAPILLARY
GLUCOSE-CAPILLARY: 89 mg/dL (ref 70–99)
Glucose-Capillary: 100 mg/dL — ABNORMAL HIGH (ref 70–99)

## 2018-10-21 LAB — MAGNESIUM: Magnesium: 2.3 mg/dL (ref 1.7–2.4)

## 2018-10-21 MED ORDER — DOFETILIDE 500 MCG PO CAPS: 500.0000 ug | ORAL_CAPSULE | Freq: Two times a day (BID) | ORAL | 1 refills | Status: DC

## 2018-10-21 MED FILL — TIKOSYN 500 MCG CAPS: 500 | 7 days supply | Qty: 14 | Fill #0

## 2018-10-21 NOTE — Anesthesia Postprocedure Evaluation (Signed)
Anesthesia Post Note  Patient: [name]  Procedure(s) Performed: CARDIOVERSION (N/A )     Patient location during evaluation: Endoscopy Anesthesia Type: General Level of consciousness: awake and alert Pain management: pain level controlled Vital Signs Assessment: post-procedure vital signs reviewed and stable Respiratory status: spontaneous breathing, nonlabored ventilation, respiratory function stable and patient connected to nasal cannula oxygen Cardiovascular status: blood pressure returned to baseline and stable Postop Assessment: no apparent nausea or vomiting Anesthetic complications: no    Last Vitals:  Vitals:   10/21/18 0544 10/21/18 0813  BP: (!) 91/55 111/68  Pulse: (!) 57 61  Resp:    Temp: 36.4 C   SpO2: 98%     Last Pain:  Vitals:   10/21/18 0544  TempSrc: Oral  PainSc:                  Toast S

## 2018-10-21 NOTE — Plan of Care (Signed)
  Problem: Activity: Goal: Ability to tolerate increased activity will improve Outcome: Progressing Note:  Ambulates in room and to bathroom independently without difficulty.   Problem: Clinical Measurements: Goal: Will remain free from infection Outcome: Progressing Note:  No s/s of infection noted. Goal: Respiratory complications will improve Outcome: Progressing Note:  No s/s of respiratory complications noted.  Stable on room air.

## 2018-10-21 NOTE — Discharge Summary (Addendum)
ELECTROPHYSIOLOGY PROCEDURE DISCHARGE SUMMARY    Patient ID: Christopher Navarro,  MRN: [id number], DOB/AGE: November 30, 1950 68 y.o.  Admit date: 10/18/2018 Discharge date: 10/21/2018  Primary Care Physician: [name], MD Primary Cardiologist: [name] Electrophysiologist: Navayah Sok  Primary Discharge Diagnosis:  1.  Persistent atrial fibrillation status post Tikosyn loading this admission  Secondary Discharge Diagnosis:  1.  Diabetes 2.  HTN  Allergies  Allergen Reactions  . Ciprofloxacin Hives and Rash     Procedures This Admission:  1.  Tikosyn loading 2.  Direct current cardioversion on 10/20/18 by Dr Harrell Gave which successfully restored SR.  There were no early apparent complications.   Brief HPI: Christopher Navarro is a 68 y.o. male with a past medical history as noted above.  They were referred to EP in the outpatient setting for treatment options of atrial fibrillation.  Risks, benefits, and alternatives to Tikosyn were reviewed with the patient who wished to proceed.    Hospital Course:  The patient was admitted and Tikosyn was initiated.  Renal function and electrolytes were followed during the hospitalization.  Their QTc remained stable.  On 10/20/18 they underwent direct current cardioversion which restored sinus rhythm.  They were monitored until discharge on telemetry which demonstrated SR.  Dr Rayann Heman reviewed final EKG on day of discharge. On the day of discharge, they were examined by Dr Rayann Heman who considered them stable for discharge to home.  Follow-up has been arranged with AF clinic in 1 week and with Dr Rayann Heman in 4 weeks.   Physical Exam: Vitals:   10/20/18 2108 10/21/18 0544 10/21/18 0544 10/21/18 0813  BP: 103/69  (!) 91/55 111/68  Pulse: 64  (!) 57 61  Resp:      Temp: 97.6 F (36.4 C)  97.6 F (36.4 C)   TempSrc: Oral  Oral   SpO2: 98%  98%   Weight:  97.6 kg    Height:        GEN- The patient is well appearing, alert and oriented x 3 today.    HEENT: normocephalic, atraumatic; sclera clear, conjunctiva pink; hearing intact; oropharynx clear; neck supple  Lungs- Clear to ausculation bilaterally, normal work of breathing.  No wheezes, rales, rhonchi Heart- Regular rate and rhythm  GI- soft, non-tender, non-distended, bowel sounds present, no hepatosplenomegaly Extremities- no clubbing, cyanosis, or edema  MS- no significant deformity or atrophy Skin- warm and dry, no rash or lesion Psych- euthymic mood, full affect Neuro- strength and sensation are intact   Labs:   Lab Results  Component Value Date   WBC 7.2 09/07/2018   HGB 15.0 09/15/2018   HCT 44.0 09/15/2018   MCV 80 09/07/2018   PLT 364 09/07/2018    Recent Labs  Lab 10/21/18 0907  NA 135  K 4.3  CL 104  CO2 25  BUN 17  CREATININE 1.10  CALCIUM 8.9  GLUCOSE 165*     Discharge Medications:  Allergies as of 10/21/2018      Reactions   Ciprofloxacin Hives, Rash      Medication List    TAKE these medications   apixaban 5 MG Tabs tablet Commonly known as:  Eliquis Take 1 tablet (5 mg total) by mouth 2 (two) times daily.   carvedilol 25 MG tablet Commonly known as:  Coreg Take 1 tablet (25 mg total) by mouth 2 (two) times daily.   dofetilide 500 MCG capsule Commonly known as:  TIKOSYN Take 1 capsule (500 mcg total) by mouth 2 (  two) times daily.   empagliflozin 10 MG Tabs tablet Commonly known as:  Jardiance Take 10 mg by mouth daily.   metFORMIN 500 MG tablet Commonly known as:  GLUCOPHAGE TAKE 1 TABLET BY MOUTH TWICE A DAY WITH A MEAL **NEED OFFICE VISIT** What changed:  See the new instructions.   multivitamin with minerals Tabs tablet Take 1 tablet by mouth daily.   potassium chloride SA 20 MEQ tablet Commonly known as:  K-DUR,KLOR-CON Take 1 tablet (20 mEq total) by mouth daily.   rosuvastatin 20 MG tablet Commonly known as:  CRESTOR Take 1 tablet (20 mg total) by mouth daily.   sacubitril-valsartan 24-26 MG Commonly known as:   Entresto Take 1 tablet by mouth 2 (two) times daily.       Disposition:  Discharge Instructions    Diet - low sodium heart healthy   Complete by:  As directed    Increase activity slowly   Complete by:  As directed      Follow-up Information    [facility] Follow up.   Specialty:  Cardiology Why:  10/28/2018 @ 10:30AM Contact information: [address] [id number] [place] [address] [phone]       [name], MD Follow up.   Specialty:  Cardiology Why:  11/21/2018 @ 11:15AM Contact information: [place] [address] [phone]           Duration of Discharge Encounter: Greater than 30 minutes including physician time.  Signed, Chanetta Marshall, NP 10/21/2018 12:41 PM   I have seen, examined the patient, and reviewed the above assessment and plan.  Changes to above are made where necessary.  On exam, RRR.  Qt is stable with tikosyn.  Ok to discharge with close outpatient follow-up.  Co Sign: [name], MD 10/21/2018 10:46 PM

## 2018-10-21 NOTE — Progress Notes (Signed)
Pharmacy Consult for [place] Electrolyte Replacement  Pharmacy consulted to assist in monitoring and replacing electrolytes in this 68 y.o. male admitted on 10/18/2018 undergoing dofetilide initiation.   Labs:    Component Value Date/Time   K 4.3 10/21/2018 0907   MG 2.3 10/21/2018 0907     Plan: Potassium: K >/= 4: No additional supplementation needed  Magnesium: Mg >2: No additional supplementation needed   As patient has required at least 20 mEq of potassium replacement every day, recommend discharging patient with prescription for:  Potassium chloride 36mEq  daily (he was on this at home)  Thank you for allowing pharmacy to participate in this patient's care   Hildred Laser, PharmD Clinical Pharmacist **Pharmacist phone directory can now be found on [email or web address] ([email or web address]).  Listed under [place].

## 2018-10-21 NOTE — Care Management Important Message (Signed)
Important Message  Patient Details  Name: ELVAN EBRON MRN: [id number] Date of Birth: 1951/07/30   Medicare Important Message Given:  Yes    Shanyiah Conde P Shelva Hetzer 10/21/2018, 3:01 PM

## 2018-10-22 ENCOUNTER — Encounter (HOSPITAL_COMMUNITY): Payer: Self-pay | Admitting: Cardiology

## 2018-10-25 ENCOUNTER — Ambulatory Visit: Payer: Medicare Other | Admitting: Cardiovascular Disease

## 2018-10-28 ENCOUNTER — Other Ambulatory Visit: Payer: Self-pay

## 2018-10-28 ENCOUNTER — Ambulatory Visit (HOSPITAL_COMMUNITY)
Admission: RE | Admit: 2018-10-28 | Discharge: 2018-10-28 | Disposition: A | Discharge status: Home / Self Care | Payer: Medicare Other | Source: Ambulatory Visit | Attending: Physician Assistant | Admitting: Physician Assistant

## 2018-10-28 ENCOUNTER — Encounter (HOSPITAL_COMMUNITY): Payer: Self-pay | Admitting: Physician Assistant

## 2018-10-28 VITALS — BP 104/64 | HR 51 | Ht 73.0 in | Wt 216.0 lb

## 2018-10-28 DIAGNOSIS — Z881 Allergy status to other antibiotic agents status: Secondary | ICD-10-CM | POA: Insufficient documentation

## 2018-10-28 DIAGNOSIS — E785 Hyperlipidemia, unspecified: Secondary | ICD-10-CM | POA: Insufficient documentation

## 2018-10-28 DIAGNOSIS — Z8349 Family history of other endocrine, nutritional and metabolic diseases: Secondary | ICD-10-CM | POA: Diagnosis not present

## 2018-10-28 DIAGNOSIS — Z79899 Other long term (current) drug therapy: Secondary | ICD-10-CM | POA: Diagnosis not present

## 2018-10-28 DIAGNOSIS — Z87891 Personal history of nicotine dependence: Secondary | ICD-10-CM | POA: Insufficient documentation

## 2018-10-28 DIAGNOSIS — Z7984 Long term (current) use of oral hypoglycemic drugs: Secondary | ICD-10-CM | POA: Insufficient documentation

## 2018-10-28 DIAGNOSIS — Z7901 Long term (current) use of anticoagulants: Secondary | ICD-10-CM | POA: Insufficient documentation

## 2018-10-28 DIAGNOSIS — I110 Hypertensive heart disease with heart failure: Secondary | ICD-10-CM | POA: Diagnosis not present

## 2018-10-28 DIAGNOSIS — I4819 Other persistent atrial fibrillation: Secondary | ICD-10-CM | POA: Insufficient documentation

## 2018-10-28 DIAGNOSIS — Z8249 Family history of ischemic heart disease and other diseases of the circulatory system: Secondary | ICD-10-CM | POA: Insufficient documentation

## 2018-10-28 DIAGNOSIS — E119 Type 2 diabetes mellitus without complications: Secondary | ICD-10-CM | POA: Diagnosis not present

## 2018-10-28 DIAGNOSIS — R001 Bradycardia, unspecified: Secondary | ICD-10-CM | POA: Diagnosis not present

## 2018-10-28 DIAGNOSIS — I5022 Chronic systolic (congestive) heart failure: Secondary | ICD-10-CM | POA: Insufficient documentation

## 2018-10-28 LAB — BASIC METABOLIC PANEL
ANION GAP: 6 (ref 5–15)
BUN: 15 mg/dL (ref 8–23)
CO2: 24 mmol/L (ref 22–32)
Calcium: 9.5 mg/dL (ref 8.9–10.3)
Chloride: 103 mmol/L (ref 98–111)
Creatinine, Ser: 1.01 mg/dL (ref 0.61–1.24)
GFR calc non Af Amer: >60 mL/min (ref >60)
Glucose, Bld: 140 mg/dL — ABNORMAL HIGH (ref 70–99)
Potassium: 4.1 mmol/L (ref 3.5–5.1)
Sodium: 133 mmol/L — ABNORMAL LOW (ref 135–145)

## 2018-10-28 LAB — MAGNESIUM: MAGNESIUM: 2.4 mg/dL (ref 1.7–2.4)

## 2018-10-28 MED ORDER — CARVEDILOL 25 MG PO TABS: 12.5000 mg | ORAL_TABLET | Freq: Two times a day (BID) | ORAL | 11 refills | Status: DC

## 2018-10-28 NOTE — Progress Notes (Signed)
Primary Care Physician: [name], MD Referring Physician: Dr. Gwenlyn Found  Cardiologist: Dr. [name]   Christopher Navarro is a 68 y.o. male with a h/o HTN, DM, that was initially  referred to the afib clinic per Dr. Gwenlyn Found. He was found to be in afib on a return visit to [facility] when  irregular  heart beat was noted.He was started on eliquis 5 mg bid and continued on verapamil. He was then seen by Dr. Gwenlyn Found, echo obtained and he was set up for cardioversion 08/15/17.   Unfortunately, he had ERAF, after successful cardioversion. He was in SR for several days and could tell that he felt so much better. He is interested in pursing AAD. He was seen in the afib clinic and Tikosyn admit was planned. He then decided to get a second cardiology opinion with Dr. [name]. He had a LHC and he had Ost 1st diagonal 99% blocked and Ost Cx to Dist Cx lesion 25% stenosed. His EF was 35-45 %. It was felt that his CAD was out of proportion to his LVD. His meds were maximized for LVD and he is now back in the clinic to continue plans to admit for tikosyn.   F/u afib clinic 10/28/18 s/p Tikosyn loading. Patient reports doing well since he was last seen. He has noticed lower HR at home 40s-50s with BP in the 100s/60s. No syncope or falls.  Today, he denies symptoms of palpitations, chest pain, shortness of breath, orthopnea, PND, lower extremity edema, dizziness, presyncope, syncope, or neurologic sequela. The patient is tolerating medications without difficulties and is otherwise without complaint today.   Past Medical History:  Diagnosis Date  . 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 Diabetes mellitus type II   . Dyslipidemia   . [place], [place] 06/20/2009  . HYPERTENSION 06/20/2009   Past Surgical History:  Procedure Laterality Date  . ablasion  1997  . BACK SURGERY  2006  . CARDIOVERSION N/A 08/15/2018   Procedure: CARDIOVERSION;  Surgeon: Fay Records, MD;  Location: [facility] ENDOSCOPY;   Service: Cardiovascular;  Laterality: N/A;  . CARDIOVERSION N/A 10/20/2018   Procedure: CARDIOVERSION;  Surgeon: Buford Dresser, MD;  Location: [facility] ENDOSCOPY;  Service: Cardiovascular;  Laterality: N/A;  . CARPAL TUNNEL RELEASE  2004   Bilateral  . KNEE ARTHROSCOPY  2002  . RIGHT/LEFT HEART CATH AND CORONARY ANGIOGRAPHY N/A 09/15/2018   Procedure: RIGHT/LEFT HEART CATH AND CORONARY ANGIOGRAPHY;  Surgeon: [name], Peter M, MD;  Location: [facility] CV LAB;  Service: Cardiovascular;  Laterality: N/A;    Current Outpatient Medications  Medication Sig Dispense Refill  . apixaban (ELIQUIS) 5 MG TABS tablet Take 1 tablet (5 mg total) by mouth 2 (two) times daily.    . carvedilol (COREG) 25 MG tablet Take 1 tablet (25 mg total) by mouth 2 (two) times daily. 60 tablet 11  . dofetilide (TIKOSYN) 500 MCG capsule Take 1 capsule (500 mcg total) by mouth 2 (two) times daily. 180 capsule 1  . empagliflozin (JARDIANCE) 10 MG TABS tablet Take 10 mg by mouth daily. 90 tablet 3  . metFORMIN (GLUCOPHAGE) 500 MG tablet TAKE 1 TABLET BY MOUTH TWICE A DAY WITH A MEAL **NEED OFFICE VISIT** (Patient taking differently: Take 500 mg by mouth 2 (two) times daily with a meal. ) 60 tablet 0  . Multiple Vitamin (MULTIVITAMIN WITH MINERALS) TABS tablet Take 1 tablet by mouth daily.    . potassium chloride SA (  K-DUR,KLOR-CON) 20 MEQ tablet Take 1 tablet (20 mEq total) by mouth daily. 90 tablet 1  . rosuvastatin (CRESTOR) 20 MG tablet Take 1 tablet (20 mg total) by mouth daily. 90 tablet 3  . sacubitril-valsartan (ENTRESTO) 24-26 MG Take 1 tablet by mouth 2 (two) times daily. 180 tablet 3   No current facility-administered medications for this encounter.     Allergies  Allergen Reactions  . Ciprofloxacin Hives and Rash    Social History   Socioeconomic History  . Marital status: Married    Spouse name: Not on file  . Number of children: Not on file  . Years of education: Not on file  . Highest education  level: Not on file  Occupational History  . Not on file  Social Needs  . Financial resource strain: Not on file  . Food insecurity:    Worry: Not on file    Inability: Not on file  . Transportation needs:    Medical: Not on file    Non-medical: Not on file  Tobacco Use  . Smoking status: Former Research scientist (life sciences)  . Smokeless tobacco: Never Used  Substance and Sexual Activity  . Alcohol use: No  . Drug use: No  . Sexual activity: Not on file  Lifestyle  . Physical activity:    Days per week: Not on file    Minutes per session: Not on file  . Stress: Not on file  Relationships  . Social connections:    Talks on phone: Not on file    Gets together: Not on file    Attends religious service: Not on file    Active member of club or organization: Not on file    Attends meetings of clubs or organizations: Not on file    Relationship status: Not on file  . Intimate partner violence:    Fear of current or ex partner: Not on file    Emotionally abused: Not on file    Physically abused: Not on file    Forced sexual activity: Not on file  Other Topics Concern  . Not on file  Social History Narrative  . Not on file    Family History  Problem Relation Age of Onset  . Arthritis Other   . Hyperlipidemia Other   . Hypertension Other   . Stroke Mother     ROS- All systems are reviewed and negative except as per the HPI above  Physical Exam: Vitals:   10/28/18 1026  BP: 104/64  Pulse: (!) 51  Weight: 98 kg  Height: 6\' 1"  (1.854 m)   Wt Readings from Last 3 Encounters:  10/28/18 98 kg  10/21/18 97.6 kg  10/18/18 100.2 kg    Labs: Lab Results  Component Value Date   NA 135 10/21/2018   K 4.3 10/21/2018   CL 104 10/21/2018   CO2 25 10/21/2018   GLUCOSE 165 (H) 10/21/2018   BUN 17 10/21/2018   CREATININE 1.10 10/21/2018   CALCIUM 8.9 10/21/2018   MG 2.3 10/21/2018   No results found for: INR Lab Results  Component Value Date   CHOL 124 03/08/2018   HDL 42.90 03/08/2018    LDLCALC 44 03/08/2018   TRIG 186.0 (H) 03/08/2018    GEN- The patient is well appearing, alert and oriented x 3 today.   HEENT-head normocephalic, atraumatic, sclera clear, conjunctiva pink, hearing intact, trachea midline. Lungs- Clear to ausculation bilaterally, normal work of breathing Heart- Regular rate and rhythm, no murmurs, rubs or gallops  GI- soft, NT, ND, + BS Extremities- no clubbing, cyanosis, or edema MS- no significant deformity or atrophy Skin- no rash or lesion Psych- euthymic mood, full affect Neuro- strength and sensation are intact   EKG- Sinus bradycardia HR 51, PR 144, QRS 90, QTc 462  EchoStudy Conclusions  - Left ventricle: The cavity size was normal. There was mild   concentric hypertrophy. Systolic function was mildly to   moderately reduced. The estimated ejection fraction was in the   range of 40% to 45%. Diffuse hypokinesis. Features are consistent   with a pseudonormal left ventricular filling pattern, with   concomitant abnormal relaxation and increased filling pressure   (grade 2 diastolic dysfunction). Doppler parameters are   consistent with elevated ventricular end-diastolic filling   pressure. - Mitral valve: There was mild regurgitation. - Left atrium: The atrium was moderately dilated. - Right ventricle: The cavity size was normal. Wall thickness was   normal. Systolic function was normal. - Right atrium: The atrium was normal in size. - Tricuspid valve: There was no regurgitation. - Pericardium, extracardiac: There was no pericardial effusion.    LHC- Ost 1st Diag lesion is 99% stenosed.  Ost Cx to Dist Cx lesion is 25% stenosed.  There is moderate left ventricular systolic dysfunction.  The left ventricular ejection fraction is 35-45% by visual estimate.  LV end diastolic pressure is mildly elevated.  Hemodynamic findings consistent with mild pulmonary hypertension.   1. Left dominant circulation 2. Nonobstructive CAD  except for a very small first diagonal branch 3. Global LV dysfunction. EF 40%. 4. Mildly elevated LV filling pressures 5. Mild pulmonary venous HTN 6. Low cardiac output.   Assessment and Plan:  1.Persistent symptomatic afib with moderate LV dysfunction Appears to be maintaining SR. Does report bradycardia with some hypotension at home. Continue dofetilide 500 mcg BID. QT stable Decrease Coreg to 12.5 mg BID Continue eliquis 5 mg bid  Check Bmet/mag   This patients CHA2DS2-VASc Score and unadjusted Ischemic Stroke Rate (% per year) is equal to 4.8 % stroke rate/year from a score of 4  Above score calculated as 1 point each if present [CHF, HTN, DM, Vascular=MI/PAD/Aortic Plaque, Age if 65-74, or Male] Above score calculated as 2 points each if present [Age > 75, or Stroke/TIA/TE]   2. Chronic systolic CHF Likely tachycardia mediated given LHC results. No signs of fluid overload. Med changes as above. Would repeat echo in 2-3 months now that SR is restored.  3. HTN Stable, med changes as above.   Follow up with Dr Rayann Heman as scheduled. Patient is on MyChart and agreeable to Televisits.   [facility] [address] [place], [place] [address] [phone]

## 2018-10-28 NOTE — Patient Instructions (Signed)
Decrease coreg to 12.5mg  twice  A day

## 2018-11-01 ENCOUNTER — Telehealth: Payer: Self-pay | Admitting: Cardiology

## 2018-11-01 NOTE — Telephone Encounter (Signed)
  Patient has appt with Allred on 11/21/18 for Tikosyn f/u but he says that Dr [name] wanted to see him too. Patient wants to know if he needs to make an appt with [name] also. Please advise.

## 2018-11-02 NOTE — Telephone Encounter (Signed)
Returned call to patient Dr.Jordan advised he does need to see you.He advised to see you via my chart.Advised I will call you back next week and let you know when to expect the video visit.

## 2018-11-08 ENCOUNTER — Other Ambulatory Visit: Payer: Self-pay

## 2018-11-08 ENCOUNTER — Telehealth: Payer: Self-pay

## 2018-11-08 NOTE — Telephone Encounter (Signed)
Virtual Visit Pre-Appointment Phone Call  Steps For Call:  1. Confirm consent - "In the setting of the current Covid19 crisis, you are scheduled for a (phone or video) visit with your provider on (date) at (time).  Just as we do with many in-office visits, in order for you to participate in this visit, we must obtain consent.  If you'd like, I can send this to your mychart (if signed up) or email for you to review.  Otherwise, I can obtain your verbal consent now.  All virtual visits are billed to your insurance company just like a normal visit would be.  By agreeing to a virtual visit, we'd like you to understand that the technology does not allow for your provider to perform an examination, and thus may limit your provider's ability to fully assess your condition.  Finally, though the technology is pretty good, we cannot assure that it will always work on either your or our end, and in the setting of a video visit, we may have to convert it to a phone-only visit.  In either situation, we cannot ensure that we have a secure connection.  Are you willing to proceed?"  2. Give patient instructions for WebEx download to smartphone as below if video visit  3. Advise patient to be prepared with any vital sign or heart rhythm information, their current medicines, and a piece of paper and pen handy for any instructions they may receive the day of their visit  4. Inform patient they will receive a phone call 15 minutes prior to their appointment time (may be from unknown caller ID) so they should be prepared to answer  5. Confirm that appointment type is correct in Epic appointment notes (video vs telephone)    TELEPHONE CALL NOTE  Christopher Navarro has been deemed a candidate for a follow-up tele-health visit to limit community exposure during the Covid-19 pandemic. I spoke with the patient via phone to ensure availability of phone/video source, confirm preferred email & phone number, and discuss  instructions and expectations.  I reminded Christopher Navarro to be prepared with any vital sign and/or heart rhythm information that could potentially be obtained via home monitoring, at the time of his visit. I reminded Christopher Navarro to expect a phone call at the time of his visit if his visit.  Did the patient verbally acknowledge consent to treatment? Yes  Jacqulynn Cadet, [facility] 11/08/2018 12:26 PM   DOWNLOADING THE [place], go to [employer] and type in WebEx in the search bar. [employer], the blue/green circle. The app is free but as with any other app downloads, their phone may require them to verify saved payment information or Apple password. The patient does NOT have to create an account.  - If Android, ask patient to go to [employer] and type in WebEx in the search bar. [place] [employer], the blue/green circle. The app is free but as with any other app downloads, their phone may require them to verify saved payment information or Android password. The patient does NOT have to create an account.   CONSENT FOR TELE-HEALTH VISIT - PLEASE REVIEW  I hereby voluntarily request, consent and authorize [facility] and its employed or contracted physicians, physician assistants, nurse practitioners or other licensed health care professionals (the Practitioner), to provide me with telemedicine health care services (the "Services") as deemed necessary by the treating Practitioner. I  acknowledge and consent to receive the Services by the Practitioner via telemedicine. I understand that the telemedicine visit will involve communicating with the Practitioner through live audiovisual communication technology and the disclosure of certain medical information by electronic transmission. I acknowledge that I have been given the opportunity to request an in-person assessment or other available alternative prior to the telemedicine visit and am  voluntarily participating in the telemedicine visit.  I understand that I have the right to withhold or withdraw my consent to the use of telemedicine in the course of my care at any time, without affecting my right to future care or treatment, and that the Practitioner or I may terminate the telemedicine visit at any time. I understand that I have the right to inspect all information obtained and/or recorded in the course of the telemedicine visit and may receive copies of available information for a reasonable fee.  I understand that some of the potential risks of receiving the Services via telemedicine include:  Marland Kitchen Delay or interruption in medical evaluation due to technological equipment failure or disruption; . Information transmitted may not be sufficient (e.g. poor resolution of images) to allow for appropriate medical decision making by the Practitioner; and/or  . In rare instances, security protocols could fail, causing a breach of personal health information.  Furthermore, I acknowledge that it is my responsibility to provide information about my medical history, conditions and care that is complete and accurate to the best of my ability. I acknowledge that Practitioner's advice, recommendations, and/or decision may be based on factors not within their control, such as incomplete or inaccurate data provided by me or distortions of diagnostic images or specimens that may result from electronic transmissions. I understand that the practice of medicine is not an exact science and that Practitioner makes no warranties or guarantees regarding treatment outcomes. I acknowledge that I will receive a copy of this consent concurrently upon execution via email to the email address I last provided but may also request a printed copy by calling the office of [place].    I understand that my insurance will be billed for this visit.   I have read or had this consent read to me. . I understand the  contents of this consent, which adequately explains the benefits and risks of the Services being provided via telemedicine.  . I have been provided ample opportunity to ask questions regarding this consent and the Services and have had my questions answered to my satisfaction. . I give my informed consent for the services to be provided through the use of telemedicine in my medical care  By participating in this telemedicine visit I agree to the above.

## 2018-11-08 NOTE — Telephone Encounter (Signed)
Spoke to patient he stated he just got a call from our office, he was driving it was hard to hear.Advised our CMA Ellison Carwin was calling to ask a few questions and do a practice video before tomorrow's appointment with Dr.Jordan.Advised I will let her know you are home and she can call you back.

## 2018-11-08 NOTE — Telephone Encounter (Signed)
Follow Up:; ° ° °Returning your call. °

## 2018-11-09 ENCOUNTER — Encounter: Payer: Self-pay | Admitting: Cardiology

## 2018-11-09 ENCOUNTER — Telehealth: Payer: Medicare Other | Admitting: Cardiology

## 2018-11-09 ENCOUNTER — Telehealth (INDEPENDENT_AMBULATORY_CARE_PROVIDER_SITE_OTHER): Payer: Medicare Other | Admitting: Cardiology

## 2018-11-09 ENCOUNTER — Other Ambulatory Visit: Payer: Self-pay

## 2018-11-09 VITALS — BP 130/70 | HR 54 | Resp 16 | Wt 211.5 lb

## 2018-11-09 DIAGNOSIS — I420 Dilated cardiomyopathy: Secondary | ICD-10-CM

## 2018-11-09 DIAGNOSIS — I5022 Chronic systolic (congestive) heart failure: Secondary | ICD-10-CM | POA: Diagnosis not present

## 2018-11-09 DIAGNOSIS — E785 Hyperlipidemia, unspecified: Secondary | ICD-10-CM

## 2018-11-09 DIAGNOSIS — I110 Hypertensive heart disease with heart failure: Secondary | ICD-10-CM | POA: Diagnosis not present

## 2018-11-09 DIAGNOSIS — I4819 Other persistent atrial fibrillation: Secondary | ICD-10-CM | POA: Diagnosis not present

## 2018-11-09 DIAGNOSIS — I10 Essential (primary) hypertension: Secondary | ICD-10-CM

## 2018-11-09 NOTE — Progress Notes (Signed)
Virtual Visit via Video Note    Evaluation Performed:  Follow-up visit  This visit type was conducted due to national recommendations for restrictions regarding the COVID-19 Pandemic (e.g. social distancing).  This format is felt to be most appropriate for this patient at this time.  All issues noted in this document were discussed and addressed.  No physical exam was performed (except for noted visual exam findings with Video Visits).  Please refer to the patient's chart (MyChart message for video visits and phone note for telephone visits) for the patient's consent to telehealth for [facility].  Date:  11/09/2018   ID:  Christopher Navarro, DOB 1950/11/07, MRN [id number]  Patient Location:  Home  Provider location:   office  PCP:  [name], MD  Cardiologist:  Malichi Palardy [name] MD Electrophysiologist:  [name] MD  Chief Complaint: CHF  History of Present Illness:    Christopher Navarro is a 68 y.o. male who presents via audio/video conferencing for a telehealth visit today.  He  is a 67 y.o. male who presents for follow up  of Afib and CHF.  He has a history of WPW and underwent an ablation in 1997 by Dr. Caryl Comes. He was seen by Dr. Gwenlyn Found on Jul 20, 2018 for evaluation of new onset AFib. He states that prior to Thanksgiving he noted increase fatigue. As time went on he developed more fatigue, SOB, and some chest discomfort.  He was anticoagulated. Echo showed global HK with EF 40-45%. He has been on Verapamil for rate control. He was seen in the Afib clinic and after anticoagulation he underwent DCCV on 08/15/18. He unfortunately had early return of Afib.  He has a history of NIDDM, HLD, and HTN. He also has a family history of CAD with a brother having CABG.  He was seen back in Afib clinic and options for AAD therapy reviewed. Given LV dysfunction options include Tikosyn or amiodarone. Patient's family was concerned about this approach and thought he needed further evaluation of his LV  dysfunction. He was seen by me for a second opinion. On his initial visit we switched his Verapamil to Coreg due to negative inotropic effects of Verapamil. His lisinopril dose was increased. He subsequently underwent [facility] on 09/15/18. 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 Mildly elevated LV filling pressures and pulmonary HTN. Low cardiac output. His Coreg dose was increased. He was switched from lisinopril to [facility] and was also started on Jardiance. Since his visit with me in February he was seen by the Afib clinic and admitted for Tikosyn loading and he had DCCV on 10/20/18. QT remained normal.   On visit today he notes that since his cardioversion he has felt much better. No SOB. Energy level is much better. No chest pain, dizziness, edema. BS this am 97. Doing some walking but hasn't really pushed it. BP has improved.   The patient does not have symptoms concerning for COVID-19 infection (fever, chills, cough, or new shortness of breath).    Prior CV studies:   The following studies were reviewed today:  Echo 07/25/18: Study Conclusions  - Left ventricle: The cavity size was normal. There was mild concentric hypertrophy. Systolic function was mildly to moderately reduced. The estimated ejection fraction was in the range of 40% to 45%. Diffuse hypokinesis. Features are consistent with a pseudonormal left ventricular filling pattern, with concomitant abnormal relaxation and increased filling pressure (grade 2 diastolic dysfunction). Doppler parameters are consistent with elevated ventricular end-diastolic  filling pressure. - Mitral valve: There was mild regurgitation. - Left atrium: The atrium was moderately dilated. - Right ventricle: The cavity size was normal. Wall thickness was normal. Systolic function was normal. - Right atrium: The atrium was normal in size. - Tricuspid valve: There was no regurgitation. - Pericardium, extracardiac: There was no  pericardial effusion.  Impressions:  - No prior study available for comparison.  Cardiac cath 09/25/18:  RIGHT/LEFT HEART CATH AND CORONARY ANGIOGRAPHY  Conclusion     Ost 1st Diag lesion is 99% stenosed.  Ost Cx to Dist Cx lesion is 25% stenosed.  There is moderate left ventricular systolic dysfunction.  The left ventricular ejection fraction is 35-45% by visual estimate.  LV end diastolic pressure is mildly elevated.  Hemodynamic findings consistent with mild pulmonary hypertension.  1. Left dominant circulation 2. 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 Global LV dysfunction. EF 40%. 4. Mildly elevated LV filling pressures 5. Mild pulmonary venous HTN 6. Low cardiac output.   Plan: will optimize therapy for CHF. Increase Coreg dose to 25 mg bid as Afib rate is not well controlled. Consider switching lisinopril to Entresto. Consider adding Jardiance for DM. Once CHF medications optimized will plan Tikosyn load and conversion to NSR. OK to resume Eliquis in am.       Past Medical History:  Diagnosis Date  . 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 Diabetes mellitus type II   . Dyslipidemia   . [place], [place] 06/20/2009  . HYPERTENSION 06/20/2009   Past Surgical History:  Procedure Laterality Date  . ablasion  1997  . BACK SURGERY  2006  . CARDIOVERSION N/A 08/15/2018   Procedure: CARDIOVERSION;  Surgeon: Fay Records, MD;  Location: [facility] ENDOSCOPY;  Service: Cardiovascular;  Laterality: N/A;  . CARDIOVERSION N/A 10/20/2018   Procedure: CARDIOVERSION;  Surgeon: Buford Dresser, MD;  Location: [facility] ENDOSCOPY;  Service: Cardiovascular;  Laterality: N/A;  . CARPAL TUNNEL RELEASE  2004   Bilateral  . KNEE ARTHROSCOPY  2002  . RIGHT/LEFT HEART CATH AND CORONARY ANGIOGRAPHY N/A 09/15/2018   Procedure: RIGHT/LEFT HEART CATH AND CORONARY ANGIOGRAPHY;  Surgeon: [name], Verneice Caspers M, MD;  Location: [place] CV LAB;  Service: Cardiovascular;  Laterality: N/A;     No outpatient medications have been marked as taking for the 11/09/18 encounter (Telemedicine) with [name], Karime Scheuermann M, MD.     Allergies:   Ciprofloxacin   Social History   Tobacco Use  . Smoking status: Former Research scientist (life sciences)  . Smokeless tobacco: Never Used  Substance Use Topics  . Alcohol use: No  . Drug use: No     Family Hx: The patient's family history includes Arthritis in an other family member; Hyperlipidemia in an other family member; Hypertension in an other family member; Stroke in his mother.  ROS:   Please see the history of present illness.     All other systems reviewed and are negative.   Labs/Other Tests and Data Reviewed:    Recent Labs: 07/05/2018: TSH 1.17 09/07/2018: Platelets 364 09/15/2018: Hemoglobin 15.0 09/27/2018: ALT 23 10/28/2018: BUN 15; Creatinine, Ser 1.01; Magnesium 2.4; Potassium 4.1; Sodium 133   Recent Lipid Panel Lab Results  Component Value Date/Time   CHOL 124 03/08/2018 11:12 AM   TRIG 186.0 (H) 03/08/2018 11:12 AM   HDL 42.90 03/08/2018 11:12 AM   CHOLHDL 3 03/08/2018 11:12 AM   LDLCALC 44 03/08/2018 11:12 AM    Wt  Readings from Last 3 Encounters:  11/09/18 211 lb 8 oz (95.9 kg)  10/28/18 216 lb (98 kg)  10/21/18 215 lb 1.6 oz (97.6 kg)     Objective:    Vital Signs:  BP 130/70   Pulse (!) 54   Resp 16   Wt 211 lb 8 oz (95.9 kg)   SpO2 97%   BMI 27.90 kg/m  per patient report  Well nourished, well developed male in no acute distress. 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   ASSESSMENT & PLAN:    1.  Chronic systolic CHF with nonischemic cardiomyopathy. EF 40%.  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 Cardiac cath results as noted. No on Coreg and Entresto. He is asymptomatic now - much improved since DCCV. Weight is stable. I would like to repeat Echo about 2 months from now. Will continue current therapy and revisit with patient in 6 weeks.   2. Atrial fibrillation persistent and symptomatic. S/p DCCV with early return of AFib. On Eliquis. Now loaded with Tikosyn  and repeat DCCV performed on 10/20/18.  Maintaining NSR. If Tikosyn fails would need consideration for ablation. 3. Remote history of WPW s/p ablation 4. HTN controlled 5. Hypercholesterolemia. Now on Crestor. Will need repeat lipid panel with next lab draw.  6. DM type 2 on metformin. Recent addition of Jardiance with CHF. Sugars improved. Weight down.   COVID-19 Education: The signs and symptoms of COVID-19 were discussed with the patient and how to seek care for testing (follow up with PCP or arrange E-visit).  The importance of social distancing was discussed today.  Patient Risk:   After full review of this patient's clinical status, I feel that they are at least moderate risk at this time.  Time:   Today, I have spent 20 minutes with the patient with telehealth technology discussing CHF management    Medication Adjustments/Labs and Tests Ordered: Current medicines are reviewed at length with the patient today.  Concerns regarding medicines are outlined above.  Tests Ordered: No orders of the defined types were placed in this encounter.  Medication Changes: No orders of the defined types were placed in this encounter.   Disposition:  Follow up in 6 week(s)  Signed, Shavon Ashmore [name], MD  11/09/2018 9:16 AM    [facility]

## 2018-11-09 NOTE — Patient Instructions (Signed)
Continue current medical therapy.   We will follow up in 6 weeks with lab work.

## 2018-11-14 ENCOUNTER — Telehealth: Payer: Self-pay | Admitting: Internal Medicine

## 2018-11-14 ENCOUNTER — Other Ambulatory Visit: Payer: Self-pay | Admitting: Family Medicine

## 2018-11-14 NOTE — Telephone Encounter (Signed)
Patient returning call.

## 2018-11-21 ENCOUNTER — Ambulatory Visit: Payer: Medicare Other | Admitting: Internal Medicine

## 2018-11-22 ENCOUNTER — Telehealth: Payer: Self-pay | Admitting: Family Medicine

## 2018-11-22 ENCOUNTER — Other Ambulatory Visit: Payer: Self-pay

## 2018-11-22 ENCOUNTER — Ambulatory Visit (INDEPENDENT_AMBULATORY_CARE_PROVIDER_SITE_OTHER): Payer: Medicare Other | Admitting: Family Medicine

## 2018-11-22 DIAGNOSIS — E1165 Type 2 diabetes mellitus with hyperglycemia: Secondary | ICD-10-CM | POA: Diagnosis not present

## 2018-11-22 MED ORDER — METFORMIN HCL 500 MG PO TABS: ORAL_TABLET | ORAL | 3 refills | Status: DC

## 2018-11-22 NOTE — Telephone Encounter (Signed)
Patient has an appointment with Dr. Elease Hashimoto today at [place].

## 2018-11-22 NOTE — Telephone Encounter (Signed)
Copied from [place] [id number]. Topic: Quick Communication - See Telephone Encounter >> Nov 22, 2018  9:27 AM Loma [place] wrote: CRM for notification. See Telephone encounter for: 11/22/18.metFORMIN (GLUCOPHAGE) 500 MG tablet Med was sent by pharmacy/ denied, Pt open to a tele- appt or maybe even virtual for purpose of refill [id number] - [place], [place] - [address] [phone] (Phone) [phone] (Fax) P;ease contact pt at [phone] (M)

## 2018-11-22 NOTE — Progress Notes (Signed)
Virtual Visit via Video Note  I connected with Alvy Beal on 11/22/18 at  4:00 PM EDT by a video enabled telemedicine application and verified that I am speaking with the correct person using two identifiers.  Location patient: home Location provider:work or home office Persons participating in the virtual visit: patient, provider  I discussed the limitations of evaluation and management by telemedicine and the availability of in person appointments. The patient expressed understanding and agreed to proceed.   HPI: Patient has chronic problems including hypertension, systolic heart failure, atrial fibrillation, type 2 diabetes, dyslipidemia.  We diagnosed his atrial fibrillation several months ago.  He is currently taking Tikosyn as well as carvedilol and Eliquis.  He is doing well symptomatically.  He states he has been in sinus rhythm.  No recent chest pains.  Type 2 diabetes.  Patient states he has lost 20 pounds due to his efforts mostly through dietary change since he was here last.  Last A1c 7.0%.  Needs refills of metformin.  Also taking Jardiance 10 mg daily.  No polyuria or polydipsia.  Blood pressures been well controlled.  Blood sugar today 2 hours after lunch 120   ROS: See pertinent positives and negatives per HPI.  Past Medical History:  Diagnosis Date  . 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 Diabetes mellitus type II   . Dyslipidemia   . [place], [place] 06/20/2009  . HYPERTENSION 06/20/2009    Past Surgical History:  Procedure Laterality Date  . ablasion  1997  . BACK SURGERY  2006  . CARDIOVERSION N/A 08/15/2018   Procedure: CARDIOVERSION;  Surgeon: Fay Records, MD;  Location: [facility] ENDOSCOPY;  Service: Cardiovascular;  Laterality: N/A;  . CARDIOVERSION N/A 10/20/2018   Procedure: CARDIOVERSION;  Surgeon: Buford Dresser, MD;  Location: [facility] ENDOSCOPY;  Service: Cardiovascular;  Laterality: N/A;  . CARPAL TUNNEL RELEASE  2004   Bilateral  . KNEE ARTHROSCOPY  2002  . RIGHT/LEFT HEART CATH AND CORONARY ANGIOGRAPHY N/A 09/15/2018   Procedure: RIGHT/LEFT HEART CATH AND CORONARY ANGIOGRAPHY;  Surgeon: [name], Peter M, MD;  Location: [place] CV LAB;  Service: Cardiovascular;  Laterality: N/A;    Family History  Problem Relation Age of Onset  . Arthritis Other   . Hyperlipidemia Other   . Hypertension Other   . Stroke Mother     SOCIAL HX: Former smoker.  Retired from Nurse, learning disability   Current Outpatient Medications:  .  apixaban (ELIQUIS) 5 MG TABS tablet, Take 1 tablet (5 mg total) by mouth 2 (two) times daily., Disp: , Rfl:  .  carvedilol (COREG) 25 MG tablet, Take 0.5 tablets (12.5 mg total) by mouth 2 (two) times daily., Disp: 60 tablet, Rfl: 11 .  dofetilide (TIKOSYN) 500 MCG capsule, Take 1 capsule (500 mcg total) by mouth 2 (two) times daily., Disp: 180 capsule, Rfl: 1 .  empagliflozin (JARDIANCE) 10 MG TABS tablet, Take 10 mg by mouth daily., Disp: 90 tablet, Rfl: 3 .  metFORMIN (GLUCOPHAGE) 500 MG tablet, TAKE 1 TABLET BY MOUTH TWICE A DAY WITH A MEAL, Disp: 180 tablet, Rfl: 3 .  Multiple Vitamin (MULTIVITAMIN WITH MINERALS) TABS tablet, Take 1 tablet by mouth daily., Disp: , Rfl:  .  potassium chloride SA (K-DUR,KLOR-CON) 20 MEQ tablet, Take 1 tablet (20 mEq total) by mouth daily., Disp: 90 tablet, Rfl: 1 .  rosuvastatin (CRESTOR) 20 MG tablet, Take 1 tablet (20 mg total) by mouth daily., Disp: 90 tablet, Rfl:  3 .  sacubitril-valsartan (ENTRESTO) 24-26 MG, Take 1 tablet by mouth 2 (two) times daily., Disp: 180 tablet, Rfl: 3  EXAM:  VITALS per patient if applicable: Per patient blood pressure 122/60, pulse 50, respirations 16, pulse oximetry 97% and temperature of 97  GENERAL: alert, oriented, appears well and in no acute distress  HEENT: atraumatic, conjunttiva clear, no obvious abnormalities on inspection of external nose and ears  NECK: normal movements of the head and neck  LUNGS: on  inspection no signs of respiratory distress, breathing rate appears normal, no obvious gross SOB, gasping or wheezing  CV: no obvious cyanosis  MS: moves all visible extremities without noticeable abnormality  PSYCH/NEURO: pleasant and cooperative, no obvious depression or anxiety, speech and thought processing grossly intact  ASSESSMENT AND PLAN:  Discussed the following assessment and plan:  # 1 type 2 diabetes.  History of fair control -He has future labs ordered for A1c in May when he gets follow-up labs through cardiology -Refilled metformin for 1 year -Hopefully can get back in for office visit in 3 to 4 months  #2 atrial fibrillation.  Currently stable on therapy with Tikosyn, carvedilol and Eliquis     I discussed the assessment and treatment plan with the patient. The patient was provided an opportunity to ask questions and all were answered. The patient agreed with the plan and demonstrated an understanding of the instructions.   The patient was advised to call back or seek an in-person evaluation if the symptoms worsen or if the condition fails to improve as anticipated.   Carolann Littler, MD

## 2019-01-05 LAB — COMPREHENSIVE METABOLIC PANEL
ALT: 21 IU/L (ref 0–44)
AST: 22 IU/L (ref 0–40)
Albumin/Globulin Ratio: 1.6 (ref 1.2–2.2)
Albumin: 4.2 g/dL (ref 3.8–4.8)
Alkaline Phosphatase: 81 IU/L (ref 39–117)
BUN/Creatinine Ratio: 11 (ref 10–24)
BUN: 10 mg/dL (ref 8–27)
Bilirubin Total: 0.7 mg/dL (ref 0.0–1.2)
CO2: 23 mmol/L (ref 20–29)
Calcium: 9.3 mg/dL (ref 8.6–10.2)
Chloride: 102 mmol/L (ref 96–106)
Creatinine, Ser: 0.93 mg/dL (ref 0.76–1.27)
GFR calc Af Amer: 96 mL/min/{1.73_m2} (ref >59)
GFR calc non Af Amer: 83 mL/min/{1.73_m2} (ref >59)
Globulin, Total: 2.7 g/dL (ref 1.5–4.5)
Glucose: 118 mg/dL — ABNORMAL HIGH (ref 65–99)
Potassium: 4.8 mmol/L (ref 3.5–5.2)
Sodium: 140 mmol/L (ref 134–144)
Total Protein: 6.9 g/dL (ref 6.0–8.5)

## 2019-01-05 LAB — LIPID PANEL
Chol/HDL Ratio: 2.5 ratio (ref 0.0–5.0)
Cholesterol, Total: 125 mg/dL (ref 100–199)
HDL: 51 mg/dL (ref >39)
LDL Calculated: 58 mg/dL (ref 0–99)
Triglycerides: 78 mg/dL (ref 0–149)
VLDL Cholesterol Cal: 16 mg/dL (ref 5–40)

## 2019-01-05 LAB — MAGNESIUM: Magnesium: 2 mg/dL (ref 1.6–2.3)

## 2019-01-05 LAB — HEMOGLOBIN A1C
Est. average glucose Bld gHb Est-mCnc: 134 mg/dL
Hgb A1c MFr Bld: 6.3 % — ABNORMAL HIGH (ref 4.8–5.6)

## 2019-02-16 NOTE — Progress Notes (Signed)
Date:  02/16/2019   ID:  [name], DOB [birth year], MRN [id number]  PCP:  [name], MD  Cardiologist:  Hopelynn Gartland [name] MD Electrophysiologist:  [name] MD  Chief Complaint: CHF  History of Present Illness:    Christopher Navarro is a 68 y.o. male who is seen for follow up Afib and CHF.   He has a history of WPW and underwent an ablation in 1997 by Dr. Caryl Comes. He was seen by Dr. Gwenlyn Found on Jul 20, 2018 for evaluation of new onset AFib. He states that prior to Thanksgiving he noted increase fatigue. As time went on he developed more fatigue, SOB, and some chest discomfort.  He was anticoagulated. Echo showed global HK with EF 40-45%. He has been on Verapamil for rate control. He was seen in the Afib clinic and after anticoagulation he underwent DCCV on 08/15/18. He unfortunately had early return of Afib.  He has a history of NIDDM, HLD, and HTN. He also has a family history of CAD with a brother having CABG.  He was seen back in Afib clinic and options for AAD therapy reviewed. Given LV dysfunction options include Tikosyn or amiodarone. Patient's family was concerned about this approach and thought he needed further evaluation of his LV dysfunction. He was seen by me for a second opinion. On his initial visit we switched his Verapamil to Coreg due to negative inotropic effects of Verapamil. His lisinopril dose was increased. He subsequently underwent [facility] on 09/15/18. 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 Mildly elevated LV filling pressures and pulmonary HTN. Low cardiac output. His Coreg dose was increased. He was switched from lisinopril to [facility] and was also started on Jardiance. Since his visit with me in February he was seen by the Afib clinic and admitted for Tikosyn loading and he had DCCV on 10/20/18. QT remained normal.   On visit today he states he is doing very well. No SOB. Energy level is much better. No chest pain, dizziness, edema. Weight is down 11 lbs. Walking every day.       Prior CV studies:   The following studies were reviewed today:  Echo 07/25/18: Study Conclusions  - Left ventricle: The cavity size was normal. There was mild concentric hypertrophy. Systolic function was mildly to moderately reduced. The estimated ejection fraction was in the range of 40% to 45%. Diffuse hypokinesis. Features are consistent with a pseudonormal left ventricular filling pattern, with concomitant abnormal relaxation and increased filling pressure (grade 2 diastolic dysfunction). Doppler parameters are consistent with elevated ventricular end-diastolic filling pressure. - Mitral valve: There was mild regurgitation. - Left atrium: The atrium was moderately dilated. - Right ventricle: The cavity size was normal. Wall thickness was normal. Systolic function was normal. - Right atrium: The atrium was normal in size. - Tricuspid valve: There was no regurgitation. - Pericardium, extracardiac: There was no pericardial effusion.  Impressions:  - No prior study available for comparison.  Cardiac cath 09/25/18:  RIGHT/LEFT HEART CATH AND CORONARY ANGIOGRAPHY  Conclusion     Ost 1st Diag lesion is 99% stenosed.  Ost Cx to Dist Cx lesion is 25% stenosed.  There is moderate left ventricular systolic dysfunction.  The left ventricular ejection fraction is 35-45% by visual estimate.  LV end diastolic pressure is mildly elevated.  Hemodynamic findings consistent with mild pulmonary hypertension.  1. Left dominant circulation 2. 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 Global LV dysfunction. EF 40%. 4.  Mildly elevated LV filling pressures 5. Mild pulmonary venous HTN 6. Low cardiac output.   Plan: will optimize therapy for CHF. Increase Coreg dose to 25 mg bid as Afib rate is not well controlled. Consider switching lisinopril to Entresto. Consider adding Jardiance for DM. Once CHF medications optimized will  plan Tikosyn load and conversion to NSR. OK to resume Eliquis in am.       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   Diabetes mellitus type II    Dyslipidemia    [place], [place] 06/20/2009   HYPERTENSION 06/20/2009   Past Surgical History:  Procedure Laterality Date   ablasion  1997   BACK SURGERY  2006   CARDIOVERSION N/A 08/15/2018   Procedure: CARDIOVERSION;  Surgeon: Fay Records, MD;  Location: [facility] ENDOSCOPY;  Service: Cardiovascular;  Laterality: N/A;   CARDIOVERSION N/A 10/20/2018   Procedure: CARDIOVERSION;  Surgeon: Buford Dresser, MD;  Location: [place];  Service: Cardiovascular;  Laterality: N/A;   CARPAL TUNNEL RELEASE  2004   Bilateral   KNEE ARTHROSCOPY  2002   RIGHT/LEFT HEART CATH AND CORONARY ANGIOGRAPHY N/A 09/15/2018   Procedure: RIGHT/LEFT HEART CATH AND CORONARY ANGIOGRAPHY;  Surgeon: [name], Elton Catalano M, MD;  Location: [place] CV LAB;  Service: Cardiovascular;  Laterality: N/A;     No outpatient medications have been marked as taking for the 02/20/19 encounter (Appointment) with [name], Shady Bradish M, MD.     Allergies:   Ciprofloxacin   Social History   Tobacco Use   Smoking status: Former Smoker   Smokeless tobacco: Never Used  Substance Use Topics   Alcohol use: No   Drug use: No     Family Hx: The patient's family history includes Arthritis in an other family member; Hyperlipidemia in an other family member; Hypertension in an other family member; Stroke in his mother.  ROS:   Please see the history of present illness.     All other systems reviewed and are negative.   Labs/Other Tests and Data Reviewed:    Recent Labs: 07/05/2018: TSH 1.17 09/07/2018: Platelets 364 09/15/2018: Hemoglobin 15.0 01/04/2019: ALT 21; BUN 10; Creatinine, Ser 0.93; Magnesium 2.0; Potassium 4.8; Sodium 140   Recent Lipid Panel Lab Results  Component Value Date/Time    CHOL 125 01/04/2019 08:45 AM   TRIG 78 01/04/2019 08:45 AM   HDL 51 01/04/2019 08:45 AM   CHOLHDL 2.5 01/04/2019 08:45 AM   CHOLHDL 3 03/08/2018 11:12 AM   LDLCALC 58 01/04/2019 08:45 AM    Wt Readings from Last 3 Encounters:  11/09/18 211 lb 8 oz (95.9 kg)  10/28/18 216 lb (98 kg)  10/21/18 215 lb 1.6 oz (97.6 kg)     Objective:    Vital Signs:  There were no vitals taken for this visit. per patient report  Well nourished, well developed male in no acute distress. 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   ASSESSMENT & PLAN:    1.  Chronic systolicCHF with nonischemic cardiomyopathy. EF 40%.  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 Cardiac cath results as noted. Now on optimal medical therapy with  Coreg and Entresto. We will repeat Echo now.  2. Atrial fibrillation persistent and symptomatic. S/p DCCV with early return of AFib. On Eliquis. Now loaded with Tikosyn  and repeat DCCV performed on 10/20/18.  Maintaining NSR on Tikosyn. If Tikosyn fails would need consideration for ablation. 3.  Remote history of WPW s/p ablation 4. HTN controlled 5. Hypercholesterolemia. Now on Crestor. Excellent control. 6. DM type 2 on metformin. Recent addition of Jardiance with CHF. Sugars improved. Weight down.   COVID-19 Education: The signs and symptoms of COVID-19 were discussed with the patient and how to seek care for testing (follow up with PCP or arrange E-visit).  The importance of social distancing was discussed today.   Medication Adjustments/Labs and Tests Ordered: Current medicines are reviewed at length with the patient today.  Concerns regarding medicines are outlined above.  Tests Ordered: Echo  Medication Changes: No orders of the defined types were placed in this encounter.   Disposition:  Follow up 6 months.  Signed, Elva Breaker [name], MD  02/16/2019 9:15 AM    [facility]

## 2019-02-20 ENCOUNTER — Encounter: Payer: Self-pay | Admitting: Cardiology

## 2019-02-20 ENCOUNTER — Ambulatory Visit (INDEPENDENT_AMBULATORY_CARE_PROVIDER_SITE_OTHER): Payer: Medicare Other | Admitting: Cardiology

## 2019-02-20 ENCOUNTER — Other Ambulatory Visit: Payer: Self-pay

## 2019-02-20 VITALS — BP 122/70 | HR 60 | Temp 97.0°F | Ht 73.0 in | Wt 214.0 lb

## 2019-02-20 DIAGNOSIS — I5022 Chronic systolic (congestive) heart failure: Secondary | ICD-10-CM | POA: Diagnosis not present

## 2019-02-20 DIAGNOSIS — I4819 Other persistent atrial fibrillation: Secondary | ICD-10-CM

## 2019-02-20 DIAGNOSIS — E119 Type 2 diabetes mellitus without complications: Secondary | ICD-10-CM | POA: Diagnosis not present

## 2019-02-20 DIAGNOSIS — E7800 Pure hypercholesterolemia, unspecified: Secondary | ICD-10-CM

## 2019-02-20 DIAGNOSIS — I10 Essential (primary) hypertension: Secondary | ICD-10-CM

## 2019-02-20 NOTE — Patient Instructions (Signed)
Continue your current therapy  We will schedule you for an Echocardiogram  Follow up 6 months

## 2019-02-22 ENCOUNTER — Other Ambulatory Visit: Payer: Self-pay

## 2019-02-22 ENCOUNTER — Ambulatory Visit (HOSPITAL_COMMUNITY): Payer: Medicare Other | Attending: Cardiology

## 2019-02-22 DIAGNOSIS — E7800 Pure hypercholesterolemia, unspecified: Secondary | ICD-10-CM | POA: Diagnosis present

## 2019-02-22 DIAGNOSIS — I4819 Other persistent atrial fibrillation: Secondary | ICD-10-CM | POA: Diagnosis not present

## 2019-02-22 DIAGNOSIS — E119 Type 2 diabetes mellitus without complications: Secondary | ICD-10-CM

## 2019-02-22 DIAGNOSIS — I5022 Chronic systolic (congestive) heart failure: Secondary | ICD-10-CM | POA: Diagnosis not present

## 2019-02-22 DIAGNOSIS — I10 Essential (primary) hypertension: Secondary | ICD-10-CM

## 2019-03-09 ENCOUNTER — Telehealth: Payer: Self-pay | Admitting: Cardiology

## 2019-03-09 NOTE — Telephone Encounter (Signed)
Pt calling inquiring as to when Dr. [name] thinks he should, if he should follow back up with Allred. He has has a recent fu with [name] and an Echo. Pt feels he is doing well cardiac wise. Please advise

## 2019-03-09 NOTE — Telephone Encounter (Signed)
Spoke to patient he stated he is doing good.Stated heart in rhythm.Stated he does not need to see Dr.Allred at this time.Advised to keep appointment with Dr.Jordan as planned, call sooner if needed.Melissa with Dr.Allred was made aware.

## 2019-03-10 ENCOUNTER — Other Ambulatory Visit: Payer: Self-pay | Admitting: Nurse Practitioner

## 2019-03-15 ENCOUNTER — Other Ambulatory Visit: Payer: Self-pay | Admitting: Cardiology

## 2019-03-15 NOTE — Telephone Encounter (Signed)
New Message    *STAT* If patient is at the pharmacy, call can be transferred to refill team.   1. Which medications need to be refilled? (please list name of each medication and dose if known) apixaban (ELIQUIS) 5 MG TABS tablet  2. Which pharmacy/location (including street and city if local pharmacy) is medication to be sent to? [id number] - [place], [place] - [address]  3. Do they need a 30 day or 90 day supply? 90 day supply

## 2019-03-17 MED ORDER — APIXABAN 5 MG PO TABS: 5.0000 mg | ORAL_TABLET | Freq: Two times a day (BID) | ORAL | 1 refills | Status: DC

## 2019-03-17 NOTE — Telephone Encounter (Signed)
29M 97.1 kg, SCr 0.93 (12/2018) LOV PJ 02/2019

## 2019-04-03 ENCOUNTER — Other Ambulatory Visit (HOSPITAL_COMMUNITY): Payer: Self-pay | Admitting: Nurse Practitioner

## 2019-04-15 ENCOUNTER — Other Ambulatory Visit: Payer: Self-pay | Admitting: Nurse Practitioner

## 2019-08-24 ENCOUNTER — Other Ambulatory Visit: Payer: Self-pay

## 2019-08-24 MED ORDER — ROSUVASTATIN CALCIUM 20 MG PO TABS: 20.0000 mg | ORAL_TABLET | Freq: Every day | ORAL | 1 refills | Status: DC

## 2019-09-16 NOTE — Progress Notes (Signed)
Date:  09/18/2019   ID:  Christopher Navarro, DOB 1950-10-24, MRN [id number]  PCP:  [name], MD  Cardiologist:  Boby Eyer [name] MD Electrophysiologist:  [name] MD  Chief Complaint: CHF  History of Present Illness:    Christopher Navarro is a 69 y.o. male who is seen for follow up Afib and CHF.   He has a history of WPW and underwent an ablation in 1997 by Dr. Caryl Comes. He was seen by Dr. Gwenlyn Found on Jul 20, 2018 for evaluation of new onset AFib. He states that prior to Thanksgiving he noted increase fatigue. As time went on he developed more fatigue, SOB, and some chest discomfort.  He was anticoagulated. Echo showed global HK with EF 40-45%. He has been on Verapamil for rate control. He was seen in the Afib clinic and after anticoagulation he underwent DCCV on 08/15/18. He unfortunately had early return of Afib.  He has a history of NIDDM, HLD, and HTN. He also has a family history of CAD with a brother having CABG.  He was seen back in Afib clinic and options for AAD therapy reviewed. Given LV dysfunction options include Tikosyn or amiodarone. Patient's family was concerned about this approach and thought he needed further evaluation of his LV dysfunction. He was seen by me for a second opinion. On his initial visit we switched his Verapamil to Coreg due to negative inotropic effects of Verapamil. His lisinopril dose was increased. He subsequently underwent [facility] on 09/15/18. 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 Mildly elevated LV filling pressures and pulmonary HTN. Low cardiac output. His Coreg dose was increased. He was switched from lisinopril to [facility] and was also started on Jardiance. Since his visit with me in February he was seen by the Afib clinic and admitted for Tikosyn loading and he had DCCV on 10/20/18. QT remained normal. Subsequent follow up Echo in July 2020 was normal.   On visit today he states he is doing very well. No SOB. no chest pain. He is exercising regularly.  No   dizziness, edema. Weight is up some which usually happens to him in the winter.     Prior CV studies:   The following studies were reviewed today:  Echo 07/25/18: Study Conclusions  - Left ventricle: The cavity size was normal. There was mild concentric hypertrophy. Systolic function was mildly to moderately reduced. The estimated ejection fraction was in the range of 40% to 45%. Diffuse hypokinesis. Features are consistent with a pseudonormal left ventricular filling pattern, with concomitant abnormal relaxation and increased filling pressure (grade 2 diastolic dysfunction). Doppler parameters are consistent with elevated ventricular end-diastolic filling pressure. - Mitral valve: There was mild regurgitation. - Left atrium: The atrium was moderately dilated. - Right ventricle: The cavity size was normal. Wall thickness was normal. Systolic function was normal. - Right atrium: The atrium was normal in size. - Tricuspid valve: There was no regurgitation. - Pericardium, extracardiac: There was no pericardial effusion.  Impressions:  - No prior study available for comparison.  Cardiac cath 09/25/18:  RIGHT/LEFT HEART CATH AND CORONARY ANGIOGRAPHY  Conclusion     Ost 1st Diag lesion is 99% stenosed.  Ost Cx to Dist Cx lesion is 25% stenosed.  There is moderate left ventricular systolic dysfunction.  The left ventricular ejection fraction is 35-45% by visual estimate.  LV end diastolic pressure is mildly elevated.  Hemodynamic findings consistent with mild pulmonary hypertension.  1. Left dominant circulation 2. Nonobstructive CAD except  for a very small first diagonal branch 3. Global LV dysfunction. EF 40%. 4. Mildly elevated LV filling pressures 5. Mild pulmonary venous HTN 6. Low cardiac output.   Plan: will optimize therapy for CHF. Increase Coreg dose to 25 mg bid as Afib rate is not well controlled. Consider switching lisinopril to  Entresto. Consider adding Jardiance for DM. Once CHF medications optimized will plan Tikosyn load and conversion to NSR. OK to resume Eliquis in am.    Echo 02/22/19: IMPRESSIONS    1. The left ventricle has normal systolic function with an ejection  fraction of 60-65%. The cavity size was normal. Left ventricular diastolic  Doppler parameters are consistent with impaired relaxation.  2. The right ventricle has normal systolic function. The cavity was  normal. There is no increase in right ventricular wall thickness.  3. The aortic valve is tricuspid. Mild thickening of the aortic valve.  Mild calcification of the aortic valve.    Past Medical History:  Diagnosis Date  . 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 Diabetes mellitus type II   . Dyslipidemia   . [place], [place] 06/20/2009  . HYPERTENSION 06/20/2009   Past Surgical History:  Procedure Laterality Date  . ablasion  1997  . BACK SURGERY  2006  . CARDIOVERSION N/A 08/15/2018   Procedure: CARDIOVERSION;  Surgeon: Fay Records, MD;  Location: [facility] ENDOSCOPY;  Service: Cardiovascular;  Laterality: N/A;  . CARDIOVERSION N/A 10/20/2018   Procedure: CARDIOVERSION;  Surgeon: Buford Dresser, MD;  Location[facility] ENDOSCOPY;  Service: Cardiovascular;  Laterality: N/A;  . CARPAL TUNNEL RELEASE  2004   Bilateral  . KNEE ARTHROSCOPY  2002  . RIGHT/LEFT HEART CATH AND CORONARY ANGIOGRAPHY N/A 09/15/2018   Procedure: RIGHT/LEFT HEART CATH AND CORONARY ANGIOGRAPHY;  Surgeon: [name], Makaley Storts M, MD;  Location: [place] CV LAB;  Service: Cardiovascular;  Laterality: N/A;     Current Meds  Medication Sig  . apixaban (ELIQUIS) 5 MG TABS tablet Take 1 tablet (5 mg total) by mouth 2 (two) times daily.  . carvedilol (COREG) 25 MG tablet Take 0.5 tablets (12.5 mg total) by mouth 2 (two) times daily.  Marland Kitchen dofetilide (TIKOSYN) 500 MCG capsule TAKE 1 CAPSULE (500 MCG TOTAL) BY MOUTH 2 (TWO) TIMES DAILY.  Marland Kitchen  empagliflozin (JARDIANCE) 10 MG TABS tablet Take 10 mg by mouth daily.  Marland Kitchen KLOR-CON M20 20 MEQ tablet TAKE 1 TABLET BY MOUTH EVERY DAY  . metFORMIN (GLUCOPHAGE) 500 MG tablet TAKE 1 TABLET BY MOUTH TWICE A DAY WITH A MEAL  . Multiple Vitamin (MULTIVITAMIN WITH MINERALS) TABS tablet Take 1 tablet by mouth daily.  . rosuvastatin (CRESTOR) 20 MG tablet Take 1 tablet (20 mg total) by mouth daily.  . sacubitril-valsartan (ENTRESTO) 24-26 MG Take 1 tablet by mouth 2 (two) times daily.     Allergies:   Ciprofloxacin   Social History   Tobacco Use  . Smoking status: Former Research scientist (life sciences)  . Smokeless tobacco: Never Used  Substance Use Topics  . Alcohol use: No  . Drug use: No     Family Hx: The patient's family history includes Arthritis in an other family member; Hyperlipidemia in an other family member; Hypertension in an other family member; Stroke in his mother.  ROS:   Please see the history of present illness.     All other systems reviewed and are negative.   Labs/Other Tests and Data Reviewed:    Recent Labs: 01/04/2019: ALT 21;  BUN 10; Creatinine, Ser 0.93; Magnesium 2.0; Potassium 4.8; Sodium 140   Recent Lipid Panel Lab Results  Component Value Date/Time   CHOL 125 01/04/2019 08:45 AM   TRIG 78 01/04/2019 08:45 AM   HDL 51 01/04/2019 08:45 AM   CHOLHDL 2.5 01/04/2019 08:45 AM   CHOLHDL 3 03/08/2018 11:12 AM   LDLCALC 58 01/04/2019 08:45 AM    Wt Readings from Last 3 Encounters:  09/18/19 224 lb 12.8 oz (102 kg)  02/20/19 214 lb (97.1 kg)  11/09/18 211 lb 8 oz (95.9 kg)    Ecg today shows NSR rate 53. One PAC. QTc is normal 444 msec. I have personally reviewed and interpreted this study.  Objective:    Vital Signs:  BP 120/75   Pulse (!) 55   Temp (!) 96.9 F (36.1 C)   Ht 6\' 1"  (1.854 m)   Wt 224 lb 12.8 oz (102 kg)   SpO2 98%   BMI 29.66 kg/m  per patient report  GENERAL:  Well appearing WM  In NAD HEENT:  PERRL, EOMI, sclera are clear. Oropharynx is  clear. NECK:  No jugular venous distention, carotid upstroke brisk and symmetric, no bruits, no thyromegaly or adenopathy LUNGS:  Clear to auscultation bilaterally CHEST:  Unremarkable HEART:  RRR,  PMI not displaced or sustained,S1 and S2 within normal limits, no S3, no S4: no clicks, no rubs, no murmurs ABD:  Soft, nontender. BS +, no masses or bruits. No hepatomegaly, no splenomegaly EXT:  2 + pulses throughout, no edema, no cyanosis no clubbing SKIN:  Warm and dry.  No rashes NEURO:  Alert and oriented x 3. Cranial nerves II through XII intact. PSYCH:  Cognitively intact    ASSESSMENT & PLAN:    1.  Chronic systolicCHF with nonischemic cardiomyopathy. EF down to 40%.  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 Cardiac cath results as noted. Repeat Echo after restoration of sinus rhythm and optimization of medical therapy shows normalization of LV function. He is asymptomatic. Will continue Coreg, [place], [place].  2. Atrial fibrillation persistent and symptomatic. S/p DCCV with early return of AFib. On Eliquis. Now loaded with Tikosyn  and repeat DCCV performed on 10/20/18.  Maintaining NSR on Tikosyn. If Tikosyn fails would need consideration for ablation. QTc is normal today 3. Remote history of WPW s/p ablation 4. HTN controlled 5. Hypercholesterolemia. Now on Crestor. Will update lab work today. 6. DM type 2 on metformin and Jardiance. Check A1c today.   Medication Adjustments/Labs and Tests Ordered: Current medicines are reviewed at length with the patient today.  Concerns regarding medicines are outlined above.  Tests Ordered: CBC, CMET, A1c, lipids, magnesium  Medication Changes: No orders of the defined types were placed in this encounter.   Disposition:  Follow up 6 months.  Signed, Zacari Radick [name], MD  09/18/2019 8:43 AM    [facility]

## 2019-09-18 ENCOUNTER — Ambulatory Visit (INDEPENDENT_AMBULATORY_CARE_PROVIDER_SITE_OTHER): Payer: Medicare Other | Admitting: Cardiology

## 2019-09-18 ENCOUNTER — Encounter: Payer: Self-pay | Admitting: Cardiology

## 2019-09-18 ENCOUNTER — Other Ambulatory Visit: Payer: Self-pay

## 2019-09-18 VITALS — BP 120/75 | HR 55 | Temp 96.9°F | Ht 73.0 in | Wt 224.8 lb

## 2019-09-18 DIAGNOSIS — I4819 Other persistent atrial fibrillation: Secondary | ICD-10-CM | POA: Diagnosis not present

## 2019-09-18 DIAGNOSIS — I5022 Chronic systolic (congestive) heart failure: Secondary | ICD-10-CM | POA: Diagnosis not present

## 2019-09-18 DIAGNOSIS — E7800 Pure hypercholesterolemia, unspecified: Secondary | ICD-10-CM | POA: Diagnosis not present

## 2019-09-18 DIAGNOSIS — E119 Type 2 diabetes mellitus without complications: Secondary | ICD-10-CM

## 2019-09-18 DIAGNOSIS — I10 Essential (primary) hypertension: Secondary | ICD-10-CM

## 2019-09-18 LAB — CBC WITH DIFFERENTIAL/PLATELET
Basophils Absolute: 0.1 10*3/uL (ref 0.0–0.2)
Basos: 1 %
EOS (ABSOLUTE): 0.2 10*3/uL (ref 0.0–0.4)
Eos: 2 %
Hematocrit: 54 % — ABNORMAL HIGH (ref 37.5–51.0)
Hemoglobin: 17.3 g/dL (ref 13.0–17.7)
Immature Grans (Abs): 0 10*3/uL (ref 0.0–0.1)
Immature Granulocytes: 0 %
Lymphocytes Absolute: 1.7 10*3/uL (ref 0.7–3.1)
Lymphs: 25 %
MCH: 26.3 pg — ABNORMAL LOW (ref 26.6–33.0)
MCHC: 32 g/dL (ref 31.5–35.7)
MCV: 82 fL (ref 79–97)
Monocytes Absolute: 0.6 10*3/uL (ref 0.1–0.9)
Monocytes: 9 %
Neutrophils Absolute: 4.3 10*3/uL (ref 1.4–7.0)
Neutrophils: 63 %
Platelets: 296 10*3/uL (ref 150–450)
RBC: 6.59 x10E6/uL — ABNORMAL HIGH (ref 4.14–5.80)
RDW: 14.6 % (ref 11.6–15.4)
WBC: 6.9 10*3/uL (ref 3.4–10.8)

## 2019-09-18 LAB — COMPREHENSIVE METABOLIC PANEL
ALT: 15 IU/L (ref 0–44)
AST: 22 IU/L (ref 0–40)
Albumin/Globulin Ratio: 1.5 (ref 1.2–2.2)
Albumin: 4.3 g/dL (ref 3.7–4.7)
Alkaline Phosphatase: 77 IU/L (ref 39–117)
BUN/Creatinine Ratio: 11 (ref 10–24)
BUN: 10 mg/dL (ref 8–27)
Bilirubin Total: 0.6 mg/dL (ref 0.0–1.2)
CO2: 23 mmol/L (ref 20–29)
Calcium: 9.2 mg/dL (ref 8.6–10.2)
Chloride: 101 mmol/L (ref 96–106)
Creatinine, Ser: 0.93 mg/dL (ref 0.76–1.27)
GFR calc Af Amer: 95 mL/min/{1.73_m2} (ref >59)
GFR calc non Af Amer: 82 mL/min/{1.73_m2} (ref >59)
Globulin, Total: 2.9 g/dL (ref 1.5–4.5)
Glucose: 122 mg/dL — ABNORMAL HIGH (ref 65–99)
Potassium: 5 mmol/L (ref 3.5–5.2)
Sodium: 141 mmol/L (ref 134–144)
Total Protein: 7.2 g/dL (ref 6.0–8.5)

## 2019-09-18 LAB — LIPID PANEL
Chol/HDL Ratio: 2.2 ratio (ref 0.0–5.0)
Cholesterol, Total: 117 mg/dL (ref 100–199)
HDL: 53 mg/dL (ref >39)
LDL Chol Calc (NIH): 49 mg/dL (ref 0–99)
Triglycerides: 70 mg/dL (ref 0–149)
VLDL Cholesterol Cal: 15 mg/dL (ref 5–40)

## 2019-09-18 LAB — MAGNESIUM: Magnesium: 2 mg/dL (ref 1.6–2.3)

## 2019-09-18 LAB — HEMOGLOBIN A1C
Est. average glucose Bld gHb Est-mCnc: 160 mg/dL
Hgb A1c MFr Bld: 7.2 % — ABNORMAL HIGH (ref 4.8–5.6)

## 2019-09-29 ENCOUNTER — Other Ambulatory Visit: Payer: Self-pay | Admitting: Cardiology

## 2019-10-05 ENCOUNTER — Other Ambulatory Visit: Payer: Self-pay | Admitting: Cardiology

## 2019-10-29 ENCOUNTER — Other Ambulatory Visit: Payer: Self-pay | Admitting: Family Medicine

## 2020-01-15 ENCOUNTER — Other Ambulatory Visit: Payer: Self-pay | Admitting: Nurse Practitioner

## 2020-01-17 ENCOUNTER — Telehealth: Payer: Self-pay | Admitting: *Deleted

## 2020-01-17 NOTE — Telephone Encounter (Signed)
Below message received from patient via [place], [name], Peter M, MD 3 hours ago (8:35 AM)    Dr [name] This morning woke up not feeling well. Checked my karma Mobil showed I was in a-fib.   My heart rate was 132.  What do I need to do at this time. Thank you for your time  Sincerely   Christopher Navarro  Patient checked [name] and HR 70 and normal now This is the first episode since last year Patient stated is he taking medications at prescribed  Advised to continue current medications and will forward to Dr [name] for review Will only call back if any further recommendations

## 2020-01-17 NOTE — Telephone Encounter (Signed)
I would have him take an extra Coreg and arrange follow up in the Afib clinic ASAP  Seniyah Esker [name] MD, [facility]

## 2020-01-17 NOTE — Telephone Encounter (Signed)
Discussed with Dr [name] further he was not aware patient converted and HR 70 No further recommendations at this time

## 2020-01-18 ENCOUNTER — Other Ambulatory Visit: Payer: Self-pay

## 2020-01-18 ENCOUNTER — Other Ambulatory Visit: Payer: Self-pay | Admitting: Cardiology

## 2020-01-18 MED ORDER — DOFETILIDE 500 MCG PO CAPS: 500.0000 ug | ORAL_CAPSULE | Freq: Two times a day (BID) | ORAL | 3 refills | Status: DC

## 2020-01-20 ENCOUNTER — Other Ambulatory Visit: Payer: Self-pay | Admitting: Family Medicine

## 2020-02-13 ENCOUNTER — Other Ambulatory Visit: Payer: Self-pay | Admitting: Cardiology

## 2020-02-13 ENCOUNTER — Other Ambulatory Visit: Payer: Self-pay | Admitting: Family Medicine

## 2020-02-21 ENCOUNTER — Other Ambulatory Visit: Payer: Self-pay | Admitting: Family Medicine

## 2020-02-23 ENCOUNTER — Telehealth: Payer: Self-pay

## 2020-02-23 ENCOUNTER — Ambulatory Visit (INDEPENDENT_AMBULATORY_CARE_PROVIDER_SITE_OTHER): Payer: Medicare Other | Admitting: Family Medicine

## 2020-02-23 ENCOUNTER — Encounter: Payer: Self-pay | Admitting: Family Medicine

## 2020-02-23 ENCOUNTER — Other Ambulatory Visit: Payer: Self-pay

## 2020-02-23 VITALS — BP 122/64 | HR 75 | Temp 97.8°F | Wt 220.9 lb

## 2020-02-23 DIAGNOSIS — R1319 Other dysphagia: Secondary | ICD-10-CM | POA: Diagnosis not present

## 2020-02-23 DIAGNOSIS — E1165 Type 2 diabetes mellitus with hyperglycemia: Secondary | ICD-10-CM | POA: Diagnosis not present

## 2020-02-23 DIAGNOSIS — I4819 Other persistent atrial fibrillation: Secondary | ICD-10-CM

## 2020-02-23 LAB — POCT GLYCOSYLATED HEMOGLOBIN (HGB A1C): Hemoglobin A1C: 7 % — AB (ref 4.0–5.6)

## 2020-02-23 MED ORDER — METFORMIN HCL 500 MG PO TABS: ORAL_TABLET | ORAL | 3 refills | Status: DC

## 2020-02-23 NOTE — Patient Instructions (Signed)
Set up eye exam soon  Continue close follow up with cardiology.  Let's plan on 6 month follow up.

## 2020-02-23 NOTE — Telephone Encounter (Signed)
Spoke to patient about email he sent this morning.He stated he has had 3 episodes of afib this week.Stated 2 episodes lasted appox 2 hours.Stated today's episode lasted appox 13 hours.Stated presently his heart is in a normal rhythm rate 65.Stated he feels ok just tired.Stated he is concerned he has had 3 episodes.Advised I will send message to [place] for advice.

## 2020-02-23 NOTE — Progress Notes (Signed)
Established Patient Office Visit  Subjective:  Patient ID: Christopher Navarro, male    DOB: 1951-01-24  Age: 69 y.o. MRN: [id number]  CC:  Chief Complaint  Patient presents with  . Medication Refill    pt is here for refill on metformin     HPI Christopher Navarro presents for medical follow-up.  We had diagnosed him with atrial fibrillation back 07/05/2018.  He has had close follow-up with cardiology since then.  Has had a couple of cardioversions.  Unfortunately, has had some recent episodes of A. fib.  He states 1 month ago he had rate around 135 and this lasted about 2 hours.  2 weeks ago he had episodes of fatigue and noticed heart rate of 129 and this again lasted a few hours.  He states around 1 AM today he woke up slightly sweaty and rate was 129 and this was up until around 1:30 PM today.  He feels normal at this time.  He had cardiac cath 09/15/2018 with ostial first diagonal lesion 99% stenosed and at that time ejection fraction of 35 to 45%. Follow-up echo done on 02/22/2019 and ejection fraction had improved tremendously at 60 to 65%.  He has been followed to the A. fib clinic.  Current medications include Eliquis, carvedilol, Tikosyn, Entresto, Jardiance, Metformin Crestor  Type 2 diabetes.  History of fair control.  Was apparently started by cardiology on Jardiance.  A1c today 7.0%.  He states he was exercising about a hour and a half 4 times per week until about 3 weeks ago when he had first episode of recurrent A. fib.  No recent chest pain with exercise.  He has scheduled follow-up with cardiology soon.  He has not had eye exam during the past year.  He had lipids and chemistries done through cardiology back in February.  He also relates some intermittent dysphagia symptoms for the past few weeks.  He has scheduled follow-up with GI.  His appetite has been stable.  Weight is down just a few pounds from when he was seen in cardiology in February  He has received Moderna vaccine  Past  Medical History:  Diagnosis Date  . 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 Diabetes mellitus type II   . Dyslipidemia   . [place], [place] 06/20/2009  . HYPERTENSION 06/20/2009    Past Surgical History:  Procedure Laterality Date  . ablasion  1997  . BACK SURGERY  2006  . CARDIOVERSION N/A 08/15/2018   Procedure: CARDIOVERSION;  Surgeon: Fay Records, MD;  Location: [facility] ENDOSCOPY;  Service: Cardiovascular;  Laterality: N/A;  . CARDIOVERSION N/A 10/20/2018   Procedure: CARDIOVERSION;  Surgeon: Buford Dresser, MD;  Location: [facility] ENDOSCOPY;  Service: Cardiovascular;  Laterality: N/A;  . CARPAL TUNNEL RELEASE  2004   Bilateral  . KNEE ARTHROSCOPY  2002  . RIGHT/LEFT HEART CATH AND CORONARY ANGIOGRAPHY N/A 09/15/2018   Procedure: RIGHT/LEFT HEART CATH AND CORONARY ANGIOGRAPHY;  Surgeon: [name], Peter M, MD;  Location: [place] CV LAB;  Service: Cardiovascular;  Laterality: N/A;    Family History  Problem Relation Age of Onset  . Arthritis Other   . Hyperlipidemia Other   . Hypertension Other   . Stroke Mother     Social History   Socioeconomic History  . Marital status: Married    Spouse name: Not on file  . Number of children: Not on file  . Years of education: Not on file  .  Highest education level: Not on file  Occupational History  . Not on file  Tobacco Use  . Smoking status: Former Research scientist (life sciences)  . Smokeless tobacco: Never Used  Vaping Use  . Vaping Use: Never used  Substance and Sexual Activity  . Alcohol use: No  . Drug use: No  . Sexual activity: Not on file  Other Topics Concern  . Not on file  Social History Narrative  . Not on file   Social Determinants of Health   Financial Resource Strain:   . Difficulty of Paying Living Expenses:   Food Insecurity:   . Worried About Charity fundraiser in the Last Year:   . Arboriculturist in the Last Year:   Transportation Needs:   . Film/video editor (Medical):   Marland Kitchen Lack  of Transportation (Non-Medical):   Physical Activity:   . Days of Exercise per Week:   . Minutes of Exercise per Session:   Stress:   . Feeling of Stress :   Social Connections:   . Frequency of Communication with Friends and Family:   . Frequency of Social Gatherings with Friends and Family:   . Attends Religious Services:   . Active Member of Clubs or Organizations:   . Attends Archivist Meetings:   Marland Kitchen Marital Status:   Intimate Partner Violence:   . Fear of Current or Ex-Partner:   . Emotionally Abused:   Marland Kitchen Physically Abused:   . Sexually Abused:     Outpatient Medications Prior to Visit  Medication Sig Dispense Refill  . apixaban (ELIQUIS) 5 MG TABS tablet Take 1 tablet (5 mg total) by mouth 2 (two) times daily. 180 tablet 1  . carvedilol (COREG) 25 MG tablet TAKE 1 TABLET BY MOUTH TWICE A DAY 180 tablet 0  . dofetilide (TIKOSYN) 500 MCG capsule Take 1 capsule (500 mcg total) by mouth 2 (two) times daily. Please make overdue appt with Dr. Rayann Heman before anymore refills. 1st attempt 60 capsule 3  . ENTRESTO 24-26 MG TAKE 1 TABLET BY MOUTH TWICE A DAY 180 tablet 3  . JARDIANCE 10 MG TABS tablet TAKE 1 TABLET BY MOUTH EVERY DAY 90 tablet 3  . KLOR-CON M20 20 MEQ tablet TAKE 1 TABLET BY MOUTH EVERY DAY 90 tablet 1  . Multiple Vitamin (MULTIVITAMIN WITH MINERALS) TABS tablet Take 1 tablet by mouth daily.    . rosuvastatin (CRESTOR) 20 MG tablet TAKE 1 TABLET BY MOUTH EVERY DAY 90 tablet 1  . metFORMIN (GLUCOPHAGE) 500 MG tablet TAKE 1 TABLET BY MOUTH TWICE A DAY WITH MEALS NEEDS OFFICE VISIT FOR FURTHER REFILLS 30 tablet 0   No facility-administered medications prior to visit.    Allergies  Allergen Reactions  . Ciprofloxacin Hives and Rash    ROS Review of Systems  Constitutional: Positive for fatigue. Negative for chills and fever.  HENT: Negative for dental problem.   Eyes: Negative for visual disturbance.  Respiratory: Negative for cough and chest tightness.     Cardiovascular: Negative for chest pain and leg swelling.  Gastrointestinal: Negative for abdominal pain.  Endocrine: Negative for polydipsia and polyuria.  Genitourinary: Negative for dysuria.  Neurological: Negative for dizziness, syncope, weakness, light-headedness and headaches.      Objective:    Physical Exam Constitutional:      Appearance: He is well-developed.  HENT:     Right Ear: External ear normal.     Left Ear: External ear normal.  Eyes:  Pupils: Pupils are equal, round, and reactive to light.  Neck:     Thyroid: No thyromegaly.  Cardiovascular:     Rate and Rhythm: Normal rate and regular rhythm.  Pulmonary:     Effort: Pulmonary effort is normal. No respiratory distress.     Breath sounds: Normal breath sounds. No wheezing or rales.  Musculoskeletal:     Cervical back: Neck supple.     Right lower leg: No edema.     Left lower leg: No edema.  Skin:    Comments: Feet reveal no skin lesions. Good distal foot pulses. Good capillary refill. No calluses. Normal sensation with monofilament testing   Neurological:     Mental Status: He is alert and oriented to person, place, and time.     BP 122/64 (BP Location: Left Arm, Patient Position: Sitting, Cuff Size: Normal)   Pulse 75   Temp 97.8 F (36.6 C) (Temporal)   Wt 220 lb 14.4 oz (100.2 kg)   SpO2 97%   BMI 29.14 kg/m  Wt Readings from Last 3 Encounters:  02/23/20 220 lb 14.4 oz (100.2 kg)  09/18/19 224 lb 12.8 oz (102 kg)  02/20/19 214 lb (97.1 kg)     Health Maintenance Due  Topic Date Due  . OPHTHALMOLOGY EXAM  03/04/2019    There are no preventive care reminders to display for this patient.  Lab Results  Component Value Date   TSH 1.17 07/05/2018   Lab Results  Component Value Date   WBC 6.9 09/18/2019   HGB 17.3 09/18/2019   HCT 54.0 (H) 09/18/2019   MCV 82 09/18/2019   PLT 296 09/18/2019   Lab Results  Component Value Date   NA 141 09/18/2019   K 5.0 09/18/2019   CO2 23  09/18/2019   GLUCOSE 122 (H) 09/18/2019   BUN 10 09/18/2019   CREATININE 0.93 09/18/2019   BILITOT 0.6 09/18/2019   ALKPHOS 77 09/18/2019   AST 22 09/18/2019   ALT 15 09/18/2019   PROT 7.2 09/18/2019   ALBUMIN 4.3 09/18/2019   CALCIUM 9.2 09/18/2019   ANIONGAP 6 10/28/2018   GFR 80.39 07/05/2018   Lab Results  Component Value Date   CHOL 117 09/18/2019   Lab Results  Component Value Date   HDL 53 09/18/2019   Lab Results  Component Value Date   LDLCALC 49 09/18/2019   Lab Results  Component Value Date   TRIG 70 09/18/2019   Lab Results  Component Value Date   CHOLHDL 2.2 09/18/2019   Lab Results  Component Value Date   HGBA1C 7.0 (A) 02/23/2020      Assessment & Plan:   #1 type 2 diabetes stable with A1c 7.0%.  Continue Metformin and Jardiance. -Needs to set up diabetic eye exam -No indication for urine microalbumin screening with Entresto therapy -Recommend routine follow-up within 6 months to reassess  #2 intermittent atrial fibrillation.  Patient has had a few episodes of A. fib including possibly this morning around 1 AM until earlier today. -Continue Eliquis and carvedilol.  He has follow-up with cardiology soon  #3 intermittent dysphagia. -Recommend GI evaluation.  Will probably need EGD to further evaluate -Consider over-the-counter Pepcid 20 mg twice daily in the meantime for any dyspepsia symptoms  Meds ordered this encounter  Medications  . metFORMIN (GLUCOPHAGE) 500 MG tablet    Sig: Take one tablet by mouth twice daily    Dispense:  180 tablet    Refill:  3  Follow-up: Return in about 6 months (around 08/25/2020).    Carolann Littler, MD

## 2020-03-12 ENCOUNTER — Telehealth: Payer: Self-pay

## 2020-03-12 MED ORDER — APIXABAN 5 MG PO TABS: 5.0000 mg | ORAL_TABLET | Freq: Two times a day (BID) | ORAL | 1 refills | Status: DC

## 2020-03-12 NOTE — Telephone Encounter (Signed)
Spoke with pt regarding virtual appt on 03/13/20. Pt stated he will check his vitals and review his medications prior to his appt. Pt confirmed virtual visit.

## 2020-03-12 NOTE — Telephone Encounter (Signed)
Patient is on Eliquis

## 2020-03-12 NOTE — Telephone Encounter (Signed)
   [facility] Pre-operative Risk Assessment    HEARTCARE STAFF: - Please ensure there is not already an duplicate clearance open for this procedure. - Under Visit Info/Reason for Call, type in Other and utilize the format Clearance MM/DD/YY or Clearance TBD. Do not use dashes or single digits. - If request is for dental extraction, please clarify the # of teeth to be extracted.  Request for surgical clearance:  1. What type of surgery is being performed? Colonoscopy/Endoscopy   2. When is this surgery scheduled? TBD   3. What type of clearance is required (medical clearance vs. Pharmacy clearance to hold med vs. Both)? Both  4. Are there any medications that need to be held prior to surgery and how long? Eliquis   5. Practice name and name of physician performing surgery? [facility], Dr. Watt Climes   6. What is the office phone number?  [phone]   7.   What is the office fax number?  [phone]  8.   Anesthesia type (None, local, MAC, general) ? Not listed   Jacqulynn Cadet 03/12/2020, 8:33 AM  _________________________________________________________________   (provider comments below)

## 2020-03-12 NOTE — Telephone Encounter (Signed)
Patient with diagnosis of Atrial Fibrillation on Eliquis for anticoagulation.    Procedure: Colonoscopy/Endoscopy Date of procedure: TBD  CHADS2-VASc score of 5 (CHF, HTN, AGE, DM2, CAD)  CrCl 98 ml/min Platelet count 296K  Per office protocol, patient can hold Eliquis for 1 day prior to procedure since patient has been in/out of afib recently.    Patient should restart Eliquis on the evening of procedure or day after, at discretion of procedure MD  Lorel Monaco, PharmD PGY2 Ambulatory Care Resident

## 2020-03-13 ENCOUNTER — Encounter: Payer: Self-pay | Admitting: *Deleted

## 2020-03-13 ENCOUNTER — Telehealth (INDEPENDENT_AMBULATORY_CARE_PROVIDER_SITE_OTHER): Payer: Medicare Other | Admitting: Internal Medicine

## 2020-03-13 ENCOUNTER — Encounter: Payer: Self-pay | Admitting: Internal Medicine

## 2020-03-13 ENCOUNTER — Other Ambulatory Visit: Payer: Self-pay

## 2020-03-13 ENCOUNTER — Telehealth: Payer: Self-pay | Admitting: *Deleted

## 2020-03-13 VITALS — BP 128/68 | HR 52 | Ht 73.0 in | Wt 218.0 lb

## 2020-03-13 DIAGNOSIS — I5022 Chronic systolic (congestive) heart failure: Secondary | ICD-10-CM | POA: Diagnosis not present

## 2020-03-13 DIAGNOSIS — D6869 Other thrombophilia: Secondary | ICD-10-CM | POA: Diagnosis not present

## 2020-03-13 DIAGNOSIS — I10 Essential (primary) hypertension: Secondary | ICD-10-CM

## 2020-03-13 DIAGNOSIS — I4819 Other persistent atrial fibrillation: Secondary | ICD-10-CM | POA: Diagnosis not present

## 2020-03-13 DIAGNOSIS — I4891 Unspecified atrial fibrillation: Secondary | ICD-10-CM

## 2020-03-13 NOTE — Telephone Encounter (Signed)
-----   Message from [name], MD sent at 03/13/2020 10:56 AM EDT ----- Afib ablation C/I/A  Cardiac CT

## 2020-03-13 NOTE — Progress Notes (Signed)
Electrophysiology TeleHealth Note   Due to national recommendations of social distancing due to COVID 19, an audio/video telehealth visit is felt to be most appropriate for this patient at this time.  See MyChart message from today for the patient's consent to telehealth for [facility].  Date:  03/13/2020   ID:  Christopher Navarro, DOB 10/29/1950, MRN [id number]  Location: patient's home  Provider location:  [place]  Evaluation Performed: Follow-up visit  PCP:  [name], MD   Electrophysiologist:  Dr Rayann Heman  Chief Complaint:  palpitations  History of Present Illness:    Christopher Navarro is a 69 y.o. male who presents via telehealth conferencing today.  I last saw him in the hospital for his tikosyn load in 2020.  He did very well for over a year with tikosyn.  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 Over the past month, he has had return of afib.  Episodes have increased in frequency and duration.  Episodes are typically 2-23 hours in duration.  He reports fatigue and decreased exercise tolerance during episodes..  Today, he denies symptoms of palpitations, chest pain, shortness of breath,  lower extremity edema, dizziness, presyncope, or syncope.  The patient is otherwise without complaint today.   Past Medical History:  Diagnosis Date  . 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 Diabetes mellitus type II   . Dyslipidemia   . [place] 06/20/2009  . HYPERTENSION 06/20/2009    Past Surgical History:  Procedure Laterality Date  . ablasion  1997  . BACK SURGERY  2006  . CARDIOVERSION N/A 08/15/2018   Procedure: CARDIOVERSION;  Surgeon: Fay Records, MD;  Location: [facility] ENDOSCOPY;  Service: Cardiovascular;  Laterality: N/A;  . CARDIOVERSION N/A 10/20/2018   Procedure: CARDIOVERSION;  Surgeon: Buford Dresser, MD;  Location: [facility] ENDOSCOPY;  Service: Cardiovascular;  Laterality: N/A;  . CARPAL TUNNEL RELEASE  2004     Bilateral  . KNEE ARTHROSCOPY  2002  . RIGHT/LEFT HEART CATH AND CORONARY ANGIOGRAPHY N/A 09/15/2018   Procedure: RIGHT/LEFT HEART CATH AND CORONARY ANGIOGRAPHY;  Surgeon: [name], Peter M, MD;  Location: [place] CV LAB;  Service: Cardiovascular;  Laterality: N/A;    Current Outpatient Medications  Medication Sig Dispense Refill  . apixaban (ELIQUIS) 5 MG TABS tablet Take 1 tablet (5 mg total) by mouth 2 (two) times daily. 180 tablet 1  . carvedilol (COREG) 25 MG tablet TAKE 1 TABLET BY MOUTH TWICE A DAY 180 tablet 0  . dofetilide (TIKOSYN) 500 MCG capsule Take 1 capsule (500 mcg total) by mouth 2 (two) times daily. Please make overdue appt with Dr. Rayann Heman before anymore refills. 1st attempt 60 capsule 3  . ENTRESTO 24-26 MG TAKE 1 TABLET BY MOUTH TWICE A DAY 180 tablet 3  . JARDIANCE 10 MG TABS tablet TAKE 1 TABLET BY MOUTH EVERY DAY 90 tablet 3  . KLOR-CON M20 20 MEQ tablet TAKE 1 TABLET BY MOUTH EVERY DAY 90 tablet 1  . metFORMIN (GLUCOPHAGE) 500 MG tablet Take one tablet by mouth twice daily 180 tablet 3  . Multiple Vitamin (MULTIVITAMIN WITH MINERALS) TABS tablet Take 1 tablet by mouth daily.    . pantoprazole (PROTONIX) 40 MG tablet Take 40 mg by mouth daily.    . rosuvastatin (CRESTOR) 20 MG tablet TAKE 1 TABLET BY MOUTH EVERY DAY 90 tablet 1   No current facility-administered medications for this visit.  Allergies:   Ciprofloxacin   Social History:  The patient  reports that he has quit smoking. He has never used smokeless tobacco. He reports that he does not drink alcohol and does not use drugs.   ROS:  Please see the history of present illness.   All other systems are personally reviewed and negative.    Exam:    Vital Signs:  BP 128/68   Pulse (!) 52   Ht 6\' 1"  (1.854 m)   Wt 218 lb (98.9 kg)   BMI 28.76 kg/m   Well sounding and appearing, alert and conversant, regular work of breathing,  good skin color Eyes- anicteric, neuro- grossly intact, skin- no apparent  rash or lesions or cyanosis, mouth- oral mucosa is pink  Labs/Other Tests and Data Reviewed:    Recent Labs: 09/18/2019: ALT 15; BUN 10; Creatinine, Ser 0.93; Hemoglobin 17.3; Magnesium 2.0; Platelets 296; Potassium 5.0; Sodium 141   Wt Readings from Last 3 Encounters:  03/13/20 218 lb (98.9 kg)  02/23/20 220 lb 14.4 oz (100.2 kg)  09/18/19 224 lb 12.8 oz (102 kg)    Echo reviewed Dr Morrison Old notes reviewed Cath 09/15/18 reviewed  ASSESSMENT & PLAN:    1.  Persistent afib The patient has symptomatic, recurrent persistent atrial fibrillation.  He is now paroxysmal with tikosyn but continues to have afib with recent progression. he has failed medical therapy with tikosyn His prior tachycardia induced CM resolved with sinus.  I think it is important to keep him in sinus long term. Chads2vasc score is at least 5.  he is anticoagulated with eliquis . Therapeutic strategies for afib including medicine and ablation were discussed in detail with the patient today. Risk, benefits, and alternatives to EP study and radiofrequency ablation for afib were also discussed in detail today. These risks include but are not limited to stroke, bleeding, vascular damage, tamponade, perforation, damage to the esophagus, lungs, and other structures, pulmonary vein stenosis, worsening renal function, and death. The patient understands these risk and wishes to proceed.  We will therefore proceed with catheter ablation at the next available time.  Carto, ICE, anesthesia are requested for the procedure.  Will also obtain cardiac CT prior to the procedure to exclude LAA thrombus and further evaluate atrial anatomy.  2. 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 Hypertensive cardiovascular disease Stable No change required today    Risks, benefits and potential toxicities for medications prescribed and/or refilled reviewed with patient today.       Patient Risk:  after full review of this patients clinical status, I feel that they are at moderate risk at this time.  Today, I have spent 15 minutes with the patient with telehealth technology discussing arrhythmia management .    Army Fossa, MD  03/13/2020 10:41 AM     [facility] [address] [place] [address] [phone] (office) [phone] (fax)

## 2020-03-13 NOTE — Telephone Encounter (Signed)
Pt needs to be seen 03/26/20 before undergoing GI procedure for clearance  -- please let GI know.

## 2020-03-13 NOTE — Telephone Encounter (Signed)
03/26/20 appts have been update to reflect pre op clearance is needed. Will send FYI to requesting office pt has appt 03/26/20. Will send to MD for appt 03/26/20. Will remove from the pre op call back pool.

## 2020-03-13 NOTE — Telephone Encounter (Signed)
Ablation scheduled Mar 26, 2020 at 730  Orders placed   Patient aware

## 2020-03-13 NOTE — H&P (View-Only) (Signed)
Electrophysiology TeleHealth Note   Due to national recommendations of social distancing due to COVID 19, an audio/video telehealth visit is felt to be most appropriate for this patient at this time.  See MyChart message from today for the patient's consent to telehealth for [facility].  Date:  03/13/2020   ID:  Christopher Navarro, DOB 07-08-1951, MRN [id number]  Location: patient's home  Provider location:  [place]  Evaluation Performed: Follow-up visit  PCP:  [name], MD   Electrophysiologist:  Dr Rayann Heman  Chief Complaint:  palpitations  History of Present Illness:    Christopher Navarro is a 69 y.o. male who presents via telehealth conferencing today.  I last saw him in the hospital for his tikosyn load in 2020.  He did very well for over a year with tikosyn.  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 Over the past month, he has had return of afib.  Episodes have increased in frequency and duration.  Episodes are typically 2-23 hours in duration.  He reports fatigue and decreased exercise tolerance during episodes..  Today, he denies symptoms of palpitations, chest pain, shortness of breath,  lower extremity edema, dizziness, presyncope, or syncope.  The patient is otherwise without complaint today.   Past Medical History:  Diagnosis Date  . 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 Diabetes mellitus type II   . Dyslipidemia   . [place], [place] 06/20/2009  . HYPERTENSION 06/20/2009    Past Surgical History:  Procedure Laterality Date  . ablasion  1997  . BACK SURGERY  2006  . CARDIOVERSION N/A 08/15/2018   Procedure: CARDIOVERSION;  Surgeon: Fay Records, MD;  Location: [facility] ENDOSCOPY;  Service: Cardiovascular;  Laterality: N/A;  . CARDIOVERSION N/A 10/20/2018   Procedure: CARDIOVERSION;  Surgeon: Buford Dresser, MD;  Location: [facility] ENDOSCOPY;  Service: Cardiovascular;  Laterality: N/A;  . CARPAL TUNNEL RELEASE  2004     Bilateral  . KNEE ARTHROSCOPY  2002  . RIGHT/LEFT HEART CATH AND CORONARY ANGIOGRAPHY N/A 09/15/2018   Procedure: RIGHT/LEFT HEART CATH AND CORONARY ANGIOGRAPHY;  Surgeon: [name], Peter M, MD;  Location: [place] CV LAB;  Service: Cardiovascular;  Laterality: N/A;    Current Outpatient Medications  Medication Sig Dispense Refill  . apixaban (ELIQUIS) 5 MG TABS tablet Take 1 tablet (5 mg total) by mouth 2 (two) times daily. 180 tablet 1  . carvedilol (COREG) 25 MG tablet TAKE 1 TABLET BY MOUTH TWICE A DAY 180 tablet 0  . dofetilide (TIKOSYN) 500 MCG capsule Take 1 capsule (500 mcg total) by mouth 2 (two) times daily. Please make overdue appt with Dr. Rayann Heman before anymore refills. 1st attempt 60 capsule 3  . ENTRESTO 24-26 MG TAKE 1 TABLET BY MOUTH TWICE A DAY 180 tablet 3  . JARDIANCE 10 MG TABS tablet TAKE 1 TABLET BY MOUTH EVERY DAY 90 tablet 3  . KLOR-CON M20 20 MEQ tablet TAKE 1 TABLET BY MOUTH EVERY DAY 90 tablet 1  . metFORMIN (GLUCOPHAGE) 500 MG tablet Take one tablet by mouth twice daily 180 tablet 3  . Multiple Vitamin (MULTIVITAMIN WITH MINERALS) TABS tablet Take 1 tablet by mouth daily.    . pantoprazole (PROTONIX) 40 MG tablet Take 40 mg by mouth daily.    . rosuvastatin (CRESTOR) 20 MG tablet TAKE 1 TABLET BY MOUTH EVERY DAY 90 tablet 1   No current facility-administered medications for this visit.  Allergies:   Ciprofloxacin   Social History:  The patient  reports that he has quit smoking. He has never used smokeless tobacco. He reports that he does not drink alcohol and does not use drugs.   ROS:  Please see the history of present illness.   All other systems are personally reviewed and negative.    Exam:    Vital Signs:  BP 128/68   Pulse (!) 52   Ht 6\' 1"  (1.854 m)   Wt 218 lb (98.9 kg)   BMI 28.76 kg/m   Well sounding and appearing, alert and conversant, regular work of breathing,  good skin color Eyes- anicteric, neuro- grossly intact, skin- no apparent  rash or lesions or cyanosis, mouth- oral mucosa is pink  Labs/Other Tests and Data Reviewed:    Recent Labs: 09/18/2019: ALT 15; BUN 10; Creatinine, Ser 0.93; Hemoglobin 17.3; Magnesium 2.0; Platelets 296; Potassium 5.0; Sodium 141   Wt Readings from Last 3 Encounters:  03/13/20 218 lb (98.9 kg)  02/23/20 220 lb 14.4 oz (100.2 kg)  09/18/19 224 lb 12.8 oz (102 kg)    Echo reviewed Dr Morrison Old notes reviewed Cath 09/15/18 reviewed  ASSESSMENT & PLAN:    1.  Persistent afib The patient has symptomatic, recurrent persistent atrial fibrillation.  He is now paroxysmal with tikosyn but continues to have afib with recent progression. he has failed medical therapy with tikosyn His prior tachycardia induced CM resolved with sinus.  I think it is important to keep him in sinus long term. Chads2vasc score is at least 5.  he is anticoagulated with eliquis . Therapeutic strategies for afib including medicine and ablation were discussed in detail with the patient today. Risk, benefits, and alternatives to EP study and radiofrequency ablation for afib were also discussed in detail today. These risks include but are not limited to stroke, bleeding, vascular damage, tamponade, perforation, damage to the esophagus, lungs, and other structures, pulmonary vein stenosis, worsening renal function, and death. The patient understands these risk and wishes to proceed.  We will therefore proceed with catheter ablation at the next available time.  Carto, ICE, anesthesia are requested for the procedure.  Will also obtain cardiac CT prior to the procedure to exclude LAA thrombus and further evaluate atrial anatomy.  2. 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 Hypertensive cardiovascular disease Stable No change required today    Risks, benefits and potential toxicities for medications prescribed and/or refilled reviewed with patient today.       Patient Risk:  after full review of this patients clinical status, I feel that they are at moderate risk at this time.  Today, I have spent 15 minutes with the patient with telehealth technology discussing arrhythmia management .    Army Fossa, MD  03/13/2020 10:41 AM     [facility] [address] [place] [address] [phone] (office) [phone] (fax)

## 2020-03-19 ENCOUNTER — Telehealth: Payer: Self-pay | Admitting: Cardiology

## 2020-03-19 NOTE — Telephone Encounter (Signed)
Will route to nurse to make her aware.

## 2020-03-19 NOTE — Telephone Encounter (Signed)
Patient requested to speak with Dr. Doug Sou nurse, Malachy Mood. He did not state what he would like to speak with Malachy Mood in regards to, however, he stated he would like to update her. Please call.

## 2020-03-19 NOTE — Telephone Encounter (Signed)
Spoke to patient he stated he is scheduled to have a afib ablation with Dr.Allred 03/26/20.Stated he was given a follow up appointment with Dr.Jordan in [place] he would like a sooner appointment.Appointment scheduled with Dr.Jordan 04/30/20 at 10:00 am.Advised I will make Dr.Jordan aware.

## 2020-03-20 ENCOUNTER — Telehealth (HOSPITAL_COMMUNITY): Payer: Self-pay | Admitting: Emergency Medicine

## 2020-03-20 NOTE — Telephone Encounter (Signed)
Reaching out to patient to offer assistance regarding upcoming cardiac imaging study; pt verbalizes understanding of appt date/time, parking situation and where to check in, pre-test NPO status and medications ordered, and verified current allergies; name and call back number provided for further questions should they arise Caedmon Louque RN Navigator Cardiac Imaging [facility] [phone] office [phone] cell 

## 2020-03-21 ENCOUNTER — Ambulatory Visit (HOSPITAL_COMMUNITY): Payer: Medicare Other

## 2020-03-22 ENCOUNTER — Other Ambulatory Visit: Payer: Medicare Other | Admitting: *Deleted

## 2020-03-22 ENCOUNTER — Other Ambulatory Visit: Payer: Self-pay

## 2020-03-22 ENCOUNTER — Other Ambulatory Visit (HOSPITAL_COMMUNITY)
Admission: RE | Admit: 2020-03-22 | Discharge: 2020-03-22 | Disposition: A | Discharge status: Home / Self Care | Payer: Medicare Other | Source: Ambulatory Visit | Attending: Internal Medicine | Admitting: Internal Medicine

## 2020-03-22 ENCOUNTER — Ambulatory Visit (HOSPITAL_COMMUNITY)
Admission: RE | Admit: 2020-03-22 | Discharge: 2020-03-22 | Disposition: A | Discharge status: Home / Self Care | Payer: Medicare Other | Source: Ambulatory Visit | Attending: Internal Medicine | Admitting: Internal Medicine

## 2020-03-22 DIAGNOSIS — I4891 Unspecified atrial fibrillation: Secondary | ICD-10-CM

## 2020-03-22 DIAGNOSIS — Q211 Atrial septal defect: Secondary | ICD-10-CM | POA: Insufficient documentation

## 2020-03-22 DIAGNOSIS — Z20822 Contact with and (suspected) exposure to covid-19: Secondary | ICD-10-CM | POA: Diagnosis not present

## 2020-03-22 DIAGNOSIS — I4819 Other persistent atrial fibrillation: Secondary | ICD-10-CM

## 2020-03-22 LAB — CBC WITH DIFFERENTIAL/PLATELET
Basophils Absolute: 0 10*3/uL (ref 0.0–0.2)
Basos: 0 %
EOS (ABSOLUTE): 0.2 10*3/uL (ref 0.0–0.4)
Eos: 3 %
Hematocrit: 47.4 % (ref 37.5–51.0)
Hemoglobin: 15.9 g/dL (ref 13.0–17.7)
Lymphocytes Absolute: 1.9 10*3/uL (ref 0.7–3.1)
Lymphs: 28 %
MCH: 27.3 pg (ref 26.6–33.0)
MCHC: 33.5 g/dL (ref 31.5–35.7)
MCV: 81 fL (ref 79–97)
Monocytes Absolute: 0.7 10*3/uL (ref 0.1–0.9)
Monocytes: 11 %
Neutrophils Absolute: 3.9 10*3/uL (ref 1.4–7.0)
Neutrophils: 58 %
Platelets: 265 10*3/uL (ref 150–450)
RBC: 5.82 x10E6/uL — ABNORMAL HIGH (ref 4.14–5.80)
RDW: 14.9 % (ref 11.6–15.4)
WBC: 6.7 10*3/uL (ref 3.4–10.8)

## 2020-03-22 LAB — BASIC METABOLIC PANEL
BUN/Creatinine Ratio: 15 (ref 10–24)
BUN: 12 mg/dL (ref 8–27)
CO2: 30 mmol/L — ABNORMAL HIGH (ref 20–29)
Calcium: 9.1 mg/dL (ref 8.6–10.2)
Chloride: 101 mmol/L (ref 96–106)
Creatinine, Ser: 0.78 mg/dL (ref 0.76–1.27)
GFR calc Af Amer: 105 mL/min/{1.73_m2} (ref >59)
GFR calc non Af Amer: 91 mL/min/{1.73_m2} (ref >59)
Glucose: 111 mg/dL — ABNORMAL HIGH (ref 65–99)
Potassium: 4.2 mmol/L (ref 3.5–5.2)
Sodium: 139 mmol/L (ref 134–144)

## 2020-03-22 LAB — SARS CORONAVIRUS 2 (TAT 6-24 HRS): SARS Coronavirus 2: NEGATIVE

## 2020-03-22 MED ORDER — IOHEXOL 350 MG/ML SOLN
80.0000 mL | Freq: Once | INTRAVENOUS | Status: AC | PRN
  Administered 2020-03-22: 80 mL via INTRAVENOUS

## 2020-03-25 NOTE — Anesthesia Preprocedure Evaluation (Addendum)
Anesthesia Evaluation  Patient identified by MRN, date of birth, ID band Patient awake    Reviewed: Allergy & Precautions, NPO status , Patient's Chart, lab work & pertinent test results  History of Anesthesia Complications Negative for: history of anesthetic complications  Airway Mallampati: III  TM Distance: >3 FB Neck ROM: Full    Dental  (+) Dental Advisory Given, Teeth Intact   Pulmonary former smoker,    Pulmonary exam normal        Cardiovascular hypertension, Pt. on medications and Pt. on home beta blockers Normal cardiovascular exam+ dysrhythmias Atrial Fibrillation    '20 TTE - Normal EF, no significant valve issues    Neuro/Psych negative neurological ROS  negative psych ROS   GI/Hepatic negative GI ROS, Neg liver ROS,   Endo/Other  diabetes, Type 2, Oral Hypoglycemic Agents Obesity   Renal/GU negative Renal ROS     Musculoskeletal  (+) Arthritis ,   Abdominal   Peds  Hematology negative hematology ROS (+)   Anesthesia Other Findings Covid test negative   Reproductive/Obstetrics                            Anesthesia Physical Anesthesia Plan  ASA: III  Anesthesia Plan: General   Post-op Pain Management:    Induction: Intravenous  PONV Risk Score and Plan: 2 and Treatment may vary due to age or medical condition, Ondansetron and Dexamethasone  Airway Management Planned: Oral ETT  Additional Equipment: None  Intra-op Plan:   Post-operative Plan: Extubation in OR  Informed Consent: I have reviewed the patients History and Physical, chart, labs and discussed the procedure including the risks, benefits and alternatives for the proposed anesthesia with the patient or authorized representative who has indicated his/her understanding and acceptance.     Dental advisory given  Plan Discussed with: CRNA and Anesthesiologist  Anesthesia Plan Comments:         Anesthesia Quick Evaluation

## 2020-03-25 NOTE — Progress Notes (Signed)
Instructed patient on the following items: Arrival time 0530 Nothing to eat or drink after midnight No meds AM of procedure Responsible person to drive you home and stay with you for 24 hrs  Have you missed any doses of anti-coagulant On Eliquis, hasn't missed any doses, will take both doses today

## 2020-03-26 ENCOUNTER — Encounter (HOSPITAL_COMMUNITY)
Admission: RE | Disposition: A | Discharge status: Home / Self Care | Payer: Self-pay | Source: Home / Self Care | Attending: Internal Medicine

## 2020-03-26 ENCOUNTER — Ambulatory Visit: Payer: Medicare Other | Admitting: Cardiology

## 2020-03-26 ENCOUNTER — Encounter (HOSPITAL_COMMUNITY): Payer: Self-pay | Admitting: Internal Medicine

## 2020-03-26 ENCOUNTER — Other Ambulatory Visit: Payer: Self-pay

## 2020-03-26 ENCOUNTER — Ambulatory Visit (HOSPITAL_COMMUNITY): Payer: Medicare Other | Admitting: Anesthesiology

## 2020-03-26 ENCOUNTER — Ambulatory Visit (HOSPITAL_COMMUNITY)
Admission: RE | Admit: 2020-03-26 | Discharge: 2020-03-26 | Disposition: A | Discharge status: Home / Self Care | Payer: Medicare Other | Attending: Internal Medicine | Admitting: Internal Medicine

## 2020-03-26 DIAGNOSIS — Z7901 Long term (current) use of anticoagulants: Secondary | ICD-10-CM | POA: Insufficient documentation

## 2020-03-26 DIAGNOSIS — E785 Hyperlipidemia, unspecified: Secondary | ICD-10-CM | POA: Insufficient documentation

## 2020-03-26 DIAGNOSIS — Z87891 Personal history of nicotine dependence: Secondary | ICD-10-CM | POA: Insufficient documentation

## 2020-03-26 DIAGNOSIS — Z7984 Long term (current) use of oral hypoglycemic drugs: Secondary | ICD-10-CM | POA: Insufficient documentation

## 2020-03-26 DIAGNOSIS — Z881 Allergy status to other antibiotic agents status: Secondary | ICD-10-CM | POA: Insufficient documentation

## 2020-03-26 DIAGNOSIS — I4819 Other persistent atrial fibrillation: Secondary | ICD-10-CM | POA: Diagnosis not present

## 2020-03-26 DIAGNOSIS — R000 Tachycardia, unspecified: Secondary | ICD-10-CM | POA: Diagnosis not present

## 2020-03-26 DIAGNOSIS — I119 Hypertensive heart disease without heart failure: Secondary | ICD-10-CM | POA: Insufficient documentation

## 2020-03-26 DIAGNOSIS — I2510 Atherosclerotic heart disease of native coronary artery without angina pectoris: Secondary | ICD-10-CM | POA: Insufficient documentation

## 2020-03-26 DIAGNOSIS — Z79899 Other long term (current) drug therapy: Secondary | ICD-10-CM | POA: Insufficient documentation

## 2020-03-26 DIAGNOSIS — E119 Type 2 diabetes mellitus without complications: Secondary | ICD-10-CM | POA: Insufficient documentation

## 2020-03-26 HISTORY — PX: ATRIAL FIBRILLATION ABLATION: EP1191

## 2020-03-26 LAB — GLUCOSE, CAPILLARY
Glucose-Capillary: 145 mg/dL — ABNORMAL HIGH (ref 70–99)
Glucose-Capillary: 142 mg/dL — ABNORMAL HIGH (ref 70–99)

## 2020-03-26 LAB — POCT ACTIVATED CLOTTING TIME
Activated Clotting Time: 329 seconds
Activated Clotting Time: 334 seconds

## 2020-03-26 SURGERY — ATRIAL FIBRILLATION ABLATION
Anesthesia: General

## 2020-03-26 MED ORDER — PROPOFOL 10 MG/ML IV BOLUS
INTRAVENOUS | Status: DC | PRN
  Administered 2020-03-26: 130 mg via INTRAVENOUS

## 2020-03-26 MED ORDER — SODIUM CHLORIDE 0.9% FLUSH: 3.0000 mL | INTRAVENOUS | Status: DC | PRN

## 2020-03-26 MED ORDER — HEPARIN (PORCINE) IN NACL 1000-0.9 UT/500ML-% IV SOLN
INTRAVENOUS | Status: AC
  Filled 2020-03-26: qty 500

## 2020-03-26 MED ORDER — EPHEDRINE SULFATE-NACL 50-0.9 MG/10ML-% IV SOSY
PREFILLED_SYRINGE | INTRAVENOUS | Status: DC | PRN
  Administered 2020-03-26 (×2): 10 mg via INTRAVENOUS

## 2020-03-26 MED ORDER — SODIUM CHLORIDE 0.9 % IV SOLN: INTRAVENOUS | Status: DC

## 2020-03-26 MED ORDER — LIDOCAINE 2% (20 MG/ML) 5 ML SYRINGE
INTRAMUSCULAR | Status: DC | PRN
  Administered 2020-03-26: 100 mg via INTRAVENOUS

## 2020-03-26 MED ORDER — ALUM & MAG HYDROXIDE-SIMETH 200-200-20 MG/5ML PO SUSP
30.0000 mL | Freq: Once | ORAL | Status: AC
  Administered 2020-03-26: 30 mL via ORAL
  Filled 2020-03-26: qty 30

## 2020-03-26 MED ORDER — PHENYLEPHRINE HCL-NACL 10-0.9 MG/250ML-% IV SOLN
INTRAVENOUS | Status: DC | PRN
  Administered 2020-03-26: 60 ug/min via INTRAVENOUS

## 2020-03-26 MED ORDER — ROCURONIUM BROMIDE 10 MG/ML (PF) SYRINGE
PREFILLED_SYRINGE | INTRAVENOUS | Status: DC | PRN
  Administered 2020-03-26: 30 mg via INTRAVENOUS
  Administered 2020-03-26: 70 mg via INTRAVENOUS
  Administered 2020-03-26: 20 mg via INTRAVENOUS

## 2020-03-26 MED ORDER — PHENYLEPHRINE 40 MCG/ML (10ML) SYRINGE FOR IV PUSH (FOR BLOOD PRESSURE SUPPORT)
PREFILLED_SYRINGE | INTRAVENOUS | Status: DC | PRN
  Administered 2020-03-26: 120 ug via INTRAVENOUS
  Administered 2020-03-26 (×2): 80 ug via INTRAVENOUS
  Administered 2020-03-26: 120 ug via INTRAVENOUS

## 2020-03-26 MED ORDER — DEXAMETHASONE SODIUM PHOSPHATE 10 MG/ML IJ SOLN
INTRAMUSCULAR | Status: DC | PRN
  Administered 2020-03-26: 5 mg via INTRAVENOUS

## 2020-03-26 MED ORDER — SODIUM CHLORIDE 0.9% FLUSH: 3.0000 mL | Freq: Two times a day (BID) | INTRAVENOUS | Status: DC

## 2020-03-26 MED ORDER — ONDANSETRON HCL 4 MG/2ML IJ SOLN
INTRAMUSCULAR | Status: DC | PRN
  Administered 2020-03-26: 4 mg via INTRAVENOUS

## 2020-03-26 MED ORDER — FENTANYL CITRATE (PF) 100 MCG/2ML IJ SOLN
INTRAMUSCULAR | Status: DC | PRN
  Administered 2020-03-26: 100 ug via INTRAVENOUS

## 2020-03-26 MED ORDER — APIXABAN 5 MG PO TABS
5.0000 mg | ORAL_TABLET | Freq: Once | ORAL | Status: AC
  Administered 2020-03-26: 5 mg via ORAL
  Filled 2020-03-26: qty 1

## 2020-03-26 MED ORDER — ISOPROTERENOL HCL 0.2 MG/ML IJ SOLN
INTRAVENOUS | Status: DC | PRN
  Administered 2020-03-26: 10 ug/min via INTRAVENOUS

## 2020-03-26 MED ORDER — SODIUM CHLORIDE 0.9 % IV SOLN: 250.0000 mL | INTRAVENOUS | Status: DC | PRN

## 2020-03-26 MED ORDER — MIDAZOLAM HCL 2 MG/2ML IJ SOLN
INTRAMUSCULAR | Status: DC | PRN
  Administered 2020-03-26: 1 mg via INTRAVENOUS

## 2020-03-26 MED ORDER — ACETAMINOPHEN 325 MG PO TABS
650.0000 mg | ORAL_TABLET | ORAL | Status: DC | PRN
Start: 2020-03-26 — End: 2020-03-26

## 2020-03-26 MED ORDER — ISOPROTERENOL HCL 0.2 MG/ML IJ SOLN
INTRAMUSCULAR | Status: AC
  Filled 2020-03-26: qty 5

## 2020-03-26 MED ORDER — PROTAMINE SULFATE 10 MG/ML IV SOLN
INTRAVENOUS | Status: DC | PRN
Start: 2020-03-26 — End: 2020-03-26
  Administered 2020-03-26: 35 mg via INTRAVENOUS

## 2020-03-26 MED ORDER — HEPARIN SODIUM (PORCINE) 1000 UNIT/ML IJ SOLN
INTRAMUSCULAR | Status: AC
  Filled 2020-03-26: qty 1

## 2020-03-26 MED ORDER — HEPARIN SODIUM (PORCINE) 1000 UNIT/ML IJ SOLN
INTRAMUSCULAR | Status: DC | PRN
  Administered 2020-03-26: 3000 [IU] via INTRAVENOUS
  Administered 2020-03-26: 2000 [IU] via INTRAVENOUS
  Administered 2020-03-26: 17000 [IU] via INTRAVENOUS

## 2020-03-26 MED ORDER — SUGAMMADEX SODIUM 200 MG/2ML IV SOLN
INTRAVENOUS | Status: DC | PRN
  Administered 2020-03-26: 200 mg via INTRAVENOUS

## 2020-03-26 MED ORDER — HEPARIN SODIUM (PORCINE) 1000 UNIT/ML IJ SOLN
INTRAMUSCULAR | Status: DC | PRN
  Administered 2020-03-26: 1000 [IU] via INTRAVENOUS

## 2020-03-26 MED ORDER — HEPARIN (PORCINE) IN NACL 1000-0.9 UT/500ML-% IV SOLN
INTRAVENOUS | Status: DC | PRN
  Administered 2020-03-26 (×2): 500 mL

## 2020-03-26 MED ORDER — HYDROCODONE-ACETAMINOPHEN 5-325 MG PO TABS: 1.0000 | ORAL_TABLET | ORAL | Status: DC | PRN

## 2020-03-26 MED ORDER — ONDANSETRON HCL 4 MG/2ML IJ SOLN: 4.0000 mg | Freq: Four times a day (QID) | INTRAMUSCULAR | Status: DC | PRN

## 2020-03-26 SURGICAL SUPPLY — 21 items
BLANKET WARM UNDERBOD FULL ACC (MISCELLANEOUS) ×3 IMPLANT
CATH 8FR REPROCESSED SOUNDSTAR (CATHETERS) ×3 IMPLANT
CATH 8FR SOUNDSTAR REPROCESSED (CATHETERS) IMPLANT
CATH MAPPNG PENTARAY F 2-6-2MM (CATHETERS) IMPLANT
CATH S CIRCA THERM PROBE 10F (CATHETERS) ×2 IMPLANT
CATH SMTCH THERMOCOOL SF FJ (CATHETERS) ×2 IMPLANT
CATH WEBSTER BI DIR CS D-F CRV (CATHETERS) ×2 IMPLANT
COVER SWIFTLINK CONNECTOR (BAG) ×3 IMPLANT
DEVICE CLOSURE PERCLS PRGLD 6F (VASCULAR PRODUCTS) IMPLANT
PACK EP LATEX FREE (CUSTOM PROCEDURE TRAY) ×3
PACK EP LF (CUSTOM PROCEDURE TRAY) ×1 IMPLANT
PAD PRO RADIOLUCENT 2001M-C (PAD) ×3 IMPLANT
PATCH CARTO3 (PAD) ×2 IMPLANT
PENTARAY F 2-6-2MM (CATHETERS) ×3
PERCLOSE PROGLIDE 6F (VASCULAR PRODUCTS) ×9
SHEATH BAYLIS TRANSSEPTAL 98CM (NEEDLE) ×2 IMPLANT
SHEATH CARTO VIZIGO SM CVD (SHEATH) ×2 IMPLANT
SHEATH PINNACLE 8F 10CM (SHEATH) ×4 IMPLANT
SHEATH PINNACLE 9F 10CM (SHEATH) ×2 IMPLANT
SHEATH PROBE COVER 6X72 (BAG) ×2 IMPLANT
TUBING SMART ABLATE COOLFLOW (TUBING) ×2 IMPLANT

## 2020-03-26 NOTE — Anesthesia Postprocedure Evaluation (Signed)
Anesthesia Post Note  Patient: Christopher Navarro  Procedure(s) Performed: ATRIAL FIBRILLATION ABLATION (N/A )     Patient location during evaluation: PACU Anesthesia Type: General Level of consciousness: awake and alert Pain management: pain level controlled Vital Signs Assessment: post-procedure vital signs reviewed and stable Respiratory status: spontaneous breathing, nonlabored ventilation and respiratory function stable Cardiovascular status: blood pressure returned to baseline and stable Postop Assessment: no apparent nausea or vomiting Anesthetic complications: no   No complications documented.  Last Vitals:  Vitals:   03/26/20 1148 03/26/20 1203  BP: (!) 145/76 (!) 147/78  Pulse: 63 63  Resp: 17 15  Temp:    SpO2: 98% 98%    Last Pain:  Vitals:   03/26/20 1122  TempSrc:   PainSc: 0-No pain                 Audry Pili

## 2020-03-26 NOTE — Interval H&P Note (Signed)
History and Physical Interval Note:  03/26/2020 7:56 AM  Christopher Navarro  has presented today for surgery, with the diagnosis of Atrial fibrillation.  The various methods of treatment have been discussed with the patient and family. After consideration of risks, benefits and other options for treatment, the patient has consented to  Procedure(s): ATRIAL FIBRILLATION ABLATION (N/A) as a surgical intervention.  The patient's history has been reviewed, patient examined, no change in status, stable for surgery.  I have reviewed the patient's chart and labs.  Questions were answered to the patient's satisfaction.     Vickie Epley

## 2020-03-26 NOTE — Anesthesia Procedure Notes (Signed)
Procedure Name: Intubation Date/Time: 03/26/2020 7:48 AM Performed by: Janace Litten, CRNA Pre-anesthesia Checklist: Patient identified, Emergency Drugs available, Suction available and Patient being monitored Patient Re-evaluated:Patient Re-evaluated prior to induction Oxygen Delivery Method: Circle System Utilized Preoxygenation: Pre-oxygenation with 100% oxygen Induction Type: IV induction Ventilation: Mask ventilation without difficulty and Oral airway inserted - appropriate to patient size Laryngoscope Size: Mac and 4 Grade View: Grade I Tube type: Oral Tube size: 7.5 mm Number of attempts: 1 Airway Equipment and Method: Stylet Placement Confirmation: ETT inserted through vocal cords under direct vision,  positive ETCO2 and breath sounds checked- equal and bilateral Secured at: 22 cm Tube secured with: Tape Dental Injury: Teeth and Oropharynx as per pre-operative assessment

## 2020-03-26 NOTE — Progress Notes (Signed)
Up to walk and tolerated well; right groin small amt oozing, dressing changed and up to walk again and tolerated well no further oozing

## 2020-03-26 NOTE — Discharge Instructions (Signed)
Cardiac Ablation, Care After This sheet gives you information about how to care for yourself after your procedure. Your health care provider may also give you more specific instructions. If you have problems or questions, contact your health care provider. What can I expect after the procedure? After the procedure, it is common to have:  Bruising around your puncture site.  Tenderness around your puncture site.  Skipped heartbeats.  Tiredness (fatigue). Follow these instructions at home: Puncture site care   Follow instructions from your health care provider about how to take care of your puncture site. Make sure you: ? Wash your hands with soap and water before you change your bandage (dressing). If soap and water are not available, use hand sanitizer. ? Change your dressing as told by your health care provider. ? Leave stitches (sutures), skin glue, or adhesive strips in place. These skin closures may need to stay in place for up to 2 weeks. If adhesive strip edges start to loosen and curl up, you may trim the loose edges. Do not remove adhesive strips completely unless your health care provider tells you to do that.  Check your puncture site every day for signs of infection. Check for: ? Redness, swelling, or pain. ? Fluid or blood. If your puncture site starts to bleed, lie down on your back, apply firm pressure to the area, and contact your health care provider. ? Warmth. ? Pus or a bad smell. Driving  Ask your health care provider when it is safe for you to drive again after the procedure.  Do not drive or use heavy machinery while taking prescription pain medicine.  Do not drive for 24 hours if you were given a medicine to help you relax (sedative) during your procedure. Activity  Avoid activities that take a lot of effort for at least 3 days after your procedure.  Do not lift anything that is heavier than 10 lb (4.5 kg), or the limit that you are told, until your health  care provider says that it is safe.  Return to your normal activities as told by your health care provider. Ask your health care provider what activities are safe for you. General instructions  Take over-the-counter and prescription medicines only as told by your health care provider.  Do not use any products that contain nicotine or tobacco, such as cigarettes and e-cigarettes. If you need help quitting, ask your health care provider.  Do not take baths, swim, or use a hot tub until your health care provider approves.  Do not drink alcohol for 24 hours after your procedure.  Keep all follow-up visits as told by your health care provider. This is important. Contact a health care provider if:  You have redness, mild swelling, or pain around your puncture site.  You have fluid or blood coming from your puncture site that stops after applying firm pressure to the area.  Your puncture site feels warm to the touch.  You have pus or a bad smell coming from your puncture site.  You have a fever.  You have chest pain or discomfort that spreads to your neck, jaw, or arm.  You are sweating a lot.  You feel nauseous.  You have a fast or irregular heartbeat.  You have shortness of breath.  You are dizzy or light-headed and feel the need to lie down.  You have pain or numbness in the arm or leg closest to your puncture site. Get help right away if:  Your puncture   site suddenly swells.  Your puncture site is bleeding and the bleeding does not stop after applying firm pressure to the area. These symptoms may represent a serious problem that is an emergency. Do not wait to see if the symptoms will go away. Get medical help right away. Call your local emergency services (911 in the [place] Do not drive yourself to the hospital. Summary  After the procedure, it is normal to have bruising and tenderness at the puncture site in your groin, neck, or forearm.  Check your puncture site every  day for signs of infection.  Get help right away if your puncture site is bleeding and the bleeding does not stop after applying firm pressure to the area. This is a medical emergency. This information is not intended to replace advice given to you by your health care provider. Make sure you discuss any questions you have with your health care provider. Document Revised: 07/09/2017 Document Reviewed: 11/05/2016 Elsevier Patient Education  2020 [employer]  

## 2020-03-26 NOTE — Transfer of Care (Signed)
Immediate Anesthesia Transfer of Care Note  Patient: Christopher Navarro  Procedure(s) Performed: ATRIAL FIBRILLATION ABLATION (N/A )  Patient Location: PACU  Anesthesia Type:General  Level of Consciousness: awake, alert  and patient cooperative  Airway & Oxygen Therapy: Patient Spontanous Breathing  Post-op Assessment: Report given to RN and Post -op Vital signs reviewed and stable  Post vital signs: Reviewed and stable  Last Vitals:  Vitals Value Taken Time  BP 150/75 03/26/20 1029  Temp    Pulse 70 03/26/20 1030  Resp 16 03/26/20 1030  SpO2 96 % 03/26/20 1030  Vitals shown include unvalidated device data.  Last Pain:  Vitals:   03/26/20 0606  TempSrc:   PainSc: 0-No pain         Complications: No complications documented.

## 2020-03-26 NOTE — Interval H&P Note (Signed)
History and Physical Interval Note:  03/26/2020 7:26 AM  Christopher Navarro  has presented today for surgery, with the diagnosis of Atrial fibrillation.  The various methods of treatment have been discussed with the patient and family. After consideration of risks, benefits and other options for treatment, the patient has consented to  Procedure(s): ATRIAL FIBRILLATION ABLATION (N/A) as a surgical intervention.  The patient's history has been reviewed, patient examined, no change in status, stable for surgery.  I have reviewed the patient's chart and labs.  Questions were answered to the patient's satisfaction.    He reports compliance with eliquis without interruption.  Cardiac CT reviewed in detail with the patient today.  [name]

## 2020-03-27 ENCOUNTER — Encounter (HOSPITAL_COMMUNITY): Payer: Self-pay | Admitting: Internal Medicine

## 2020-04-13 ENCOUNTER — Other Ambulatory Visit: Payer: Self-pay | Admitting: Cardiology

## 2020-04-23 ENCOUNTER — Ambulatory Visit (HOSPITAL_COMMUNITY)
Admission: RE | Admit: 2020-04-23 | Discharge: 2020-04-23 | Disposition: A | Discharge status: Home / Self Care | Payer: Medicare Other | Source: Ambulatory Visit | Attending: Physician Assistant | Admitting: Physician Assistant

## 2020-04-23 ENCOUNTER — Other Ambulatory Visit: Payer: Self-pay

## 2020-04-23 ENCOUNTER — Encounter (HOSPITAL_COMMUNITY): Payer: Self-pay | Admitting: Physician Assistant

## 2020-04-23 VITALS — BP 122/70 | HR 54 | Ht 73.0 in | Wt 220.0 lb

## 2020-04-23 DIAGNOSIS — Z881 Allergy status to other antibiotic agents status: Secondary | ICD-10-CM | POA: Insufficient documentation

## 2020-04-23 DIAGNOSIS — Z8249 Family history of ischemic heart disease and other diseases of the circulatory system: Secondary | ICD-10-CM | POA: Diagnosis not present

## 2020-04-23 DIAGNOSIS — E785 Hyperlipidemia, unspecified: Secondary | ICD-10-CM | POA: Insufficient documentation

## 2020-04-23 DIAGNOSIS — I2510 Atherosclerotic heart disease of native coronary artery without angina pectoris: Secondary | ICD-10-CM | POA: Diagnosis not present

## 2020-04-23 DIAGNOSIS — Z87891 Personal history of nicotine dependence: Secondary | ICD-10-CM | POA: Diagnosis not present

## 2020-04-23 DIAGNOSIS — I110 Hypertensive heart disease with heart failure: Secondary | ICD-10-CM | POA: Insufficient documentation

## 2020-04-23 DIAGNOSIS — I5022 Chronic systolic (congestive) heart failure: Secondary | ICD-10-CM | POA: Insufficient documentation

## 2020-04-23 DIAGNOSIS — Z7984 Long term (current) use of oral hypoglycemic drugs: Secondary | ICD-10-CM | POA: Insufficient documentation

## 2020-04-23 DIAGNOSIS — D6869 Other thrombophilia: Secondary | ICD-10-CM

## 2020-04-23 DIAGNOSIS — Z79899 Other long term (current) drug therapy: Secondary | ICD-10-CM | POA: Insufficient documentation

## 2020-04-23 DIAGNOSIS — E119 Type 2 diabetes mellitus without complications: Secondary | ICD-10-CM | POA: Diagnosis not present

## 2020-04-23 DIAGNOSIS — I4819 Other persistent atrial fibrillation: Secondary | ICD-10-CM

## 2020-04-23 DIAGNOSIS — Z7901 Long term (current) use of anticoagulants: Secondary | ICD-10-CM | POA: Insufficient documentation

## 2020-04-23 LAB — MAGNESIUM: Magnesium: 2.1 mg/dL (ref 1.7–2.4)

## 2020-04-23 NOTE — Progress Notes (Signed)
Primary Care Physician: [name], MD Referring Physician: Dr. Gwenlyn Found  Cardiologist: Dr. [name] Primary EP: Dr [name] is a 69 y.o. male with a h/o HTN, DM, that was initially  referred to the afib clinic per Dr. Gwenlyn Found. He was found to be in afib on a return visit to [facility] when  irregular  heart beat was noted.He was started on eliquis 5 mg bid and continued on verapamil. He was then seen by Dr. Gwenlyn Found, echo obtained and he was set up for cardioversion 08/15/17.   Unfortunately, he had ERAF, after successful cardioversion. He was in SR for several days and could tell that he felt so much better. He is interested in pursing AAD. He was seen in the afib clinic and Tikosyn admit was planned. He then decided to get a second cardiology opinion with Dr. [name]. He had a LHC and he had Ost 1st diagonal 99% blocked and Ost Cx to Dist Cx lesion 25% stenosed. His EF was 35-45 %. It was felt that his CAD was out of proportion to his LVD. His meds were maximized for LVD and he is now back in the clinic to continue plans to admit for tikosyn.   On follow up today, patient is s/p afib ablation with Dr Rayann Heman on 03/26/20. He had several episodes of afib right after ablation and then had 3 weeks with no symptoms. He had one 3 hour episode yesterday. He did have some indigestion type symptoms after ablation but this is improving. He denies CP or groin issues.   Today, he denies symptoms of chest pain, shortness of breath, orthopnea, PND, lower extremity edema, dizziness, presyncope, syncope, or neurologic sequela. The patient is tolerating medications without difficulties and is otherwise without complaint today.   Past Medical History:  Diagnosis Date  . 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 Diabetes mellitus type II   . Dyslipidemia   . [place], [place] 06/20/2009  . HYPERTENSION 06/20/2009   Past Surgical History:  Procedure Laterality Date  . ablasion   1997  . ATRIAL FIBRILLATION ABLATION N/A 03/26/2020   Procedure: ATRIAL FIBRILLATION ABLATION;  Surgeon: [name], MD;  Location: [place] [facility];  Service: Cardiovascular;  Laterality: N/A;  . BACK SURGERY  2006  . CARDIOVERSION N/A 08/15/2018   Procedure: CARDIOVERSION;  Surgeon: Fay Records, MD;  Location: [facility] ENDOSCOPY;  Service: Cardiovascular;  Laterality: N/A;  . CARDIOVERSION N/A 10/20/2018   Procedure: CARDIOVERSION;  Surgeon: Buford Dresser, MD;  Location: [facility] ENDOSCOPY;  Service: Cardiovascular;  Laterality: N/A;  . CARPAL TUNNEL RELEASE  2004   Bilateral  . KNEE ARTHROSCOPY  2002  . RIGHT/LEFT HEART CATH AND CORONARY ANGIOGRAPHY N/A 09/15/2018   Procedure: RIGHT/LEFT HEART CATH AND CORONARY ANGIOGRAPHY;  Surgeon: [name], Peter M, MD;  Location: [place] [facility];  Service: Cardiovascular;  Laterality: N/A;    Current Outpatient Medications  Medication Sig Dispense Refill  . acetaminophen (TYLENOL) 500 MG tablet Take 1,000 mg by mouth every 6 (six) hours as needed for mild pain or moderate pain.    Marland Kitchen apixaban (ELIQUIS) 5 MG TABS tablet Take 1 tablet (5 mg total) by mouth 2 (two) times daily. 180 tablet 1  . carvedilol (COREG) 25 MG tablet TAKE 1 TABLET BY MOUTH TWICE A DAY 180 tablet 2  . dofetilide (TIKOSYN) 500 MCG capsule Take 1 capsule (500 mcg total) by mouth 2 (two) times daily. Please  make overdue appt with Dr. Rayann Heman before anymore refills. 1st attempt 60 capsule 3  . ENTRESTO 24-26 MG TAKE 1 TABLET BY MOUTH TWICE A DAY 180 tablet 3  . JARDIANCE 10 MG TABS tablet TAKE 1 TABLET BY MOUTH EVERY DAY 90 tablet 3  . KLOR-CON M20 20 MEQ tablet TAKE 1 TABLET BY MOUTH EVERY DAY 90 tablet 1  . Magnesium 250 MG TABS Take 250 mg by mouth 3 (three) times a week.    . metFORMIN (GLUCOPHAGE) 500 MG tablet Take one tablet by mouth twice daily 180 tablet 3  . Multiple Vitamin (MULTIVITAMIN WITH MINERALS) TABS tablet Take 1 tablet by mouth daily. Senior    . pantoprazole  (PROTONIX) 40 MG tablet Take 40 mg by mouth daily.    . rosuvastatin (CRESTOR) 20 MG tablet TAKE 1 TABLET BY MOUTH EVERY DAY 90 tablet 1   No current facility-administered medications for this encounter.    Allergies  Allergen Reactions  . Ciprofloxacin Hives and Rash    Social History   Socioeconomic History  . Marital status: Married    Spouse name: Not on file  . Number of children: Not on file  . Years of education: Not on file  . Highest education level: Not on file  Occupational History  . Not on file  Tobacco Use  . Smoking status: Former Research scientist (life sciences)  . Smokeless tobacco: Never Used  Vaping Use  . Vaping Use: Never used  Substance and Sexual Activity  . Alcohol use: No  . Drug use: No  . Sexual activity: Not on file  Other Topics Concern  . Not on file  Social History Narrative   retired   Investment banker, operational of Radio broadcast assistant Strain:   . Difficulty of Paying Living Expenses: Not on file  Food Insecurity:   . Worried About Charity fundraiser in the Last Year: Not on file  . Ran Out of Food in the Last Year: Not on file  Transportation Needs:   . Lack of Transportation (Medical): Not on file  . Lack of Transportation (Non-Medical): Not on file  Physical Activity:   . Days of Exercise per Week: Not on file  . Minutes of Exercise per Session: Not on file  Stress:   . Feeling of Stress : Not on file  Social Connections:   . Frequency of Communication with Friends and Family: Not on file  . Frequency of Social Gatherings with Friends and Family: Not on file  . Attends Religious Services: Not on file  . Active Member of Clubs or Organizations: Not on file  . Attends Archivist Meetings: Not on file  . Marital Status: Not on file  Intimate Partner Violence:   . Fear of Current or Ex-Partner: Not on file  . Emotionally Abused: Not on file  . Physically Abused: Not on file  . Sexually Abused: Not on file    Family History  Problem  Relation Age of Onset  . Arthritis Other   . Hyperlipidemia Other   . Hypertension Other   . Stroke Mother     ROS- All systems are reviewed and negative except as per the HPI above  Physical Exam: Vitals:   04/23/20 0859  BP: 122/70  Pulse: (!) 54  Weight: 99.8 kg  Height: 6\' 1"  (1.854 m)   Wt Readings from Last 3 Encounters:  04/23/20 99.8 kg  03/26/20 99.8 kg  03/13/20 98.9 kg    Labs: Lab Results  Component Value Date   NA 139 03/22/2020   K 4.2 03/22/2020   CL 101 03/22/2020   CO2 30 (H) 03/22/2020   GLUCOSE 111 (H) 03/22/2020   BUN 12 03/22/2020   CREATININE 0.78 03/22/2020   CALCIUM 9.1 03/22/2020   MG 2.0 09/18/2019   No results found for: INR Lab Results  Component Value Date   CHOL 117 09/18/2019   HDL 53 09/18/2019   LDLCALC 49 09/18/2019   TRIG 70 09/18/2019    GEN- The patient is well appearing, alert and oriented x 3 today.   HEENT-head normocephalic, atraumatic, sclera clear, conjunctiva pink, hearing intact, trachea midline. Lungs- Clear to ausculation bilaterally, normal work of breathing Heart- Regular rate and rhythm, bradycardia, no murmurs, rubs or gallops  GI- soft, NT, ND, + BS Extremities- no clubbing, cyanosis, or edema MS- no significant deformity or atrophy Skin- no rash or lesion Psych- euthymic mood, full affect Neuro- strength and sensation are intact   EKG- SB HR 54, PR 162, QRS 86, QTc 455  Echo 02/22/19 1. The left ventricle has normal systolic function with an ejection  fraction of 60-65%. The cavity size was normal. Left ventricular diastolic  Doppler parameters are consistent with impaired relaxation.  2. The right ventricle has normal systolic function. The cavity was  normal. There is no increase in right ventricular wall thickness.  3. The aortic valve is tricuspid. Mild thickening of the aortic valve.  Mild calcification of the aortic valve.     LHC- Ost 1st Diag lesion is 99% stenosed.  Ost Cx to Dist  Cx lesion is 25% stenosed.  There is moderate left ventricular systolic dysfunction.  The left ventricular ejection fraction is 35-45% by visual estimate.  LV end diastolic pressure is mildly elevated.  Hemodynamic findings consistent with mild pulmonary hypertension.   1. Left dominant circulation 2. 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 Global LV dysfunction. EF 40%. 4. Mildly elevated LV filling pressures 5. Mild pulmonary venous HTN 6. Low cardiac output.   Assessment and Plan:  1.Persistent atrial fibrillation S/p ablation with Dr Rayann Heman 03/26/20. Reassured patient that some afib is common for the first 3 months post ablation.  Continue dofetilide 500 mcg BID. QT stable.  Continue Coreg 25 mg BID Continue Eliquis 5 mg BID Check mag today. Recent bmet reviewed.   This patients CHA2DS2-VASc Score and unadjusted Ischemic Stroke Rate (% per year) is equal to 4.8 % stroke rate/year from a score of 4  Above score calculated as 1 point each if present [CHF, HTN, DM, Vascular=MI/PAD/Aortic Plaque, Age if 65-74, or Male] Above score calculated as 2 points each if present [Age > 75, or Stroke/TIA/TE]   2. Chronic systolic CHF Likely tachycardia mediated.  Resolved with sinus.  3. HTN Stable, no changes today.  4. CAD No anginal symptoms.   Follow up with Dr [name] and Dr Rayann Heman as scheduled.    [facility] [address] [place], [place] [address] [phone]

## 2020-04-26 NOTE — Progress Notes (Signed)
Date:  04/30/2020   ID:  Christopher Navarro, DOB [birth year], MRN [id number]  PCP:  [name], MD  Cardiologist:  Watson Robarge [name] MD Electrophysiologist:  [name] MD  Chief Complaint: CHF   History of Present Illness:    Christopher Navarro is a 69 y.o. male who is seen for follow up Afib and CHF.   He has a history of WPW and underwent an ablation in 1997 by Dr. Caryl Comes. He was seen by Dr. Gwenlyn Found on Jul 20, 2018 for evaluation of new onset AFib. He states that prior to Thanksgiving he noted increase fatigue. As time went on he developed more fatigue, SOB, and some chest discomfort.  He was anticoagulated. Echo showed global HK with EF 40-45%. He has been on Verapamil for rate control. He was seen in the Afib clinic and after anticoagulation he underwent DCCV on 08/15/18. He unfortunately had early return of Afib.  He has a history of NIDDM, HLD, and HTN. He also has a family history of CAD with a brother having CABG.  He was seen back in Afib clinic and options for AAD therapy reviewed. Given LV dysfunction options include Tikosyn or amiodarone. Patient's family was concerned about this approach and thought he needed further evaluation of his LV dysfunction. He was seen by me for a second opinion. On his initial visit we switched his Verapamil to Coreg due to negative inotropic effects of Verapamil. His lisinopril dose was increased. He subsequently underwent [facility] on 09/15/18. 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 Mildly elevated LV filling pressures and pulmonary HTN. Low cardiac output. His Coreg dose was increased. He was switched from lisinopril to [facility] and was also started on Jardiance. Since his visit with me in February he was seen by the Afib clinic and admitted for Tikosyn loading and he had DCCV on 10/20/18. QT remained normal. Subsequent follow up Echo in July 2020 was normal.   This summer he developed recurrent Afib despite Tikosyn. He was significantly symptomatic with marked  fatigue. He underwent Afib ablation by Dr Rayann Heman on 03/26/20.   On visit today he states he is doing very well. No SOB. No chest pain. His weight is down a little. No Afib in the last couple of weeks.   No  dizziness, edema.     Prior CV studies:   The following studies were reviewed today:  Echo 07/25/18: Study Conclusions  - Left ventricle: The cavity size was normal. There was mild concentric hypertrophy. Systolic function was mildly to moderately reduced. The estimated ejection fraction was in the range of 40% to 45%. Diffuse hypokinesis. Features are consistent with a pseudonormal left ventricular filling pattern, with concomitant abnormal relaxation and increased filling pressure (grade 2 diastolic dysfunction). Doppler parameters are consistent with elevated ventricular end-diastolic filling pressure. - Mitral valve: There was mild regurgitation. - Left atrium: The atrium was moderately dilated. - Right ventricle: The cavity size was normal. Wall thickness was normal. Systolic function was normal. - Right atrium: The atrium was normal in size. - Tricuspid valve: There was no regurgitation. - Pericardium, extracardiac: There was no pericardial effusion.  Impressions:  - No prior study available for comparison.  Cardiac cath 09/25/18:  RIGHT/LEFT HEART CATH AND CORONARY ANGIOGRAPHY  Conclusion     Ost 1st Diag lesion is 99% stenosed.  Ost Cx to Dist Cx lesion is 25% stenosed.  There is moderate left ventricular systolic dysfunction.  The left ventricular ejection fraction is 35-45% by visual  estimate.  LV end diastolic pressure is mildly elevated.  Hemodynamic findings consistent with mild pulmonary hypertension.  1. Left dominant circulation 2. 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 Global LV dysfunction. EF 40%. 4. Mildly elevated LV filling pressures 5. Mild pulmonary venous HTN 6. Low cardiac output.    Plan: will optimize therapy for CHF. Increase Coreg dose to 25 mg bid as Afib rate is not well controlled. Consider switching lisinopril to Entresto. Consider adding Jardiance for DM. Once CHF medications optimized will plan Tikosyn load and conversion to NSR. OK to resume Eliquis in am.    Echo 02/22/19: IMPRESSIONS    1. The left ventricle has normal systolic function with an ejection  fraction of 60-65%. The cavity size was normal. Left ventricular diastolic  Doppler parameters are consistent with impaired relaxation.  2. The right ventricle has normal systolic function. The cavity was  normal. There is no increase in right ventricular wall thickness.  3. The aortic valve is tricuspid. Mild thickening of the aortic valve.  Mild calcification of the aortic valve.    Past Medical History:  Diagnosis Date  . 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 Diabetes mellitus type II   . Dyslipidemia   . [place], [place] 06/20/2009  . HYPERTENSION 06/20/2009   Past Surgical History:  Procedure Laterality Date  . ablasion  1997  . ATRIAL FIBRILLATION ABLATION N/A 03/26/2020   Procedure: ATRIAL FIBRILLATION ABLATION;  Surgeon: [name], MD;  Location: [place] [facility];  Service: Cardiovascular;  Laterality: N/A;  . BACK SURGERY  2006  . CARDIOVERSION N/A 08/15/2018   Procedure: CARDIOVERSION;  Surgeon: Fay Records, MD;  Location: [facility] ENDOSCOPY;  Service: Cardiovascular;  Laterality: N/A;  . CARDIOVERSION N/A 10/20/2018   Procedure: CARDIOVERSION;  Surgeon: Buford Dresser, MD;  Location: [facility] ENDOSCOPY;  Service: Cardiovascular;  Laterality: N/A;  . CARPAL TUNNEL RELEASE  2004   Bilateral  . KNEE ARTHROSCOPY  2002  . RIGHT/LEFT HEART CATH AND CORONARY ANGIOGRAPHY N/A 09/15/2018   Procedure: RIGHT/LEFT HEART CATH AND CORONARY ANGIOGRAPHY;  Surgeon: [name], Keymani Glynn M, MD;  Location: [place] [facility];  Service: Cardiovascular;  Laterality: N/A;      Current Meds  Medication Sig  . acetaminophen (TYLENOL) 500 MG tablet Take 1,000 mg by mouth every 6 (six) hours as needed for mild pain or moderate pain.  Marland Kitchen apixaban (ELIQUIS) 5 MG TABS tablet Take 1 tablet (5 mg total) by mouth 2 (two) times daily.  . carvedilol (COREG) 25 MG tablet TAKE 1 TABLET BY MOUTH TWICE A DAY  . dofetilide (TIKOSYN) 500 MCG capsule Take 1 capsule (500 mcg total) by mouth 2 (two) times daily. Please make overdue appt with Dr. Rayann Heman before anymore refills. 1st attempt  . ENTRESTO 24-26 MG TAKE 1 TABLET BY MOUTH TWICE A DAY  . JARDIANCE 10 MG TABS tablet TAKE 1 TABLET BY MOUTH EVERY DAY  . KLOR-CON M20 20 MEQ tablet TAKE 1 TABLET BY MOUTH EVERY DAY  . Magnesium 250 MG TABS Take 250 mg by mouth 3 (three) times a week.  . metFORMIN (GLUCOPHAGE) 500 MG tablet Take one tablet by mouth twice daily  . Multiple Vitamin (MULTIVITAMIN WITH MINERALS) TABS tablet Take 1 tablet by mouth daily. Senior  . pantoprazole (PROTONIX) 40 MG tablet Take 40 mg by mouth daily.  . rosuvastatin (CRESTOR) 20 MG tablet TAKE 1 TABLET BY MOUTH EVERY DAY  Allergies:   Ciprofloxacin   Social History   Tobacco Use  . Smoking status: Former Research scientist (life sciences)  . Smokeless tobacco: Never Used  Vaping Use  . Vaping Use: Never used  Substance Use Topics  . Alcohol use: No  . Drug use: No     Family Hx: The patient's family history includes Arthritis in an other family member; Hyperlipidemia in an other family member; Hypertension in an other family member; Stroke in his mother.  ROS:   Please see the history of present illness.     All other systems reviewed and are negative.   Labs/Other Tests and Data Reviewed:    Recent Labs: 09/18/2019: ALT 15 03/22/2020: BUN 12; Creatinine, Ser 0.78; Hemoglobin 15.9; Platelets 265; Potassium 4.2; Sodium 139 04/23/2020: Magnesium 2.1   Recent Lipid Panel Lab Results  Component Value Date/Time   CHOL 117 09/18/2019 09:01 AM   TRIG 70 09/18/2019 09:01  AM   HDL 53 09/18/2019 09:01 AM   CHOLHDL 2.2 09/18/2019 09:01 AM   CHOLHDL 3 03/08/2018 11:12 AM   LDLCALC 49 09/18/2019 09:01 AM    Wt Readings from Last 3 Encounters:  04/30/20 217 lb (98.4 kg)  04/23/20 220 lb (99.8 kg)  03/26/20 220 lb (99.8 kg)    Ecg today shows NSR rate 53. One PAC. QTc is normal 444 msec. I have personally reviewed and interpreted this study.  Objective:    Vital Signs:  BP 132/74   Pulse (!) 57   Ht 6\' 1"  (1.854 m)   Wt 217 lb (98.4 kg)   SpO2 99%   BMI 28.63 kg/m  per patient report  GENERAL:  Well appearing WM  In NAD HEENT:  PERRL, EOMI, sclera are clear. Oropharynx is clear. NECK:  No jugular venous distention, carotid upstroke brisk and symmetric, no bruits, no thyromegaly or adenopathy LUNGS:  Clear to auscultation bilaterally CHEST:  Unremarkable HEART:  RRR,  PMI not displaced or sustained,S1 and S2 within normal limits, no S3, no S4: no clicks, no rubs, no murmurs ABD:  Soft, nontender. BS +, no masses or bruits. No hepatomegaly, no splenomegaly EXT:  2 + pulses throughout, no edema, no cyanosis no clubbing SKIN:  Warm and dry.  No rashes NEURO:  Alert and oriented x 3. Cranial nerves II through XII intact. PSYCH:  Cognitively intact    ASSESSMENT & PLAN:    1.  Chronic systolicCHF with nonischemic cardiomyopathy. EF down to 40% before.  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 Cardiac cath results as noted. Repeat Echo after restoration of sinus rhythm and optimization of medical therapy shows normalization of LV function. He is asymptomatic. Will continue Coreg, [place], [place].   2. Atrial fibrillation persistent and symptomatic. S/p DCCV with early return of AFib. Then placed on Tikosyn  and repeat DCCV performed on 10/20/18.  Had recurrent Afib this summer and is now s/p ablation in August.   3. Remote history of WPW s/p ablation  4. HTN controlled  5. Hypercholesterolemia. Now on Crestor. At goal.   6. DM type 2 on  metformin and Jardiance. Last A1c 7%.    Medication Adjustments/Labs and Tests Ordered: Current medicines are reviewed at length with the patient today.  Concerns regarding medicines are outlined above.  Tests Ordered: CBC, CMET, A1c, lipids, magnesium  Medication Changes: No orders of the defined types were placed in this encounter.   Disposition:  Follow up 6 months.  Signed, Leigh Kaeding [name], MD  04/30/2020 10:13 AM  [facility]

## 2020-04-30 ENCOUNTER — Encounter: Payer: Self-pay | Admitting: Cardiology

## 2020-04-30 ENCOUNTER — Ambulatory Visit (INDEPENDENT_AMBULATORY_CARE_PROVIDER_SITE_OTHER): Payer: Medicare Other | Admitting: Cardiology

## 2020-04-30 ENCOUNTER — Other Ambulatory Visit: Payer: Self-pay

## 2020-04-30 VITALS — BP 132/74 | HR 57 | Ht 73.0 in | Wt 217.0 lb

## 2020-04-30 DIAGNOSIS — I4819 Other persistent atrial fibrillation: Secondary | ICD-10-CM | POA: Diagnosis not present

## 2020-04-30 DIAGNOSIS — I420 Dilated cardiomyopathy: Secondary | ICD-10-CM | POA: Diagnosis not present

## 2020-04-30 DIAGNOSIS — I5022 Chronic systolic (congestive) heart failure: Secondary | ICD-10-CM

## 2020-04-30 DIAGNOSIS — E7800 Pure hypercholesterolemia, unspecified: Secondary | ICD-10-CM | POA: Diagnosis not present

## 2020-04-30 DIAGNOSIS — E119 Type 2 diabetes mellitus without complications: Secondary | ICD-10-CM

## 2020-05-29 ENCOUNTER — Ambulatory Visit: Payer: Medicare Other | Admitting: Cardiology

## 2020-06-19 ENCOUNTER — Telehealth: Payer: Self-pay

## 2020-06-19 NOTE — Telephone Encounter (Signed)
   [facility] Pre-operative Risk Assessment    HEARTCARE STAFF: - Please ensure there is not already an duplicate clearance open for this procedure. - Under Visit Info/Reason for Call, type in Other and utilize the format Clearance MM/DD/YY or Clearance TBD. Do not use dashes or single digits. - If request is for dental extraction, please clarify the # of teeth to be extracted.  Request for surgical clearance:  1. What type of surgery is being performed? Colonoscopy/endoscopy   2. When is this surgery scheduled? January 2022   3. What type of clearance is required (medical clearance vs. Pharmacy clearance to hold med vs. Both)? Medical  4. Are there any medications that need to be held prior to surgery and how long?Eliquis TBD   5. Practice name and name of physician performing surgery? [facility], Dr. Watt Climes   6. What is the office phone number? [phone]   7.   What is the office fax number? [phone]  8.   Anesthesia type (None, local, MAC, general) ? Propofol   Monia Pouch 06/19/2020, 3:01 PM  _________________________________________________________________   (provider comments below)

## 2020-06-20 NOTE — Telephone Encounter (Signed)
   Primary Cardiologist: Peter [name], MD  Chart reviewed as part of pre-operative protocol coverage. Patient was contacted 06/20/2020 in reference to pre-operative risk assessment for pending surgery as outlined below.  ALPHEUS STIFF was last seen on 04/30/20 by Dr. [name].  Since that day, ANTONY SIAN has done well from a cardiac standpoint. He goes to the gym 4 days per week doing aerobic activities and lifting weights. He can easily complete 4 METs without anginal complaints  Therefore, based on ACC/AHA guidelines, the patient would be at acceptable risk for the planned procedure without further cardiovascular testing.   The patient was advised that if he develops new symptoms prior to surgery to contact our office to arrange for a follow-up visit, and he verbalized understanding.  Per pharmacy recommendations, patient can hold eliquis 1-2 days prior to his upcoming endoscopy/colonoscopy and should restart his eliquis when cleared to do so by his gastroenterologist.   I will route this recommendation to the requesting party via [place] fax function and remove from pre-op pool. Please call with questions.  Abigail Butts, PA-C 06/20/2020, 8:29 AM

## 2020-06-20 NOTE — Telephone Encounter (Signed)
Patient with diagnosis of afib on Eliquis for anticoagulation.    Procedure:  Colonoscopy/endoscopy  Date of procedure: Jan 2022    CHA2DS2-VASc Score = 4  This indicates a 4.8% annual risk of stroke. The patient's score is based upon: CHF History: 1 HTN History: 1 Diabetes History: 1 Stroke History: 0 Vascular Disease History: 0 Age Score: 1 Gender Score: 0     CrCl 98 ml/min  Per office protocol, patient can hold Eliquis for 1-2 days prior to procedure.

## 2020-06-26 ENCOUNTER — Other Ambulatory Visit: Payer: Self-pay

## 2020-06-26 ENCOUNTER — Ambulatory Visit (INDEPENDENT_AMBULATORY_CARE_PROVIDER_SITE_OTHER): Payer: Medicare Other | Admitting: Internal Medicine

## 2020-06-26 ENCOUNTER — Encounter: Payer: Self-pay | Admitting: Internal Medicine

## 2020-06-26 VITALS — BP 112/60 | HR 59 | Ht 73.0 in | Wt 221.6 lb

## 2020-06-26 DIAGNOSIS — I428 Other cardiomyopathies: Secondary | ICD-10-CM | POA: Diagnosis not present

## 2020-06-26 DIAGNOSIS — I10 Essential (primary) hypertension: Secondary | ICD-10-CM | POA: Diagnosis not present

## 2020-06-26 DIAGNOSIS — I4819 Other persistent atrial fibrillation: Secondary | ICD-10-CM

## 2020-06-26 DIAGNOSIS — D6869 Other thrombophilia: Secondary | ICD-10-CM | POA: Diagnosis not present

## 2020-06-26 LAB — BASIC METABOLIC PANEL
BUN/Creatinine Ratio: 13 (ref 10–24)
BUN: 12 mg/dL (ref 8–27)
CO2: 25 mmol/L (ref 20–29)
Calcium: 9.4 mg/dL (ref 8.6–10.2)
Chloride: 99 mmol/L (ref 96–106)
Creatinine, Ser: 0.94 mg/dL (ref 0.76–1.27)
GFR calc Af Amer: 94 mL/min/{1.73_m2} (ref >59)
GFR calc non Af Amer: 81 mL/min/{1.73_m2} (ref >59)
Glucose: 109 mg/dL — ABNORMAL HIGH (ref 65–99)
Potassium: 4.5 mmol/L (ref 3.5–5.2)
Sodium: 135 mmol/L (ref 134–144)

## 2020-06-26 LAB — MAGNESIUM: Magnesium: 2.1 mg/dL (ref 1.6–2.3)

## 2020-06-26 MED ORDER — DOFETILIDE 500 MCG PO CAPS: 500.0000 ug | ORAL_CAPSULE | Freq: Two times a day (BID) | ORAL | 3 refills | Status: DC

## 2020-06-26 NOTE — Patient Instructions (Addendum)
Medication Instructions:  Your physician recommends that you continue on your current medications as directed. Please refer to the Current Medication list given to you today.  *If you need a refill on your cardiac medications before your next appointment, please call your pharmacy*  Lab Work: BMP, Mag  If you have labs (blood work) drawn today and your tests are completely normal, you will receive your results only by: Marland Kitchen MyChart Message (if you have MyChart) OR . A paper copy in the mail If you have any lab test that is abnormal or we need to change your treatment, we will call you to review the results.  Testing/Procedures: Please schedule Echo then [place] in 3 months Your physician has requested that you have an echocardiogram. Echocardiography is a painless test that uses sound waves to create images of your heart. It provides your doctor with information about the size and shape of your heart and how well your heart's chambers and valves are working. This procedure takes approximately one hour. There are no restrictions for this procedure.  Follow-Up: At [facility], you and your health needs are our priority.  As part of our continuing mission to provide you with exceptional heart care, we have created designated Provider Care Teams.  These Care Teams include your primary Cardiologist (physician) and Advanced Practice Providers (APPs -  Physician Assistants and Nurse Practitioners) who all work together to provide you with the care you need, when you need it.  We recommend signing up for the patient portal called "MyChart".  Sign up information is provided on this After Visit Summary.  MyChart is used to connect with patients for Virtual Visits (Telemedicine).  Patients are able to view lab/test results, encounter notes, upcoming appointments, etc.  Non-urgent messages can be sent to your provider as well.   To learn more about what you can do with MyChart, go to [email or web address].     Your next appointment:   Your physician wants you to follow-up in: 3 months     Other Instructions:

## 2020-06-26 NOTE — Progress Notes (Signed)
PCP: [name], MD Primary Cardiologist: Dr [name]  Christopher Navarro is a 69 y.o. male who presents today for routine electrophysiology followup.  Since his recent afib ablation, the patient reports doing very well.  he denies procedure related complications and is pleased with the results of the procedure.  Today, he denies symptoms of palpitations, chest pain, shortness of breath,  lower extremity edema, dizziness, presyncope, or syncope.  The patient is otherwise without complaint today.   Past Medical History:  Diagnosis Date  . 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 Diabetes mellitus type II   . Dyslipidemia   . [place], [place] 06/20/2009  . HYPERTENSION 06/20/2009   Past Surgical History:  Procedure Laterality Date  . ablasion  1997  . ATRIAL FIBRILLATION ABLATION N/A 03/26/2020   Procedure: ATRIAL FIBRILLATION ABLATION;  Surgeon: [name], MD;  Location: [place] [facility];  Service: Cardiovascular;  Laterality: N/A;  . BACK SURGERY  2006  . CARDIOVERSION N/A 08/15/2018   Procedure: CARDIOVERSION;  Surgeon: Fay Records, MD;  Location: [facility] ENDOSCOPY;  Service: Cardiovascular;  Laterality: N/A;  . CARDIOVERSION N/A 10/20/2018   Procedure: CARDIOVERSION;  Surgeon: Buford Dresser, MD;  Location: [facility] ENDOSCOPY;  Service: Cardiovascular;  Laterality: N/A;  . CARPAL TUNNEL RELEASE  2004   Bilateral  . KNEE ARTHROSCOPY  2002  . RIGHT/LEFT HEART CATH AND CORONARY ANGIOGRAPHY N/A 09/15/2018   Procedure: RIGHT/LEFT HEART CATH AND CORONARY ANGIOGRAPHY;  Surgeon: [name], Peter M, MD;  Location: [place] [facility];  Service: Cardiovascular;  Laterality: N/A;    ROS- all systems are personally reviewed and negatives except as per HPI above  Current Outpatient Medications  Medication Sig Dispense Refill  . acetaminophen (TYLENOL) 500 MG tablet Take 1,000 mg by mouth every 6 (six) hours as needed for mild pain or moderate pain.    Marland Kitchen apixaban  (ELIQUIS) 5 MG TABS tablet Take 1 tablet (5 mg total) by mouth 2 (two) times daily. 180 tablet 1  . carvedilol (COREG) 25 MG tablet TAKE 1 TABLET BY MOUTH TWICE A DAY 180 tablet 2  . dofetilide (TIKOSYN) 500 MCG capsule Take 1 capsule (500 mcg total) by mouth 2 (two) times daily. Please make overdue appt with Dr. Rayann Heman before anymore refills. 1st attempt 60 capsule 3  . ENTRESTO 24-26 MG TAKE 1 TABLET BY MOUTH TWICE A DAY 180 tablet 3  . JARDIANCE 10 MG TABS tablet TAKE 1 TABLET BY MOUTH EVERY DAY 90 tablet 3  . Magnesium 250 MG TABS Take 250 mg by mouth 3 (three) times a week.    . metFORMIN (GLUCOPHAGE) 500 MG tablet Take one tablet by mouth twice daily 180 tablet 3  . Multiple Vitamin (MULTIVITAMIN WITH MINERALS) TABS tablet Take 1 tablet by mouth daily. Senior    . pantoprazole (PROTONIX) 40 MG tablet Take 40 mg by mouth daily.    . rosuvastatin (CRESTOR) 20 MG tablet TAKE 1 TABLET BY MOUTH EVERY DAY 90 tablet 1   No current facility-administered medications for this visit.    Physical Exam: Vitals:   06/26/20 0943  BP: 112/60  Pulse: (!) 59  SpO2: 97%  Weight: 221 lb 9.6 oz (100.5 kg)  Height: 6\' 1"  (1.854 m)    GEN- The patient is well appearing, alert and oriented x 3 today.   Head- normocephalic, atraumatic Eyes-  Sclera clear, conjunctiva pink Ears- hearing intact Oropharynx- clear Lungs- Clear to ausculation bilaterally,  normal work of breathing Heart- Regular rate and rhythm, no murmurs, rubs or gallops, PMI not laterally displaced GI- soft, NT, ND, + BS Extremities- no clubbing, cyanosis, or edema  EKG tracing ordered today is personally reviewed and shows sinus rhythm, qtc 447 msec  Assessment and Plan:  1. Persistent atrial fibrillation Doing well s/p ablation continue tikosyn for now.  We may consider stopping on follow-up Bmet, mg today He requires close follow-up on this medicine to avoid toxicity chads2vasc score is at least 5.  Continue on eliquis  2.  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 HTN Stable No change required today  4. CAD No ischemic symptoms No changes  Return to see me in 3 months  [name] MD, [facility] 06/26/2020 9:54 AM

## 2020-09-03 ENCOUNTER — Other Ambulatory Visit: Payer: Self-pay | Admitting: Cardiology

## 2020-09-12 ENCOUNTER — Other Ambulatory Visit: Payer: Self-pay | Admitting: Cardiology

## 2020-09-15 ENCOUNTER — Other Ambulatory Visit: Payer: Self-pay | Admitting: Cardiology

## 2020-09-25 ENCOUNTER — Other Ambulatory Visit: Payer: Self-pay | Admitting: Cardiology

## 2020-10-02 ENCOUNTER — Other Ambulatory Visit: Payer: Self-pay

## 2020-10-02 ENCOUNTER — Ambulatory Visit (HOSPITAL_COMMUNITY): Payer: Medicare Other | Attending: Cardiology

## 2020-10-02 DIAGNOSIS — I4819 Other persistent atrial fibrillation: Secondary | ICD-10-CM | POA: Insufficient documentation

## 2020-10-02 DIAGNOSIS — I428 Other cardiomyopathies: Secondary | ICD-10-CM | POA: Diagnosis not present

## 2020-10-02 DIAGNOSIS — D6869 Other thrombophilia: Secondary | ICD-10-CM | POA: Diagnosis not present

## 2020-10-02 DIAGNOSIS — I10 Essential (primary) hypertension: Secondary | ICD-10-CM | POA: Diagnosis not present

## 2020-10-02 LAB — ECHOCARDIOGRAM COMPLETE
Area-P 1/2: 3.2 cm2
S' Lateral: 3.7 cm

## 2020-10-09 ENCOUNTER — Ambulatory Visit (INDEPENDENT_AMBULATORY_CARE_PROVIDER_SITE_OTHER): Payer: Medicare Other | Admitting: Internal Medicine

## 2020-10-09 ENCOUNTER — Encounter: Payer: Self-pay | Admitting: Internal Medicine

## 2020-10-09 ENCOUNTER — Other Ambulatory Visit: Payer: Self-pay

## 2020-10-09 VITALS — BP 118/68 | HR 52 | Ht 73.0 in | Wt 223.0 lb

## 2020-10-09 DIAGNOSIS — I4819 Other persistent atrial fibrillation: Secondary | ICD-10-CM

## 2020-10-09 DIAGNOSIS — I2510 Atherosclerotic heart disease of native coronary artery without angina pectoris: Secondary | ICD-10-CM | POA: Diagnosis not present

## 2020-10-09 DIAGNOSIS — I10 Essential (primary) hypertension: Secondary | ICD-10-CM | POA: Diagnosis not present

## 2020-10-09 DIAGNOSIS — I420 Dilated cardiomyopathy: Secondary | ICD-10-CM

## 2020-10-09 MED ORDER — CARVEDILOL 12.5 MG PO TABS
12.5000 mg | ORAL_TABLET | Freq: Two times a day (BID) | ORAL | 3 refills | Status: DC
Start: 2020-10-09 — End: 2021-01-07

## 2020-10-09 NOTE — Progress Notes (Signed)
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 male who presents today for routine electrophysiology followup.  Since last being seen in our clinic, the patient reports doing very well.  He has an [name] which he enjoys driving.  We discussed cars at length today. Today, he denies symptoms of palpitations, chest pain, shortness of breath,  lower extremity edema, dizziness, presyncope, or syncope.  The patient is otherwise without complaint today.   Past Medical History:  Diagnosis Date  . 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 Diabetes mellitus type II   . Dyslipidemia   . [name], [name] 06/20/2009  . HYPERTENSION 06/20/2009   Past Surgical History:  Procedure Laterality Date  . ablasion  1997  . ATRIAL FIBRILLATION ABLATION N/A 03/26/2020   Procedure: ATRIAL FIBRILLATION ABLATION;  Surgeon: Christopher Grayer, MD;  Location: [place] CV LAB;  Service: Cardiovascular;  Laterality: N/A;  . BACK SURGERY  2006  . CARDIOVERSION N/A 08/15/2018   Procedure: CARDIOVERSION;  Surgeon: Christopher Records, MD;  Location: [facility] ENDOSCOPY;  Service: Cardiovascular;  Laterality: N/A;  . CARDIOVERSION N/A 10/20/2018   Procedure: CARDIOVERSION;  Surgeon: Christopher Dresser, MD;  Location: [facility] ENDOSCOPY;  Service: Cardiovascular;  Laterality: N/A;  . CARPAL TUNNEL RELEASE  2004   Bilateral  . KNEE ARTHROSCOPY  2002  . RIGHT/LEFT HEART CATH AND CORONARY ANGIOGRAPHY N/A 09/15/2018   Procedure: RIGHT/LEFT HEART CATH AND CORONARY ANGIOGRAPHY;  Surgeon: Christopher Navarro, Christopher M, MD;  Location: [place] CV LAB;  Service: Cardiovascular;  Laterality: N/A;    ROS- all systems are reviewed and negatives except as per HPI above  Current Outpatient Medications  Medication Sig Dispense Refill  . acetaminophen (TYLENOL) 500 MG tablet Take 1,000 mg by mouth every 6 (six) hours as needed for mild pain or moderate pain.    .  carvedilol (COREG) 25 MG tablet TAKE 1 TABLET BY MOUTH TWICE A DAY 180 tablet 2  . dofetilide (TIKOSYN) 500 MCG capsule Take 1 capsule (500 mcg total) by mouth 2 (two) times daily. Please make overdue appt with Dr. Rayann Navarro before anymore refills. 1st attempt 180 capsule 3  . ELIQUIS 5 MG TABS tablet TAKE 1 TABLET BY MOUTH TWICE A DAY 180 tablet 1  . ENTRESTO 24-26 MG TAKE 1 TABLET BY MOUTH TWICE A DAY 180 tablet 3  . JARDIANCE 10 MG TABS tablet TAKE 1 TABLET BY MOUTH EVERY DAY 90 tablet 3  . Magnesium 250 MG TABS Take 250 mg by mouth 3 (three) times a week.    . metFORMIN (GLUCOPHAGE) 500 MG tablet Take one tablet by mouth twice daily 180 tablet 3  . Multiple Vitamin (MULTIVITAMIN WITH MINERALS) TABS tablet Take 1 tablet by mouth daily. Senior    . pantoprazole (PROTONIX) 20 MG tablet Take 1 tablet by mouth 2 (two) times daily.    . rosuvastatin (CRESTOR) 20 MG tablet TAKE 1 TABLET BY MOUTH EVERY DAY 90 tablet 1   No current facility-administered medications for this visit.    Physical Exam: Vitals:   10/09/20 1047  BP: 118/68  Pulse: (!) 52  SpO2: 96%  Weight: 223 lb (101.2 kg)  Height: 6\' 1"  (1.854 Navarro)    GEN- The patient is well appearing, alert and oriented x 3 today.   Head- normocephalic, atraumatic Eyes-  Sclera clear, conjunctiva pink Ears- hearing intact Oropharynx- clear Lungs- Clear to  ausculation bilaterally, normal work of breathing Heart- Regular rate and rhythm, no murmurs, rubs or gallops, PMI not laterally displaced GI- soft, NT, ND, + BS Extremities- no clubbing, cyanosis, or edema  Wt Readings from Last 3 Encounters:  10/09/20 223 lb (101.2 kg)  06/26/20 221 lb 9.6 oz (100.5 kg)  04/30/20 217 lb (98.4 kg)    EKG tracing ordered today is personally reviewed and shows sinus bradycardia  Assessment and Plan:  1. Persistent atrial fibrillation Doing very well Navarro ablation We may consider stopping tikosyn on return We will need to follow him closely on this  medicine to avoid toxicity  2. Tachycardia mediated CM Echo 10/02/20 reveals that EF has recovered with sinus! Reduce coreg to 12.5mg  BID Consider weaning medicines further going forward.  3. HTN Stable No change required today  4. CAD No ischemic symptoms  Risks, benefits and potential toxicities for medications prescribed and/or refilled reviewed with patient today.   Follow-up with Dr Christopher Navarro in 3 months Return to see me in 6 months   Christopher Grayer MD, [facility] 10/09/2020 11:00 AM

## 2020-10-09 NOTE — Patient Instructions (Addendum)
Medication Instructions:  Reduce your Carvedilol to 12.5 mg to times a day Your physician recommends that you continue on your current medications as directed. Please refer to the Current Medication list given to you today.  Labwork: None ordered.  Testing/Procedures: None ordered.  Follow-Up: Your physician wants you to follow-up in: 3 months with Dr. [name] & 6 months with Dr. Rayann Heman.   Any Other Special Instructions Will Be Listed Below (If Applicable).  If you need a refill on your cardiac medications before your next appointment, please call your pharmacy.

## 2020-10-18 ENCOUNTER — Telehealth: Payer: Self-pay | Admitting: Family Medicine

## 2020-10-18 NOTE — Progress Notes (Signed)
  Chronic Care Management   Outreach Note  10/18/2020 Name: Christopher Navarro MRN: [id number] DOB: 16-Feb-1951  Referred by: [name], MD Reason for referral : No chief complaint on file.   An unsuccessful telephone outreach was attempted today. The patient was referred to the pharmacist for assistance with care management and care coordination.   Follow Up Plan:   Carley Perdue [employer] Scheduler

## 2020-10-28 ENCOUNTER — Ambulatory Visit: Payer: Medicare Other | Admitting: Cardiology

## 2020-11-11 ENCOUNTER — Encounter: Payer: Self-pay | Admitting: Family Medicine

## 2020-11-11 ENCOUNTER — Other Ambulatory Visit: Payer: Self-pay

## 2020-11-11 ENCOUNTER — Ambulatory Visit (INDEPENDENT_AMBULATORY_CARE_PROVIDER_SITE_OTHER): Payer: Medicare Other | Admitting: Family Medicine

## 2020-11-11 VITALS — BP 122/60 | HR 97 | Temp 97.9°F | Wt 224.8 lb

## 2020-11-11 DIAGNOSIS — E785 Hyperlipidemia, unspecified: Secondary | ICD-10-CM | POA: Diagnosis not present

## 2020-11-11 DIAGNOSIS — I10 Essential (primary) hypertension: Secondary | ICD-10-CM | POA: Diagnosis not present

## 2020-11-11 DIAGNOSIS — I4819 Other persistent atrial fibrillation: Secondary | ICD-10-CM

## 2020-11-11 DIAGNOSIS — E1165 Type 2 diabetes mellitus with hyperglycemia: Secondary | ICD-10-CM

## 2020-11-11 LAB — POCT GLYCOSYLATED HEMOGLOBIN (HGB A1C): Hemoglobin A1C: 7.4 % — AB (ref 4.0–5.6)

## 2020-11-11 MED ORDER — METFORMIN HCL 500 MG PO TABS: ORAL_TABLET | ORAL | 3 refills | Status: DC

## 2020-11-11 NOTE — Patient Instructions (Signed)
Increase the Metformin to TWO tablets twice daily  Lose some weight  Let's plan on 3 month follow up.  Our goal is to get A1C < 7     7.4 today.

## 2020-11-11 NOTE — Progress Notes (Signed)
Established Patient Office Visit  Subjective:  Patient ID: Christopher Navarro, male    DOB: 01-13-1951  Age: 70 y.o. MRN: [id number]  CC:  Chief Complaint  Patient presents with  . Follow-up    HPI Christopher Navarro presents for medical follow-up.  He has history of A. fib, chronic systolic heart failure, hypertension, type 2 diabetes, dyslipidemia.  He is followed closely by cardiology.  Recent echo showed EF of 55 to 60%.  He had previous ablation.  Had one episode recently where he went into A. fib for about 30 minutes.  Stable at this time.  He is exercising regularly.  No recent chest pains.  He has had frequent belching and GI has scheduled EGD and colonoscopy.  Regarding diabetes not checking regularly.  No polyuria or polydipsia.  Is on Jardiance and Metformin.  Last A1c was last July and 7.0%.  He states his dietary compliance has been poor at times.  He has diabetic eye exam scheduled  Past Medical History:  Diagnosis Date  . 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 Diabetes mellitus type II   . Dyslipidemia   . [place], [place] 06/20/2009  . HYPERTENSION 06/20/2009    Past Surgical History:  Procedure Laterality Date  . ablasion  1997  . ATRIAL FIBRILLATION ABLATION N/A 03/26/2020   Procedure: ATRIAL FIBRILLATION ABLATION;  Surgeon: [name], MD;  Location: [employer] CV LAB;  Service: Cardiovascular;  Laterality: N/A;  . BACK SURGERY  2006  . CARDIOVERSION N/A 08/15/2018   Procedure: CARDIOVERSION;  Surgeon: Fay Records, MD;  Location: [facility] ENDOSCOPY;  Service: Cardiovascular;  Laterality: N/A;  . CARDIOVERSION N/A 10/20/2018   Procedure: CARDIOVERSION;  Surgeon: Buford Dresser, MD;  Location: [facility] ENDOSCOPY;  Service: Cardiovascular;  Laterality: N/A;  . CARPAL TUNNEL RELEASE  2004   Bilateral  . KNEE ARTHROSCOPY  2002  . RIGHT/LEFT HEART CATH AND CORONARY ANGIOGRAPHY N/A 09/15/2018   Procedure: RIGHT/LEFT HEART CATH AND CORONARY  ANGIOGRAPHY;  Surgeon: [name], Peter M, MD;  Location: [place] [facility] LAB;  Service: Cardiovascular;  Laterality: N/A;    Family History  Problem Relation Age of Onset  . Arthritis Other   . Hyperlipidemia Other   . Hypertension Other   . Stroke Mother     Social History   Socioeconomic History  . Marital status: Married    Spouse name: Not on file  . Number of children: Not on file  . Years of education: Not on file  . Highest education level: Not on file  Occupational History  . Not on file  Tobacco Use  . Smoking status: Former Research scientist (life sciences)  . Smokeless tobacco: Never Used  Vaping Use  . Vaping Use: Never used  Substance and Sexual Activity  . Alcohol use: No  . Drug use: No  . Sexual activity: Not on file  Other Topics Concern  . Not on file  Social History Narrative   retired   Investment banker, operational of Radio broadcast assistant Strain: Not on [employer] Insecurity: Not on file  Transportation Needs: Not on file  Physical Activity: Not on file  Stress: Not on file  Social Connections: Not on file  Intimate Partner Violence: Not on file    Outpatient Medications Prior to Visit  Medication Sig Dispense Refill  . acetaminophen (TYLENOL) 500 MG tablet Take 1,000 mg by mouth every 6 (six) hours as needed for mild pain or moderate  pain.    . carvedilol (COREG) 12.5 MG tablet Take 1 tablet (12.5 mg total) by mouth 2 (two) times daily. 180 tablet 3  . dofetilide (TIKOSYN) 500 MCG capsule Take 1 capsule (500 mcg total) by mouth 2 (two) times daily. Please make overdue appt with Dr. Rayann Heman before anymore refills. 1st attempt 180 capsule 3  . ELIQUIS 5 MG TABS tablet TAKE 1 TABLET BY MOUTH TWICE A DAY 180 tablet 1  . ENTRESTO 24-26 MG TAKE 1 TABLET BY MOUTH TWICE A DAY 180 tablet 3  . JARDIANCE 10 MG TABS tablet TAKE 1 TABLET BY MOUTH EVERY DAY 90 tablet 3  . Magnesium 250 MG TABS Take 250 mg by mouth 3 (three) times a week.    . metFORMIN (GLUCOPHAGE) 500 MG tablet Take one  tablet by mouth twice daily 180 tablet 3  . Multiple Vitamin (MULTIVITAMIN WITH MINERALS) TABS tablet Take 1 tablet by mouth daily. Senior    . pantoprazole (PROTONIX) 20 MG tablet Take 1 tablet by mouth 2 (two) times daily.    . rosuvastatin (CRESTOR) 20 MG tablet TAKE 1 TABLET BY MOUTH EVERY DAY 90 tablet 1   No facility-administered medications prior to visit.    Allergies  Allergen Reactions  . Ciprofloxacin Hives and Rash    ROS Review of Systems  Constitutional: Negative for fatigue.  Eyes: Negative for visual disturbance.  Respiratory: Negative for cough, chest tightness and shortness of breath.   Cardiovascular: Negative for chest pain, palpitations and leg swelling.  Neurological: Negative for dizziness, syncope, weakness, light-headedness and headaches.      Objective:    Physical Exam Vitals reviewed.  Constitutional:      Appearance: Normal appearance.  Cardiovascular:     Rate and Rhythm: Normal rate and regular rhythm.  Pulmonary:     Effort: Pulmonary effort is normal.     Breath sounds: Normal breath sounds.  Neurological:     Mental Status: He is alert.     BP 122/60 (BP Location: Left Arm, Patient Position: Sitting, Cuff Size: Normal)   Pulse 97   Temp 97.9 F (36.6 C) (Oral)   Wt 224 lb 12.8 oz (102 kg)   SpO2 97%   BMI 29.66 kg/m  Wt Readings from Last 3 Encounters:  11/11/20 224 lb 12.8 oz (102 kg)  10/09/20 223 lb (101.2 kg)  06/26/20 221 lb 9.6 oz (100.5 kg)     Health Maintenance Due  Topic Date Due  . OPHTHALMOLOGY EXAM  03/04/2019  . TETANUS/TDAP  06/10/2020  . HEMOGLOBIN A1C  08/25/2020    There are no preventive care reminders to display for this patient.  Lab Results  Component Value Date   TSH 1.17 07/05/2018   Lab Results  Component Value Date   WBC 6.7 03/22/2020   HGB 15.9 03/22/2020   HCT 47.4 03/22/2020   MCV 81 03/22/2020   PLT 265 03/22/2020   Lab Results  Component Value Date   NA 135 06/26/2020   K  4.5 06/26/2020   CO2 25 06/26/2020   GLUCOSE 109 (H) 06/26/2020   BUN 12 06/26/2020   CREATININE 0.94 06/26/2020   BILITOT 0.6 09/18/2019   ALKPHOS 77 09/18/2019   AST 22 09/18/2019   ALT 15 09/18/2019   PROT 7.2 09/18/2019   ALBUMIN 4.3 09/18/2019   CALCIUM 9.4 06/26/2020   ANIONGAP 6 10/28/2018   GFR 80.39 07/05/2018   Lab Results  Component Value Date   CHOL 117 09/18/2019  Lab Results  Component Value Date   HDL 53 09/18/2019   Lab Results  Component Value Date   LDLCALC 49 09/18/2019   Lab Results  Component Value Date   TRIG 70 09/18/2019   Lab Results  Component Value Date   CHOLHDL 2.2 09/18/2019   Lab Results  Component Value Date   HGBA1C 7.0 (A) 02/23/2020      Assessment & Plan:   #1 type 2 diabetes -A1c obtained today= 7.4%  -Increase Metformin 500 mg to 2 tablets twice daily  -Strongly encouraged to try to lose some weight  -Recommend 63-month follow-up and reassess A1c at that time.  If not closer to goal at that point consider further titration of Jardiance to 25 mg or possible additional medication (ie GLP-1 medication).  #2 hypertension-well-controlled -Continue Entresto and carvedilol per cardiology  #3 intermittent atrial fibrillation.  Status post previous ablation.  Appears to be in sinus rhythm at this time. -Continue Eliquis and Tikosyn per cardiology  #4 dyslipidemia treated with Crestor 20 mg daily -Is due for follow-up lipids.  We will plan to get fasting lipids with follow-up in 3 months  No orders of the defined types were placed in this encounter.   Follow-up: No follow-ups on file.    Carolann Littler, MD

## 2020-12-03 LAB — HM DIABETES EYE EXAM

## 2020-12-12 ENCOUNTER — Encounter: Payer: Self-pay | Admitting: Family Medicine

## 2021-01-02 NOTE — Progress Notes (Deleted)
PCP:  [name], MD Primary Cardiologist: Peter [name], MD Electrophysiologist: [name], MD   Christopher Navarro is a 70 y.o. male seen today for [name], MD for acute visit due to recurrent Atrial fibrillation.  Since last being seen in our clinic the patient reports doing ***.  he denies chest pain, palpitations, dyspnea, PND, orthopnea, nausea, vomiting, dizziness, syncope, edema, weight gain, or early satiety.  Past Medical History:  Diagnosis Date  . 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 Diabetes mellitus type II   . Dyslipidemia   . [place], [place] 06/20/2009  . HYPERTENSION 06/20/2009   Past Surgical History:  Procedure Laterality Date  . ablasion  1997  . ATRIAL FIBRILLATION ABLATION N/A 03/26/2020   Procedure: ATRIAL FIBRILLATION ABLATION;  Surgeon: [name], MD;  Location: [facility];  Service: Cardiovascular;  Laterality: N/A;  . BACK SURGERY  2006  . CARDIOVERSION N/A 08/15/2018   Procedure: CARDIOVERSION;  Surgeon: Fay Records, MD;  Location: [facility] ENDOSCOPY;  Service: Cardiovascular;  Laterality: N/A;  . CARDIOVERSION N/A 10/20/2018   Procedure: CARDIOVERSION;  Surgeon: Buford Dresser, MD;  Location: [facility] ENDOSCOPY;  Service: Cardiovascular;  Laterality: N/A;  . CARPAL TUNNEL RELEASE  2004   Bilateral  . KNEE ARTHROSCOPY  2002  . RIGHT/LEFT HEART CATH AND CORONARY ANGIOGRAPHY N/A 09/15/2018   Procedure: RIGHT/LEFT HEART CATH AND CORONARY ANGIOGRAPHY;  Surgeon: [name], Peter M, MD;  Location: [facility];  Service: Cardiovascular;  Laterality: N/A;    Current Outpatient Medications  Medication Sig Dispense Refill  . acetaminophen (TYLENOL) 500 MG tablet Take 1,000 mg by mouth every 6 (six) hours as needed for mild pain or moderate pain.    . carvedilol (COREG) 12.5 MG tablet Take 1 tablet (12.5 mg total) by mouth 2 (two) times daily. 180 tablet 3  . dofetilide (TIKOSYN) 500 MCG capsule Take 1  capsule (500 mcg total) by mouth 2 (two) times daily. Please make overdue appt with Dr. Rayann Heman before anymore refills. 1st attempt 180 capsule 3  . ELIQUIS 5 MG TABS tablet TAKE 1 TABLET BY MOUTH TWICE A DAY 180 tablet 1  . ENTRESTO 24-26 MG TAKE 1 TABLET BY MOUTH TWICE A DAY 180 tablet 3  . JARDIANCE 10 MG TABS tablet TAKE 1 TABLET BY MOUTH EVERY DAY 90 tablet 3  . Magnesium 250 MG TABS Take 250 mg by mouth 3 (three) times a week.    . metFORMIN (GLUCOPHAGE) 500 MG tablet Take two tablets by mouth twice daily. 360 tablet 3  . Multiple Vitamin (MULTIVITAMIN WITH MINERALS) TABS tablet Take 1 tablet by mouth daily. Senior    . pantoprazole (PROTONIX) 20 MG tablet Take 1 tablet by mouth 2 (two) times daily.    . rosuvastatin (CRESTOR) 20 MG tablet TAKE 1 TABLET BY MOUTH EVERY DAY 90 tablet 1   No current facility-administered medications for this visit.    Allergies  Allergen Reactions  . Ciprofloxacin Hives and Rash    Social History   Socioeconomic History  . Marital status: Married    Spouse name: Not on file  . Number of children: Not on file  . Years of education: Not on file  . Highest education level: Not on file  Occupational History  . Not on file  Tobacco Use  . Smoking status: Former Research scientist (life sciences)  . Smokeless tobacco: Never Used  Vaping Use  . Vaping Use: Never used  Substance and Sexual Activity  . Alcohol use: No  . Drug use: No  . Sexual activity: Not on file  Other Topics Concern  . Not on file  Social History Narrative   retired   Investment banker, operational of Radio broadcast assistant Strain: Not on file  Food Insecurity: Not on file  Transportation Needs: Not on file  Physical Activity: Not on file  Stress: Not on file  Social Connections: Not on file  Intimate Partner Violence: Not on file     Review of Systems: General: No chills, fever, night sweats or weight changes  Cardiovascular:  No chest pain, dyspnea on exertion, edema, orthopnea, palpitations,  paroxysmal nocturnal dyspnea Dermatological: No rash, lesions or masses Respiratory: No cough, dyspnea Urologic: No hematuria, dysuria Abdominal: No nausea, vomiting, diarrhea, bright red blood per rectum, melena, or hematemesis Neurologic: No visual changes, weakness, changes in mental status All other systems reviewed and are otherwise negative except as noted above.  Physical Exam: There were no vitals filed for this visit.  GEN- The patient is well appearing, alert and oriented x 3 today.   HEENT: normocephalic, atraumatic; sclera clear, conjunctiva pink; hearing intact; oropharynx clear; neck supple, no JVP Lymph- no cervical lymphadenopathy Lungs- Clear to ausculation bilaterally, normal work of breathing.  No wheezes, rales, rhonchi Heart- Regular rate and rhythm, no murmurs, rubs or gallops, PMI not laterally displaced GI- soft, non-tender, non-distended, bowel sounds present, no hepatosplenomegaly Extremities- no clubbing, cyanosis, or edema; DP/PT/radial pulses 2+ bilaterally MS- no significant deformity or atrophy Skin- warm and dry, no rash or lesion Psych- euthymic mood, full affect Neuro- strength and sensation are intact  EKG is ordered. Personal review of EKG from today shows ***  Additional studies reviewed include: Dr. Bonita Quin previous note  Assessment and Plan:  1. Persistent atrial fibrillation Has been doing well s/p ablation 03/2020 Continue Tikosyn 500 mcg BID  2. Secondary Hypercoagulable State (ICD10:  D68.69) The patient is at high risk of stroke/thromboembolism with CHA2DS2VASC of at least 5.     3. Tachycardia mediated CM Echo 10/02/20 showed EF has resolved with sinus Continue coreg 12.5 mg BID Continue Entresto 24-26 mg BID    Shirley Friar, [place]  01/02/21 4:39 PM

## 2021-01-03 ENCOUNTER — Ambulatory Visit: Payer: Medicare Other | Admitting: Student

## 2021-01-06 NOTE — Progress Notes (Signed)
Cardiology Office Note Date:  01/07/2021  Patient ID:  Christopher Navarro, Christopher Navarro Aug 07, 1951, MRN [id number] PCP:  Christopher Post, MD  Cardiologist:  Dr. [name] Electrophysiologist: Dr. Rayann Navarro   Chief Complaint: symptoms of AF  History of Present Illness: Christopher Navarro is a 70 y.o. male with history of HTN, DM,  Obesity, CAD, CM (felt to be out pf proportion to his CAD, suspect 2/2 AFib/tachycardia), and persistent AFib.  He comes in today to be seen for Dr. Rayann Navarro, last seen by him 10/09/20, was doing well Navarro ablation and mentioned possibly stopping tikosyn at his f/u visit.  His coreg was reduced and discussed further titration of his meds with normalization of his EF in SR.  Planned for 60mo viist.  TODAY Up to about 1 month ago he had been doing quite well, started to develop bloating, abdominal fullness, was started on protonix without much help, saw GI had an EGD and colonoscopy without abnormal findings reported to him, and no real recommendations from GI otherwise. He self started a probiotic with hopes this would help. Initially the bloating was quite uncomfortable, and when he would lay down felt like it was pushing up towards his chest and rising up to his mid chest and was uncomfortable. A couple weeks ago started getting wooken with Afib usually about 02:00 lasting 101min-an hour or so, then last week Monday and Tuesday had a couople daytime episodes.  Generally rates 120's-140's, very uncomfortable making him feel winded and afterwards very worn out.  He is a retired Audiological scientist, notes otherwise that his HR is good, no symptoms of bradycardia. He is very active, generally to the gym about 4days a week both some weight training and cardia with excellent exertional capacity and no CP, SOB, DOE.  No bleeding or signs of bleeding  He has not had any Afib in a week, and his belly symptoms seem to be settling.   Afib hx Diagnosed 2019 PVI ablation 03/26/2020  AAD Hx Tikosyn started  March 2020 >> is current   Past Medical History:  Diagnosis Date  . 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 Diabetes mellitus type II   . Dyslipidemia   . [place], [place] 06/20/2009  . HYPERTENSION 06/20/2009    Past Surgical History:  Procedure Laterality Date  . ablasion  1997  . ATRIAL FIBRILLATION ABLATION N/A 03/26/2020   Procedure: ATRIAL FIBRILLATION ABLATION;  Surgeon: [name], MD;  Location: [facility];  Service: Cardiovascular;  Laterality: N/A;  . BACK SURGERY  2006  . CARDIOVERSION N/A 08/15/2018   Procedure: CARDIOVERSION;  Surgeon: Fay Records, MD;  Location: [facility] ENDOSCOPY;  Service: Cardiovascular;  Laterality: N/A;  . CARDIOVERSION N/A 10/20/2018   Procedure: CARDIOVERSION;  Surgeon: Buford Dresser, MD;  Location: [facility] ENDOSCOPY;  Service: Cardiovascular;  Laterality: N/A;  . CARPAL TUNNEL RELEASE  2004   Bilateral  . KNEE ARTHROSCOPY  2002  . RIGHT/LEFT HEART CATH AND CORONARY ANGIOGRAPHY N/A 09/15/2018   Procedure: RIGHT/LEFT HEART CATH AND CORONARY ANGIOGRAPHY;  Surgeon: Christopher Navarro, Peter M, MD;  Location: [facility];  Service: Cardiovascular;  Laterality: N/A;    Current Outpatient Medications  Medication Sig Dispense Refill  . acetaminophen (TYLENOL) 500 MG tablet Take 1,000 mg by mouth every 6 (six) hours as needed for mild pain or moderate pain.    . carvedilol (COREG) 12.5 MG tablet Take 1 tablet (12.5 mg total) by mouth 2 (two)  times daily. 180 tablet 3  . dofetilide (TIKOSYN) 500 MCG capsule Take 1 capsule (500 mcg total) by mouth 2 (two) times daily. Please make overdue appt with Dr. Rayann Navarro before anymore refills. 1st attempt 180 capsule 3  . ELIQUIS 5 MG TABS tablet TAKE 1 TABLET BY MOUTH TWICE A DAY 180 tablet 1  . ENTRESTO 24-26 MG TAKE 1 TABLET BY MOUTH TWICE A DAY 180 tablet 3  . JARDIANCE 10 MG TABS tablet TAKE 1 TABLET BY MOUTH EVERY DAY 90 tablet 3  . Magnesium 250 MG TABS Take 250 mg by mouth  3 (three) times a week.    . metFORMIN (GLUCOPHAGE) 500 MG tablet Take two tablets by mouth twice daily. 360 tablet 3  . Multiple Vitamin (MULTIVITAMIN WITH MINERALS) TABS tablet Take 1 tablet by mouth daily. Senior    . pantoprazole (PROTONIX) 20 MG tablet Take 1 tablet by mouth 2 (two) times daily.    . rosuvastatin (CRESTOR) 20 MG tablet TAKE 1 TABLET BY MOUTH EVERY DAY 90 tablet 1   No current facility-administered medications for this visit.    Allergies:   Ciprofloxacin   Social History:  The patient  reports that he has quit smoking. He has never used smokeless tobacco. He reports that he does not drink alcohol and does not use drugs.   Family History:  The patient's family history includes Arthritis in an other family member; Hyperlipidemia in an other family member; Hypertension in an other family member; Stroke in his mother.  ROS:  Please see the history of present illness.    All other systems are reviewed and otherwise negative.   PHYSICAL EXAM:  VS:  BP 110/68   Pulse (!) 54   Ht 6\' 1"  (1.854 m)   Wt 217 lb (98.4 kg)   SpO2 99%   BMI 28.63 kg/m  BMI: Body mass index is 28.63 kg/m. Well nourished, well developed, in no acute distress HEENT: normocephalic, atraumatic Neck: no JVD, carotid bruits or masses Cardiac:  RRR; no significant murmurs, no rubs, or gallops Lungs:  CTA b/l, no wheezing, rhonchi or rales Abd: soft, nontender MS: no deformity or atrophy Ext:  no edema Skin: warm and dry, no rash Neuro:  No gross deficits appreciated Psych: euthymic mood, full affect    EKG:  Done today and reviewed by myself shows  SB 54bpm, QT 441ms, Qtc 421  10/02/2020: TTE IMPRESSIONS  1. Left ventricular ejection fraction, by estimation, is 55 to 60%. The  left ventricle has normal function. The left ventricle has no regional  wall motion abnormalities.  2. E/A ratio >2.2 which would be consistent with restrictive filling,  however, tissue dopplers normal and no  other parameters suggestive of  restrictive process. Suspect impaired relaxation without restriction.  3. Right ventricular systolic function is normal. The right ventricular  size is normal.  4. The mitral valve is normal in structure. Trivial mitral valve  regurgitation.  5. The aortic valve is tricuspid. Aortic valve regurgitation is not  visualized. No aortic stenosis is present.  6. The inferior vena cava is normal in size with greater than 50%  respiratory variability, suggesting right atrial pressure of 3 mmHg.    03/26/2020: EPS/Ablation CONCLUSIONS: 1. Sinus rhythm upon presentation.   2. Intracardiac echo reveals a moderate sized left atrium with four separate pulmonary veins without evidence of pulmonary vein stenosis. 3. Successful electrical isolation and anatomical encircling of all four pulmonary veins with radiofrequency current.  A WACA approach  was used 3. Additional left atrial ablation was performed with a standard box lesion created along the posterior wall of the left atrium 4. Atrial fibrillation successfully cardioverted to sinus rhythm. 5. No early apparent complications.   12/16/219: TTE Study Conclusions - Left ventricle: The cavity size was normal. There was mild concentric hypertrophy. Systolic function was mildly to moderately reduced. The estimated ejection fraction was in the range of 40% to 45%. Diffuse hypokinesis. Features are consistent with a pseudonormal left ventricular filling pattern, with concomitant abnormal relaxation and increased filling pressure (grade 2 diastolic dysfunction). Doppler parameters are consistent with elevated ventricular end-diastolic filling pressure. - Mitral valve: There was mild regurgitation. - Left atrium: The atrium was moderately dilated. - Right ventricle: The cavity size was normal. Wall thickness was normal. Systolic function was normal. - Right atrium: The atrium was normal in size. -  Tricuspid valve: There was no regurgitation. - Pericardium, extracardiac: There was no pericardial effusion.   09/15/2018: LHC  Ost 1st Diag lesion is 99% stenosed.  Ost Cx to Dist Cx lesion is 25% stenosed.  There is moderate left ventricular systolic dysfunction.  The left ventricular ejection fraction is 35-45% by visual estimate.  LV end diastolic pressure is mildly elevated.  Hemodynamic findings consistent with mild pulmonary hypertension.  1. Left dominant circulation 2. 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 Global LV dysfunction. EF 40%. 4. Mildly elevated LV filling pressures 5. Mild pulmonary venous HTN 6. Low cardiac output.   Recent Labs: 03/22/2020: Hemoglobin 15.9; Platelets 265 06/26/2020: BUN 12; Creatinine, Ser 0.94; Magnesium 2.1; Potassium 4.5; Sodium 135  No results found for requested labs within last 8760 hours.   CrCl cannot be calculated (Patient's most recent lab result is older than the maximum 21 days allowed.).   Wt Readings from Last 3 Encounters:  01/07/21 217 lb (98.4 kg)  11/11/20 224 lb 12.8 oz (102 kg)  10/09/20 223 lb (101.2 kg)     Other studies reviewed: Additional studies/records reviewed today include: summarized above  ASSESSMENT AND PLAN:  1. Paroxysmal AFib     CHA2DS2Vasc is 5, on Eliquis,  appropriately dosed     Tikosyn, QTc is OK     Labs today  discussed strategies for Afib He is bradycardic and had similar HRs on the 25mg  BID dosing of his Coreg. Discussed initially 25mg  PM and 12.5 MG AM, or an extra 12.5mg  PRN for episodes. 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   He sees Dr. Rayann Navarro in Sept, if he continues to have break  through AF maybe a repeat ablation could be considered   2. HTN     Looks good     He will monitor for low BP as well back on the increased coreg dose  3. CAD     No anginal sounding symptoms     On BB, statin, no ASA w/Eliquis  4. NICM     Suspect tachy-mediated and recovered in SR     No symptoms or exam findings of volume OL     On BB/entresto    Disposition: F/u with Dr. [name] as scheduled, sooner if needed  Current medicines are reviewed at length with  the patient today.  The patient did not have any concerns regarding medicines.  Venetia Night, PA-C 01/07/2021 9:36 AM     [address] [place] [address] [phone] (office)  [phone] (fax)

## 2021-01-07 ENCOUNTER — Encounter: Payer: Self-pay | Admitting: Physician Assistant

## 2021-01-07 ENCOUNTER — Ambulatory Visit (INDEPENDENT_AMBULATORY_CARE_PROVIDER_SITE_OTHER): Payer: Medicare Other | Admitting: Physician Assistant

## 2021-01-07 VITALS — BP 110/68 | HR 54 | Ht 73.0 in | Wt 217.0 lb

## 2021-01-07 DIAGNOSIS — I2510 Atherosclerotic heart disease of native coronary artery without angina pectoris: Secondary | ICD-10-CM

## 2021-01-07 DIAGNOSIS — Z5181 Encounter for therapeutic drug level monitoring: Secondary | ICD-10-CM | POA: Diagnosis not present

## 2021-01-07 DIAGNOSIS — I428 Other cardiomyopathies: Secondary | ICD-10-CM

## 2021-01-07 DIAGNOSIS — I480 Paroxysmal atrial fibrillation: Secondary | ICD-10-CM | POA: Diagnosis not present

## 2021-01-07 DIAGNOSIS — Z79899 Other long term (current) drug therapy: Secondary | ICD-10-CM | POA: Diagnosis not present

## 2021-01-07 LAB — BASIC METABOLIC PANEL
BUN/Creatinine Ratio: 8 — ABNORMAL LOW (ref 10–24)
BUN: 7 mg/dL — ABNORMAL LOW (ref 8–27)
CO2: 25 mmol/L (ref 20–29)
Calcium: 9.4 mg/dL (ref 8.6–10.2)
Chloride: 101 mmol/L (ref 96–106)
Creatinine, Ser: 0.87 mg/dL (ref 0.76–1.27)
Glucose: 97 mg/dL (ref 65–99)
Potassium: 4.5 mmol/L (ref 3.5–5.2)
Sodium: 139 mmol/L (ref 134–144)
eGFR: 92 mL/min/{1.73_m2} (ref >59)

## 2021-01-07 LAB — CBC
Hematocrit: 48.9 % (ref 37.5–51.0)
Hemoglobin: 15.7 g/dL (ref 13.0–17.7)
MCH: 26.2 pg — ABNORMAL LOW (ref 26.6–33.0)
MCHC: 32.1 g/dL (ref 31.5–35.7)
MCV: 82 fL (ref 79–97)
Platelets: 340 10*3/uL (ref 150–450)
RBC: 5.99 x10E6/uL — ABNORMAL HIGH (ref 4.14–5.80)
RDW: 13.9 % (ref 11.6–15.4)
WBC: 7.2 10*3/uL (ref 3.4–10.8)

## 2021-01-07 LAB — MAGNESIUM: Magnesium: 2.2 mg/dL (ref 1.6–2.3)

## 2021-01-07 MED ORDER — CARVEDILOL 25 MG PO TABS: 25.0000 mg | ORAL_TABLET | Freq: Two times a day (BID) | ORAL | 1 refills | Status: DC

## 2021-01-07 MED ORDER — DOFETILIDE 500 MCG PO CAPS: 500.0000 ug | ORAL_CAPSULE | Freq: Two times a day (BID) | ORAL | 3 refills | Status: DC

## 2021-01-07 NOTE — Patient Instructions (Addendum)
Medication Instructions:   START TAKING  CARVEDILOL( COREG ) 25 MG TWICE A DAY   *If you need a refill on your cardiac medications before your next appointment, please call your pharmacy*   Lab Work: BMET MAG AND CBC TODAY    If you have labs (blood work) drawn today and your tests are completely normal, you will receive your results only by: Marland Kitchen MyChart Message (if you have MyChart) OR . A paper copy in the mail If you have any lab test that is abnormal or we need to change your treatment, we will call you to review the results.   Testing/Procedures: NONE ORDERED  TODAY   Follow-Up: At [facility], you and your health needs are our priority.  As part of our continuing mission to provide you with exceptional heart care, we have created designated Provider Care Teams.  These Care Teams include your primary Cardiologist (physician) and Advanced Practice Providers (APPs -  Physician Assistants and Nurse Practitioners) who all work together to provide you with the care you need, when you need it.  We recommend signing up for the patient portal called "MyChart".  Sign up information is provided on this After Visit Summary.  MyChart is used to connect with patients for Virtual Visits (Telemedicine).  Patients are able to view lab/test results, encounter notes, upcoming appointments, etc.  Non-urgent messages can be sent to your provider as well.   To learn more about what you can do with MyChart, go to [email or web address].    Your next appointment:   AS SCHEDULED   Other Instructions

## 2021-01-13 ENCOUNTER — Other Ambulatory Visit: Payer: Self-pay

## 2021-01-14 ENCOUNTER — Ambulatory Visit (INDEPENDENT_AMBULATORY_CARE_PROVIDER_SITE_OTHER): Payer: Medicare Other | Admitting: Family Medicine

## 2021-01-14 ENCOUNTER — Encounter: Payer: Self-pay | Admitting: Family Medicine

## 2021-01-14 VITALS — BP 120/70 | HR 53 | Temp 97.8°F | Wt 216.7 lb

## 2021-01-14 DIAGNOSIS — R634 Abnormal weight loss: Secondary | ICD-10-CM | POA: Diagnosis not present

## 2021-01-14 DIAGNOSIS — R140 Abdominal distension (gaseous): Secondary | ICD-10-CM

## 2021-01-14 DIAGNOSIS — R1084 Generalized abdominal pain: Secondary | ICD-10-CM

## 2021-01-14 NOTE — Progress Notes (Signed)
Established Patient Office Visit  Subjective:  Patient ID: Christopher Navarro, male    DOB: [birth year]  Age: 70 y.o. MRN: [id number]  CC:  Chief Complaint  Patient presents with  . Abdominal Pain    Bloating, slight abdominal pain, x 3 months, getting worse, used pantoprazole but did not seem to help     HPI Christopher Navarro presents for somewhat diffuse abdominal bloating and diffuse abdominal pain for 3 months.  He actually saw his gastroenterologist and states about a month ago he had EGD and colonoscopy.  EGD was unremarkable.  No strictures or masses.  He states he has some occasional sensation of food hanging up around his mid esophagus.  Frequent belching and bloating symptoms at night.  He recently started probiotic about a month ago but this has helped only slightly.  He has lost 8 pounds and states his appetite is down somewhat though he has made some intentional changes with reduced sugar intake.  He is also having symptoms of loose stools and occasional diarrhea actually about every other day on average.  No bloody stools.  He had recent CBC and basic metabolic panel which were normal.  He is concerned about possible gallstones though he denies any specific right upper quadrant pain.  Ultrasound 5 years ago which was unremarkable for any gallbladder abnormalities.  Past Medical History:  Diagnosis Date  . 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 Diabetes mellitus type II   . Dyslipidemia   . [place], [place] 06/20/2009  . HYPERTENSION 06/20/2009    Past Surgical History:  Procedure Laterality Date  . ablasion  1997  . ATRIAL FIBRILLATION ABLATION N/A 03/26/2020   Procedure: ATRIAL FIBRILLATION ABLATION;  Surgeon: [name], MD;  Location: [facility];  Service: Cardiovascular;  Laterality: N/A;  . BACK SURGERY  2006  . CARDIOVERSION N/A 08/15/2018   Procedure: CARDIOVERSION;  Surgeon: Fay Records, MD;  Location: [facility];  Service:  Cardiovascular;  Laterality: N/A;  . CARDIOVERSION N/A 10/20/2018   Procedure: CARDIOVERSION;  Surgeon: Buford Dresser, MD;  Location: [facility] ENDOSCOPY;  Service: Cardiovascular;  Laterality: N/A;  . CARPAL TUNNEL RELEASE  2004   Bilateral  . KNEE ARTHROSCOPY  2002  . RIGHT/LEFT HEART CATH AND CORONARY ANGIOGRAPHY N/A 09/15/2018   Procedure: RIGHT/LEFT HEART CATH AND CORONARY ANGIOGRAPHY;  Surgeon: [name], Peter M, MD;  Location: [facility];  Service: Cardiovascular;  Laterality: N/A;    Family History  Problem Relation Age of Onset  . Arthritis Other   . Hyperlipidemia Other   . Hypertension Other   . Stroke Mother     Social History   Socioeconomic History  . Marital status: Married    Spouse name: Not on file  . Number of children: Not on file  . Years of education: Not on file  . Highest education level: Not on file  Occupational History  . Not on file  Tobacco Use  . Smoking status: Former Research scientist (life sciences)  . Smokeless tobacco: Never Used  Vaping Use  . Vaping Use: Never used  Substance and Sexual Activity  . Alcohol use: No  . Drug use: No  . Sexual activity: Not on file  Other Topics Concern  . Not on file  Social History Narrative   retired   Investment banker, operational of Radio broadcast assistant Strain: Not on [employer] Insecurity: Not on file  Transportation Needs: Not on file  Physical Activity: Not on file  Stress: Not on file  Social Connections: Not on file  Intimate Partner Violence: Not on file    Outpatient Medications Prior to Visit  Medication Sig Dispense Refill  . acetaminophen (TYLENOL) 500 MG tablet Take 1,000 mg by mouth every 6 (six) hours as needed for mild pain or moderate pain.    . carvedilol (COREG) 25 MG tablet Take 1 tablet (25 mg total) by mouth 2 (two) times daily. 180 tablet 1  . dofetilide (TIKOSYN) 500 MCG capsule Take 1 capsule (500 mcg total) by mouth 2 (two) times daily. Please make overdue appt with Dr. Rayann Heman before  anymore refills. 1st attempt 180 capsule 3  . ELIQUIS 5 MG TABS tablet TAKE 1 TABLET BY MOUTH TWICE A DAY 180 tablet 1  . ENTRESTO 24-26 MG TAKE 1 TABLET BY MOUTH TWICE A DAY 180 tablet 3  . JARDIANCE 10 MG TABS tablet TAKE 1 TABLET BY MOUTH EVERY DAY 90 tablet 3  . Magnesium 250 MG TABS Take 250 mg by mouth 3 (three) times a week.    . metFORMIN (GLUCOPHAGE) 500 MG tablet Take two tablets by mouth twice daily. 360 tablet 3  . Multiple Vitamin (MULTIVITAMIN WITH MINERALS) TABS tablet Take 1 tablet by mouth daily. Senior    . pantoprazole (PROTONIX) 20 MG tablet Take 1 tablet by mouth 2 (two) times daily.    . rosuvastatin (CRESTOR) 20 MG tablet TAKE 1 TABLET BY MOUTH EVERY DAY 90 tablet 1   No facility-administered medications prior to visit.    Allergies  Allergen Reactions  . Ciprofloxacin Hives and Rash    ROS Review of Systems  Constitutional: Positive for appetite change and unexpected weight change. Negative for chills and fever.  Respiratory: Negative for cough and shortness of breath.   Cardiovascular: Negative for chest pain.  Gastrointestinal: Positive for abdominal pain and diarrhea. Negative for blood in stool, nausea and vomiting.  Genitourinary: Negative for dysuria.  Neurological: Negative for dizziness.      Objective:    Physical Exam Vitals reviewed.  Constitutional:      Appearance: He is well-developed.  Cardiovascular:     Rate and Rhythm: Normal rate.  Pulmonary:     Effort: Pulmonary effort is normal.     Breath sounds: Normal breath sounds.  Abdominal:     General: Abdomen is flat. Bowel sounds are normal.     Palpations: Abdomen is soft. There is no hepatomegaly, splenomegaly, mass or pulsatile mass.     Tenderness: There is no abdominal tenderness.     Hernia: No hernia is present.  Neurological:     Mental Status: He is alert.     BP 120/70 (BP Location: Left Arm, Patient Position: Sitting, Cuff Size: Normal)   Pulse (!) 53   Temp 97.8 F  (36.6 C) (Oral)   Wt 216 lb 11.2 oz (98.3 kg)   SpO2 98%   BMI 28.59 kg/m  Wt Readings from Last 3 Encounters:  01/14/21 216 lb 11.2 oz (98.3 kg)  01/07/21 217 lb (98.4 kg)  11/11/20 224 lb 12.8 oz (102 kg)     Health Maintenance Due  Topic Date Due  . Pneumococcal Vaccine 75-85 Years old (1 of 4 - PCV13) Never done  . Zoster Vaccines- Shingrix (1 of 2) Never done  . TETANUS/TDAP  06/10/2020  . COVID-19 Vaccine (4 - Booster for Moderna series) 09/12/2020    There are no preventive care reminders to display for this  patient.  Lab Results  Component Value Date   TSH 1.17 07/05/2018   Lab Results  Component Value Date   WBC 7.2 01/07/2021   HGB 15.7 01/07/2021   HCT 48.9 01/07/2021   MCV 82 01/07/2021   PLT 340 01/07/2021   Lab Results  Component Value Date   NA 139 01/07/2021   K 4.5 01/07/2021   CO2 25 01/07/2021   GLUCOSE 97 01/07/2021   BUN 7 (L) 01/07/2021   CREATININE 0.87 01/07/2021   BILITOT 0.6 09/18/2019   ALKPHOS 77 09/18/2019   AST 22 09/18/2019   ALT 15 09/18/2019   PROT 7.2 09/18/2019   ALBUMIN 4.3 09/18/2019   CALCIUM 9.4 01/07/2021   ANIONGAP 6 10/28/2018   EGFR 92 01/07/2021   GFR 80.39 07/05/2018   Lab Results  Component Value Date   CHOL 117 09/18/2019   Lab Results  Component Value Date   HDL 53 09/18/2019   Lab Results  Component Value Date   LDLCALC 49 09/18/2019   Lab Results  Component Value Date   TRIG 70 09/18/2019   Lab Results  Component Value Date   CHOLHDL 2.2 09/18/2019   Lab Results  Component Value Date   HGBA1C 7.4 (A) 11/11/2020      Assessment & Plan:   Abdominal bloating and diffuse abdominal pain.  Recent EGD unremarkable.  He has had some weight loss of 8 pounds since early April but not clear how much of this is intentional.  Also having frequent loose stools.  Recent labs unremarkable.  -Doubt gallstones but patient would like to pursue ultrasound to further assess.  This seems reasonable in view  of his weight loss. -If ultrasound unremarkable and diarrhea progressive consider other etiologies such as SIBO -also discussed low FODMAP diet.   No orders of the defined types were placed in this encounter.   Follow-up: No follow-ups on file.    Carolann Littler, MD

## 2021-01-14 NOTE — Patient Instructions (Signed)
Low-FODMAP Eating Plan  FODMAP stands for fermentable oligosaccharides, disaccharides, monosaccharides, and polyols. These are sugars that are hard for some people to digest. A low-FODMAP eating plan may help some people who have irritable bowel syndrome (IBS) and certain other bowel (intestinal) diseases to manage their symptoms. This meal plan can be complicated to follow. Work with a diet and nutrition specialist (dietitian) to make a low-FODMAP eating plan that is right for you. A dietitian can help make sure that you get enough nutrition from this diet. What are tips for following this plan? Reading food labels  Check labels for hidden FODMAPs such as: ? High-fructose syrup. ? Honey. ? Agave. ? Natural fruit flavors. ? Onion or garlic powder.  Choose low-FODMAP foods that contain 3-4 grams of fiber per serving.  Check food labels for serving sizes. Eat only one serving at a time to make sure FODMAP levels stay low. Shopping  Shop with a list of foods that are recommended on this diet and make a meal plan. Meal planning  Follow a low-FODMAP eating plan for up to 6 weeks, or as told by your health care provider or dietitian.  To follow the eating plan: 1. Eliminate high-FODMAP foods from your diet completely. Choose only low-FODMAP foods to eat. You will do this for 2-6 weeks. 2. Gradually reintroduce high-FODMAP foods into your diet one at a time. Most people should wait a few days before introducing the next new high-FODMAP food into their meal plan. Your dietitian can recommend how quickly you may reintroduce foods. 3. Keep a daily record of what and how much you eat and drink. Make note of any symptoms that you have after eating. 4. Review your daily record with a dietitian regularly to identify which foods you can eat and which foods you should avoid. General tips  Drink enough fluid each day to keep your urine pale yellow.  Avoid processed foods. These often have added sugar  and may be high in FODMAPs.  Avoid most dairy products, whole grains, and sweeteners.  Work with a dietitian to make sure you get enough fiber in your diet.  Avoid high FODMAP foods at meals to manage symptoms. Recommended foods Fruits Bananas, oranges, tangerines, lemons, limes, blueberries, raspberries, strawberries, grapes, cantaloupe, honeydew melon, kiwi, papaya, passion fruit, and pineapple. Limited amounts of dried cranberries, banana chips, and shredded coconut. Vegetables Eggplant, zucchini, cucumber, peppers, green beans, bean sprouts, lettuce, arugula, kale, Swiss chard, spinach, collard greens, bok choy, summer squash, potato, and tomato. Limited amounts of corn, carrot, and sweet potato. Green parts of scallions. Grains Gluten-free grains, such as rice, oats, buckwheat, quinoa, corn, polenta, and millet. Gluten-free pasta, bread, or cereal. Rice noodles. Corn tortillas. Meats and other proteins Unseasoned beef, pork, poultry, or fish. Eggs. Berniece Salines. Tofu (firm) and tempeh. Limited amounts of nuts and seeds, such as almonds, walnuts, [place] nuts, pecans, peanuts, nut butters, pumpkin seeds, chia seeds, and sunflower seeds. Dairy Lactose-free milk, yogurt, and kefir. Lactose-free cottage cheese and ice cream. Non-dairy milks, such as almond, coconut, hemp, and rice milk. Non-dairy yogurt. Limited amounts of goat cheese, brie, mozzarella, parmesan, swiss, and other hard cheeses. Fats and oils Butter-free spreads. Vegetable oils, such as olive, canola, and sunflower oil. Seasoning and other foods Artificial sweeteners with names that do not end in "ol," such as aspartame, saccharine, and stevia. Maple syrup, white table sugar, raw sugar, brown sugar, and molasses. Mayonnaise, soy sauce, and tamari. Fresh basil, coriander, parsley, rosemary, and thyme. Beverages Water and  mineral water. Sugar-sweetened soft drinks. Small amounts of orange juice or cranberry juice. Black and green tea.  Most dry wines. Coffee. The items listed above may not be a complete list of foods and beverages you can eat. Contact a dietitian for more information. Foods to avoid Fruits Fresh, dried, and juiced forms of apple, pear, watermelon, peach, plum, cherries, apricots, blackberries, boysenberries, figs, nectarines, and mango. Avocado. Vegetables Chicory root, artichoke, asparagus, cabbage, snow peas, Brussels sprouts, broccoli, sugar snap peas, mushrooms, celery, and cauliflower. Onions, garlic, leeks, and the white part of scallions. Grains Wheat, including kamut, durum, and semolina. Barley and bulgur. Couscous. Wheat-based cereals. Wheat noodles, bread, crackers, and pastries. Meats and other proteins Fried or fatty meat. Sausage. Cashews and pistachios. Soybeans, baked beans, black beans, chickpeas, kidney beans, fava beans, navy beans, lentils, black-eyed peas, and split peas. Dairy Milk, yogurt, ice cream, and soft cheese. Cream and sour cream. Milk-based sauces. Custard. Buttermilk. Soy milk. Seasoning and other foods Any sugar-free gum or candy. Foods that contain artificial sweeteners such as sorbitol, mannitol, isomalt, or xylitol. Foods that contain honey, high-fructose corn syrup, or agave. Bouillon, vegetable stock, beef stock, and chicken stock. Garlic and onion powder. Condiments made with onion, such as hummus, chutney, pickles, relish, salad dressing, and salsa. Tomato paste. Beverages Chicory-based drinks. Coffee substitutes. Chamomile tea. Fennel tea. Sweet or fortified wines such as port or sherry. Diet soft drinks made with isomalt, mannitol, maltitol, sorbitol, or xylitol. Apple, pear, and mango juice. Juices with high-fructose corn syrup. The items listed above may not be a complete list of foods and beverages you should avoid. Contact a dietitian for more information. Summary  FODMAP stands for fermentable oligosaccharides, disaccharides, monosaccharides, and polyols. These  are sugars that are hard for some people to digest.  A low-FODMAP eating plan is a short-term diet that helps to ease symptoms of certain bowel diseases.  The eating plan usually lasts up to 6 weeks. After that, high-FODMAP foods are reintroduced gradually and one at a time. This can help you find out which foods may be causing symptoms.  A low-FODMAP eating plan can be complicated. It is best to work with a dietitian who has experience with this type of plan. This information is not intended to replace advice given to you by your health care provider. Make sure you discuss any questions you have with your health care provider. Document Revised: 12/14/2019 Document Reviewed: 12/14/2019 Elsevier Patient Education  2021 [place].  I will set up abdominal ultrasound to further assess.

## 2021-01-24 ENCOUNTER — Ambulatory Visit
Admission: RE | Admit: 2021-01-24 | Discharge: 2021-01-24 | Disposition: A | Discharge status: Home / Self Care | Payer: Medicare Other | Source: Ambulatory Visit | Attending: Family Medicine | Admitting: Family Medicine

## 2021-01-24 DIAGNOSIS — R634 Abnormal weight loss: Secondary | ICD-10-CM

## 2021-01-24 DIAGNOSIS — R1084 Generalized abdominal pain: Secondary | ICD-10-CM

## 2021-02-07 NOTE — Progress Notes (Signed)
Date:  02/12/2021   ID:  Christopher Navarro, DOB 1950/11/19, MRN [id number]  PCP:  [name], MD  Cardiologist:  Cypher Paule [name] MD Electrophysiologist:  [name] MD  Chief Complaint: CHF   History of Present Illness:    Christopher Navarro is a 70 y.o. male who is seen for follow up Afib and CHF.   He has a history of WPW and underwent an ablation in 1997 by Dr. Caryl Comes. He was seen by Dr. Gwenlyn Found on Jul 20, 2018 for evaluation of new onset AFib. He states that prior to Thanksgiving he noted increase fatigue. As time went on he developed more fatigue, SOB, and some chest discomfort.  He was anticoagulated. Echo showed global HK with EF 40-45%. He has been on Verapamil for rate control. He was seen in the Afib clinic and after anticoagulation he underwent DCCV on 08/15/18. He unfortunately had early return of Afib.  He has a history of NIDDM, HLD, and HTN. He also has a family history of CAD with a brother having CABG.   He was seen back in Afib clinic and options for AAD therapy reviewed. Given LV dysfunction options include Tikosyn or amiodarone. Patient's family was concerned about this approach and thought he needed further evaluation of his LV dysfunction. He was seen by me for a second opinion. On his initial visit we switched his Verapamil to Coreg due to negative inotropic effects of Verapamil. His lisinopril dose was increased. He subsequently underwent [facility] on 09/15/18. 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 Mildly elevated LV filling pressures and pulmonary HTN. Low cardiac output. His Coreg dose was increased. He was switched from lisinopril to [facility] and was also started on Jardiance. Since his visit with me in February he was seen by the Afib clinic and admitted for Tikosyn loading and he had DCCV on 10/20/18. QT remained normal. Subsequent follow up Echo in July 2020 was normal.   In 2021  he developed recurrent Afib despite Tikosyn. He was significantly symptomatic with marked fatigue.  He underwent Afib ablation by Dr Rayann Heman on 03/26/20. More recently seen in EP clinic. Noted some breakthrough Afib lasting up to 45 minutes. Was kept on Tikosyn. Coreg dose increased back to 25 mg bid. He was experiencing a lot of GI bloating and discomfort at the time. Upper and lower EGD were normal Abd [place] was negative. Symptoms improved with reduction in metformin.   On visit today he states he is doing very well. No SOB. No chest pain. He has been eating healthy and has lost 17 lbs. Last A1c down to 6.2%. He does note his energy level is down.     Prior CV studies:   The following studies were reviewed today:  Echo 07/25/18: Study Conclusions   - Left ventricle: The cavity size was normal. There was mild   concentric hypertrophy. Systolic function was mildly to   moderately reduced. The estimated ejection fraction was in the   range of 40% to 45%. Diffuse hypokinesis. Features are consistent   with a pseudonormal left ventricular filling pattern, with   concomitant abnormal relaxation and increased filling pressure   (grade 2 diastolic dysfunction). Doppler parameters are   consistent with elevated ventricular end-diastolic filling   pressure. - Mitral valve: There was mild regurgitation. - Left atrium: The atrium was moderately dilated. - Right ventricle: The cavity size was normal. Wall thickness was   normal. Systolic function was normal. - Right atrium: The atrium was  normal in size. - Tricuspid valve: There was no regurgitation. - Pericardium, extracardiac: There was no pericardial effusion.   Impressions:   - No prior study available for comparison.   Cardiac cath 09/25/18:  RIGHT/LEFT HEART CATH AND CORONARY ANGIOGRAPHY  Conclusion      Ost 1st Diag lesion is 99% stenosed. Ost Cx to Dist Cx lesion is 25% stenosed. There is moderate left ventricular systolic dysfunction. The left ventricular ejection fraction is 35-45% by visual estimate. LV end diastolic pressure is  mildly elevated. Hemodynamic findings consistent with mild pulmonary hypertension.   1. Left dominant circulation 2. 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 Global LV dysfunction. EF 40%. 4. Mildly elevated LV filling pressures 5. Mild pulmonary venous HTN 6. Low cardiac output.    Plan: will optimize therapy for CHF. Increase Coreg dose to 25 mg bid as Afib rate is not well controlled. Consider switching lisinopril to Entresto. Consider adding Jardiance for DM. Once CHF medications optimized will plan Tikosyn load and conversion to NSR. OK to resume Eliquis in am.     Echo 02/22/19:  IMPRESSIONS     1. The left ventricle has normal systolic function with an ejection  fraction of 60-65%. The cavity size was normal. Left ventricular diastolic  Doppler parameters are consistent with impaired relaxation.   2. The right ventricle has normal systolic function. The cavity was  normal. There is no increase in right ventricular wall thickness.   3. The aortic valve is tricuspid. Mild thickening of the aortic valve.  Mild calcification of the aortic valve.   10/02/2020: TTE IMPRESSIONS   1. Left ventricular ejection fraction, by estimation, is 55 to 60%. The  left ventricle has normal function. The left ventricle has no regional  wall motion abnormalities.   2. E/A ratio >2.2 which would be consistent with restrictive filling,  however, tissue dopplers normal and no other parameters suggestive of  restrictive process. Suspect impaired relaxation without restriction.   3. Right ventricular systolic function is normal. The right ventricular  size is normal.   4. The mitral valve is normal in structure. Trivial mitral valve  regurgitation.   5. The aortic valve is tricuspid. Aortic valve regurgitation is not  visualized. No aortic stenosis is present.   6. The inferior vena cava is normal in size with greater than 50%  respiratory variability, suggesting right  atrial pressure of 3 mmHg.       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   Diabetes mellitus type II    Dyslipidemia    FASCIITIS, PLANTAR 06/20/2009   HYPERTENSION 06/20/2009   Past Surgical History:  Procedure Laterality Date   ablasion  1997   ATRIAL FIBRILLATION ABLATION N/A 03/26/2020   Procedure: ATRIAL FIBRILLATION ABLATION;  Surgeon: [name], MD;  Location: [place] [facility];  Service: Cardiovascular;  Laterality: N/A;   BACK SURGERY  2006   CARDIOVERSION N/A 08/15/2018   Procedure: CARDIOVERSION;  Surgeon: Fay Records, MD;  Location: [place];  Service: Cardiovascular;  Laterality: N/A;   CARDIOVERSION N/A 10/20/2018   Procedure: CARDIOVERSION;  Surgeon: Buford Dresser, MD;  Location: [place];  Service: Cardiovascular;  Laterality: N/A;   CARPAL TUNNEL RELEASE  2004   Bilateral   KNEE ARTHROSCOPY  2002   RIGHT/LEFT HEART CATH AND CORONARY ANGIOGRAPHY N/A 09/15/2018   Procedure: RIGHT/LEFT HEART CATH AND CORONARY ANGIOGRAPHY;  Surgeon: [name], Tyeisha Dinan M, MD;  Location: [place] [facility];  Service: Cardiovascular;  Laterality: N/A;     Current Meds  Medication Sig   acetaminophen (TYLENOL) 500 MG tablet Take 1,000 mg by mouth every 6 (six) hours as needed for mild pain or moderate pain.   carvedilol (COREG) 25 MG tablet Take 1 tablet (25 mg total) by mouth 2 (two) times daily.   dofetilide (TIKOSYN) 500 MCG capsule Take 1 capsule (500 mcg total) by mouth 2 (two) times daily. Please make overdue appt with Dr. Rayann Heman before anymore refills. 1st attempt   ELIQUIS 5 MG TABS tablet TAKE 1 TABLET BY MOUTH TWICE A DAY   ENTRESTO 24-26 MG TAKE 1 TABLET BY MOUTH TWICE A DAY   famotidine (PEPCID) 20 MG tablet Take 20 mg by mouth 2 (two) times daily.   JARDIANCE 10 MG TABS tablet TAKE 1 TABLET BY MOUTH EVERY DAY   Magnesium 250 MG TABS Take 250 mg by mouth 3 (three) times a week.   metFORMIN  (GLUCOPHAGE) 500 MG tablet Take two tablets by mouth twice daily.   Multiple Vitamin (MULTIVITAMIN WITH MINERALS) TABS tablet Take 1 tablet by mouth daily. Senior   pantoprazole (PROTONIX) 20 MG tablet Take 1 tablet by mouth 2 (two) times daily.   rosuvastatin (CRESTOR) 20 MG tablet TAKE 1 TABLET BY MOUTH EVERY DAY     Allergies:   Ciprofloxacin   Social History   Tobacco Use   Smoking status: Former    Pack years: 0.00   Smokeless tobacco: Never  Vaping Use   Vaping Use: Never used  Substance Use Topics   Alcohol use: No   Drug use: No     Family Hx: The patient's family history includes Arthritis in an other family member; Hyperlipidemia in an other family member; Hypertension in an other family member; Stroke in his mother.  ROS:   Please see the history of present illness.     All other systems reviewed and are negative.   Labs/Other Tests and Data Reviewed:    Recent Labs: 01/07/2021: BUN 7; Creatinine, Ser 0.87; Hemoglobin 15.7; Magnesium 2.2; Platelets 340; Potassium 4.5; Sodium 139   Recent Lipid Panel Lab Results  Component Value Date/Time   CHOL 117 09/18/2019 09:01 AM   TRIG 70 09/18/2019 09:01 AM   HDL 53 09/18/2019 09:01 AM   CHOLHDL 2.2 09/18/2019 09:01 AM   CHOLHDL 3 03/08/2018 11:12 AM   LDLCALC 49 09/18/2019 09:01 AM    Wt Readings from Last 3 Encounters:  02/12/21 206 lb 3.2 oz (93.5 kg)  02/11/21 206 lb (93.4 kg)  01/14/21 216 lb 11.2 oz (98.3 kg)    Objective:    Vital Signs:  BP 124/62   Pulse (!) 50   Ht 6\' 1"  (1.854 m)   Wt 206 lb 3.2 oz (93.5 kg)   SpO2 99%   BMI 27.20 kg/m  per patient report  GENERAL:  Well appearing WM  In NAD HEENT:  PERRL, EOMI, sclera are clear. Oropharynx is clear. NECK:  No jugular venous distention, carotid upstroke brisk and symmetric, no bruits, no thyromegaly or adenopathy LUNGS:  Clear to auscultation bilaterally CHEST:  Unremarkable HEART:  RRR,  PMI not displaced or sustained,S1 and S2 within  normal limits, no S3, no S4: no clicks, no rubs, no murmurs ABD:  Soft, nontender. BS +, no masses or bruits. No hepatomegaly, no splenomegaly EXT:  2 + pulses throughout, no edema, no cyanosis no clubbing SKIN:  Warm and dry.  No rashes NEURO:  Alert and oriented x 3. Cranial nerves II through XII intact. PSYCH:  Cognitively intact    ASSESSMENT & PLAN:    1.  Chronic systolic CHF with nonischemic cardiomyopathy. EF down to 40% before.  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 Cardiac cath results as noted. Repeat Echo after restoration of sinus rhythm and optimization of medical therapy shows normalization of LV function. He is asymptomatic. Will continue Coreg, [place], [place].   2. Atrial fibrillation persistent and symptomatic. S/p DCCV with early return of AFib. Then placed on Tikosyn  and repeat DCCV performed on 10/20/18.  Had recurrent Afib this summer and is now s/p ablation in August. Some persistent break through. Continue Tikosyn and follow up with Dr Rayann Heman.   3. Remote history of WPW s/p ablation  4. HTN controlled  5. Hypercholesterolemia. Now on Crestor. At goal.   6. DM type 2 on metformin and Jardiance. Last A1c 6.2 %.    Medication Adjustments/Labs and Tests Ordered: Current medicines are reviewed at length with the patient today.  Concerns regarding medicines are outlined above.  Tests Ordered: CBC, CMET, A1c, lipids, magnesium  Medication Changes: No orders of the defined types were placed in this encounter.   Disposition:  Follow up 6 months.  Signed, Modean Mccullum [name], MD  02/12/2021 2:43 PM    [facility]

## 2021-02-11 ENCOUNTER — Encounter: Payer: Self-pay | Admitting: Family Medicine

## 2021-02-11 ENCOUNTER — Ambulatory Visit (INDEPENDENT_AMBULATORY_CARE_PROVIDER_SITE_OTHER): Payer: Medicare Other | Admitting: Family Medicine

## 2021-02-11 ENCOUNTER — Other Ambulatory Visit: Payer: Self-pay

## 2021-02-11 VITALS — BP 112/70 | HR 56 | Temp 97.5°F | Ht 73.0 in | Wt 206.0 lb

## 2021-02-11 DIAGNOSIS — E1165 Type 2 diabetes mellitus with hyperglycemia: Secondary | ICD-10-CM

## 2021-02-11 LAB — POCT GLYCOSYLATED HEMOGLOBIN (HGB A1C): Hemoglobin A1C: 6.2 % — AB (ref 4.0–5.6)

## 2021-02-11 NOTE — Addendum Note (Signed)
Addended by: Geradine Girt D on: 02/11/2021 02:57 PM   Modules accepted: Orders

## 2021-02-11 NOTE — Patient Instructions (Signed)
A1C has improved to 6.2%  Keep up the good work.  Let's plan on 6 month follow up.

## 2021-02-11 NOTE — Progress Notes (Signed)
Established Patient Office Visit  Subjective:  Patient ID: Christopher Navarro, male    DOB: August 31, 1950  Age: 70 y.o. MRN: [id number]  CC: No chief complaint on file.   HPI Christopher Navarro presents for follow-up regarding type 2 diabetes.  Back in April his A1c was 7.4%.  We titrated his metformin to 500 mg 2 tablets twice daily.  He also has been diligent with reducing sugars and starches.  His weight is down 18 pounds.  He denies any polyuria or polydipsia.  A1c today in office is improved to 6.2%.  He does continue to have some chronic dyspepsia.  He has follow up scheduled with GI for next week.  He had recent ultrasound which showed no acute findings.  Frequent bloated feeling.  We had recommended low FODMAP diet which has helped slightly.  He has also tried eliminating lactose and gluten's without much benefit.  No recent stool changes.  He does not think his symptoms correlate necessarily with metformin.  Not having consistent diarrhea or loose stool symptoms.  He had recent EGD and colonoscopy back in May with no significant findings  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   Diabetes mellitus type II    Dyslipidemia    FASCIITIS, PLANTAR 06/20/2009   HYPERTENSION 06/20/2009    Past Surgical History:  Procedure Laterality Date   ablasion  1997   ATRIAL FIBRILLATION ABLATION N/A 03/26/2020   Procedure: ATRIAL FIBRILLATION ABLATION;  Surgeon: [name], MD;  Location: [place] [facility];  Service: Cardiovascular;  Laterality: N/A;   BACK SURGERY  2006   CARDIOVERSION N/A 08/15/2018   Procedure: CARDIOVERSION;  Surgeon: Fay Records, MD;  Location: [place];  Service: Cardiovascular;  Laterality: N/A;   CARDIOVERSION N/A 10/20/2018   Procedure: CARDIOVERSION;  Surgeon: Buford Dresser, MD;  Location: [place];  Service: Cardiovascular;  Laterality: N/A;   CARPAL TUNNEL RELEASE  2004   Bilateral   KNEE  ARTHROSCOPY  2002   RIGHT/LEFT HEART CATH AND CORONARY ANGIOGRAPHY N/A 09/15/2018   Procedure: RIGHT/LEFT HEART CATH AND CORONARY ANGIOGRAPHY;  Surgeon: [name], Peter M, MD;  Location: [place] [facility];  Service: Cardiovascular;  Laterality: N/A;    Family History  Problem Relation Age of Onset   Arthritis Other    Hyperlipidemia Other    Hypertension Other    Stroke Mother     Social History   Socioeconomic History   Marital status: Married    Spouse name: Not on file   Number of children: Not on file   Years of education: Not on file   Highest education level: Not on file  Occupational History   Not on file  Tobacco Use   Smoking status: Former    Pack years: 0.00   Smokeless tobacco: Never  Vaping Use   Vaping Use: Never used  Substance and Sexual Activity   Alcohol use: No   Drug use: No   Sexual activity: Not on file  Other Topics Concern   Not on file  Social History Narrative   retired   Investment banker, operational of Radio broadcast assistant Strain: Not on file  Food Insecurity: Not on file  Transportation Needs: Not on file  Physical Activity: Not on file  Stress: Not on file  Social Connections: Not on file  Intimate Partner Violence: Not on file    Outpatient Medications Prior to Visit  Medication Sig Dispense Refill   acetaminophen (TYLENOL) 500 MG tablet Take 1,000 mg by mouth every 6 (six) hours as needed for mild pain or moderate pain.     carvedilol (COREG) 25 MG tablet Take 1 tablet (25 mg total) by mouth 2 (two) times daily. 180 tablet 1   dofetilide (TIKOSYN) 500 MCG capsule Take 1 capsule (500 mcg total) by mouth 2 (two) times daily. Please make overdue appt with Dr. Rayann Heman before anymore refills. 1st attempt 180 capsule 3   ELIQUIS 5 MG TABS tablet TAKE 1 TABLET BY MOUTH TWICE A DAY 180 tablet 1   ENTRESTO 24-26 MG TAKE 1 TABLET BY MOUTH TWICE A DAY 180 tablet 3   JARDIANCE 10 MG TABS tablet TAKE 1 TABLET BY MOUTH EVERY DAY 90 tablet 3    Magnesium 250 MG TABS Take 250 mg by mouth 3 (three) times a week.     metFORMIN (GLUCOPHAGE) 500 MG tablet Take two tablets by mouth twice daily. 360 tablet 3   Multiple Vitamin (MULTIVITAMIN WITH MINERALS) TABS tablet Take 1 tablet by mouth daily. Senior     pantoprazole (PROTONIX) 20 MG tablet Take 1 tablet by mouth 2 (two) times daily.     rosuvastatin (CRESTOR) 20 MG tablet TAKE 1 TABLET BY MOUTH EVERY DAY 90 tablet 1   No facility-administered medications prior to visit.    Allergies  Allergen Reactions   Ciprofloxacin Hives and Rash    ROS Review of Systems  Constitutional:  Negative for chills and fever.  Gastrointestinal:  Negative for blood in stool, nausea and vomiting.  Endocrine: Negative for polydipsia and polyuria.     Objective:    Physical Exam Vitals reviewed.  Constitutional:      Appearance: Normal appearance.  Cardiovascular:     Rate and Rhythm: Normal rate and regular rhythm.  Pulmonary:     Effort: Pulmonary effort is normal.     Breath sounds: Normal breath sounds.  Neurological:     Mental Status: He is alert.    BP 112/70 (BP Location: Right Arm, Patient Position: Sitting, Cuff Size: Normal)   Pulse (!) 56   Temp (!) 97.5 F (36.4 C) (Oral)   Ht $R'6\' 1"'LS$  (1.854 m)   Wt 206 lb (93.4 kg)   SpO2 98%   BMI 27.18 kg/m  Wt Readings from Last 3 Encounters:  02/11/21 206 lb (93.4 kg)  01/14/21 216 lb 11.2 oz (98.3 kg)  01/07/21 217 lb (98.4 kg)     There are no preventive care reminders to display for this patient.  There are no preventive care reminders to display for this patient.  Lab Results  Component Value Date   TSH 1.17 07/05/2018   Lab Results  Component Value Date   WBC 7.2 01/07/2021   HGB 15.7 01/07/2021   HCT 48.9 01/07/2021   MCV 82 01/07/2021   PLT 340 01/07/2021   Lab Results  Component Value Date   NA 139 01/07/2021   K 4.5 01/07/2021   CO2 25 01/07/2021   GLUCOSE 97 01/07/2021   BUN 7 (L) 01/07/2021    CREATININE 0.87 01/07/2021   BILITOT 0.6 09/18/2019   ALKPHOS 77 09/18/2019   AST 22 09/18/2019   ALT 15 09/18/2019   PROT 7.2 09/18/2019   ALBUMIN 4.3 09/18/2019   CALCIUM 9.4 01/07/2021   ANIONGAP 6 10/28/2018   EGFR 92 01/07/2021   GFR 80.39 07/05/2018   Lab Results  Component Value Date   CHOL 117  09/18/2019   Lab Results  Component Value Date   HDL 53 09/18/2019   Lab Results  Component Value Date   LDLCALC 49 09/18/2019   Lab Results  Component Value Date   TRIG 70 09/18/2019   Lab Results  Component Value Date   CHOLHDL 2.2 09/18/2019   Lab Results  Component Value Date   HGBA1C 7.4 (A) 11/11/2020      Assessment & Plan:   #1 type 2 diabetes improved with A1c 6.2%.  Continue low glycemic diet.  We recommend he make sure he maintains adequate protein intake -He will try scaling the metformin back to 500 mg 1 twice daily and reassess A1c at follow-up in 4 to 6 months -Continue regular exercise habits  #2 chronic dyspepsia.  Reviewed recent ultrasound and labs. -Continue close follow-up with GI for ongoing symptoms   No orders of the defined types were placed in this encounter.   Follow-up: Return in about 6 months (around 08/14/2021).    Carolann Littler, MD

## 2021-02-12 ENCOUNTER — Ambulatory Visit (INDEPENDENT_AMBULATORY_CARE_PROVIDER_SITE_OTHER): Payer: Medicare Other | Admitting: Cardiology

## 2021-02-12 ENCOUNTER — Encounter: Payer: Self-pay | Admitting: Cardiology

## 2021-02-12 VITALS — BP 124/62 | HR 50 | Ht 73.0 in | Wt 206.2 lb

## 2021-02-12 DIAGNOSIS — I428 Other cardiomyopathies: Secondary | ICD-10-CM | POA: Diagnosis not present

## 2021-02-12 DIAGNOSIS — I5022 Chronic systolic (congestive) heart failure: Secondary | ICD-10-CM

## 2021-02-12 DIAGNOSIS — I10 Essential (primary) hypertension: Secondary | ICD-10-CM | POA: Diagnosis not present

## 2021-02-12 DIAGNOSIS — I480 Paroxysmal atrial fibrillation: Secondary | ICD-10-CM | POA: Diagnosis not present

## 2021-02-27 ENCOUNTER — Other Ambulatory Visit: Payer: Self-pay | Admitting: Cardiology

## 2021-03-14 ENCOUNTER — Other Ambulatory Visit: Payer: Self-pay | Admitting: Internal Medicine

## 2021-03-14 NOTE — Telephone Encounter (Signed)
Prescription refill request for Eliquis received. Indication: Afib Last office visit: 02/12/21 ([name])  Scr: 0.87 (01/07/21) Age: 70  Weight: 93.5kg  Appropriate dose and refill sent to requested pharmacy.

## 2021-04-17 ENCOUNTER — Other Ambulatory Visit: Payer: Self-pay | Admitting: Family Medicine

## 2021-04-21 ENCOUNTER — Encounter: Payer: Self-pay | Admitting: Internal Medicine

## 2021-04-21 ENCOUNTER — Ambulatory Visit (INDEPENDENT_AMBULATORY_CARE_PROVIDER_SITE_OTHER): Payer: Medicare Other | Admitting: Internal Medicine

## 2021-04-21 ENCOUNTER — Other Ambulatory Visit: Payer: Self-pay

## 2021-04-21 VITALS — BP 124/72 | HR 50 | Ht 73.0 in | Wt 209.6 lb

## 2021-04-21 DIAGNOSIS — I43 Cardiomyopathy in diseases classified elsewhere: Secondary | ICD-10-CM

## 2021-04-21 DIAGNOSIS — R000 Tachycardia, unspecified: Secondary | ICD-10-CM

## 2021-04-21 DIAGNOSIS — I4819 Other persistent atrial fibrillation: Secondary | ICD-10-CM | POA: Diagnosis not present

## 2021-04-21 DIAGNOSIS — I2510 Atherosclerotic heart disease of native coronary artery without angina pectoris: Secondary | ICD-10-CM

## 2021-04-21 DIAGNOSIS — I10 Essential (primary) hypertension: Secondary | ICD-10-CM

## 2021-04-21 LAB — BASIC METABOLIC PANEL
BUN/Creatinine Ratio: 19 (ref 10–24)
BUN: 17 mg/dL (ref 8–27)
CO2: 23 mmol/L (ref 20–29)
Calcium: 9.1 mg/dL (ref 8.6–10.2)
Chloride: 100 mmol/L (ref 96–106)
Creatinine, Ser: 0.9 mg/dL (ref 0.76–1.27)
Glucose: 92 mg/dL (ref 65–99)
Potassium: 4.4 mmol/L (ref 3.5–5.2)
Sodium: 138 mmol/L (ref 134–144)
eGFR: 91 mL/min/{1.73_m2} (ref >59)

## 2021-04-21 LAB — MAGNESIUM: Magnesium: 2 mg/dL (ref 1.6–2.3)

## 2021-04-21 MED ORDER — CARVEDILOL 12.5 MG PO TABS
12.5000 mg | ORAL_TABLET | Freq: Two times a day (BID) | ORAL | 3 refills | Status: DC
Start: 2021-04-21 — End: 2022-07-17

## 2021-04-21 NOTE — Progress Notes (Signed)
PCP: [name], MD Primary Cardiologist: Dr [name] Primary EP: Dr [name] is a 70 y.o. male who presents today for routine electrophysiology followup.  Since last being seen in our clinic, the patient reports doing very well.  He has rare palpitations.  Overall very pleased with current state. Today, he denies symptoms of chest pain, shortness of breath,  lower extremity edema, dizziness, presyncope, or syncope.  The patient is otherwise without complaint today.   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   Diabetes mellitus type II    Dyslipidemia    FASCIITIS, PLANTAR 06/20/2009   HYPERTENSION 06/20/2009   Past Surgical History:  Procedure Laterality Date   ablasion  1997   ATRIAL FIBRILLATION ABLATION N/A 03/26/2020   Procedure: ATRIAL FIBRILLATION ABLATION;  Surgeon: [name], MD;  Location: [place] [facility];  Service: Cardiovascular;  Laterality: N/A;   BACK SURGERY  2006   CARDIOVERSION N/A 08/15/2018   Procedure: CARDIOVERSION;  Surgeon: Fay Records, MD;  Location: [place];  Service: Cardiovascular;  Laterality: N/A;   CARDIOVERSION N/A 10/20/2018   Procedure: CARDIOVERSION;  Surgeon: Buford Dresser, MD;  Location: [place];  Service: Cardiovascular;  Laterality: N/A;   CARPAL TUNNEL RELEASE  2004   Bilateral   KNEE ARTHROSCOPY  2002   RIGHT/LEFT HEART CATH AND CORONARY ANGIOGRAPHY N/A 09/15/2018   Procedure: RIGHT/LEFT HEART CATH AND CORONARY ANGIOGRAPHY;  Surgeon: [name], Peter M, MD;  Location: [place] [facility];  Service: Cardiovascular;  Laterality: N/A;    ROS- all systems are reviewed and negatives except as per HPI above  Current Outpatient Medications  Medication Sig Dispense Refill   acetaminophen (TYLENOL) 500 MG tablet Take 1,000 mg by mouth every 6 (six) hours as needed for mild pain or moderate pain.     carvedilol (COREG) 25 MG tablet Take 1 tablet  (25 mg total) by mouth 2 (two) times daily. 180 tablet 1   dofetilide (TIKOSYN) 500 MCG capsule Take 1 capsule (500 mcg total) by mouth 2 (two) times daily. Please make overdue appt with Dr. Rayann Heman before anymore refills. 1st attempt 180 capsule 3   ELIQUIS 5 MG TABS tablet TAKE 1 TABLET BY MOUTH TWICE A DAY 180 tablet 1   ENTRESTO 24-26 MG TAKE 1 TABLET BY MOUTH TWICE A DAY 180 tablet 3   JARDIANCE 10 MG TABS tablet TAKE 1 TABLET BY MOUTH EVERY DAY 90 tablet 3   Magnesium 250 MG TABS Take 250 mg by mouth 3 (three) times a week.     metFORMIN (GLUCOPHAGE) 500 MG tablet Take two tablets by mouth twice daily. 360 tablet 3   Multiple Vitamin (MULTIVITAMIN WITH MINERALS) TABS tablet Take 1 tablet by mouth daily. Senior     rosuvastatin (CRESTOR) 20 MG tablet TAKE 1 TABLET BY MOUTH EVERY DAY 90 tablet 1   famotidine (PEPCID) 20 MG tablet Take 20 mg by mouth 2 (two) times daily. (Patient not taking: Reported on 04/21/2021)     pantoprazole (PROTONIX) 20 MG tablet Take 1 tablet by mouth 2 (two) times daily. (Patient not taking: Reported on 04/21/2021)     No current facility-administered medications for this visit.    Physical Exam: Vitals:   04/21/21 1021  BP: 124/72  Pulse: (!) 50  SpO2: 97%  Weight: 209 lb 9.6 oz (95.1 kg)  Height: '6\' 1"'$  (1.854 m)    GEN-  The patient is well appearing, alert and oriented x 3 today.   Head- normocephalic, atraumatic Eyes-  Sclera clear, conjunctiva pink Ears- hearing intact Oropharynx- clear Lungs- Clear to ausculation bilaterally, normal work of breathing Heart- Regular rate and rhythm, no murmurs, rubs or gallops, PMI not laterally displaced GI- soft, NT, ND, + BS Extremities- no clubbing, cyanosis, or edema  Wt Readings from Last 3 Encounters:  04/21/21 209 lb 9.6 oz (95.1 kg)  02/12/21 206 lb 3.2 oz (93.5 kg)  02/11/21 206 lb (93.4 kg)    EKG tracing ordered today is personally reviewed and shows sinus bradycardia, qtc 450 msec  Echo 10/02/20  reviewed.  EF 55%  Assessment and Plan:  Persistent afib Doing well post ablation He has some palpitations.  I have encouraged him to use his Jodelle Red and to bring strips to next appointment continue tikosyn for now Labs 5/11 reviewed Bmet, mg ordered today Reduce coreg to 12.'5mg'$  BID  2. HTN Stable No change required today  3. CAD No ischemic symptoms  4. Tachycardia mediated CM EF has recovered with sinus  Risks, benefits and potential toxicities for medications prescribed and/or refilled reviewed with patient today.   Return to AF clinic in 6 months  [name] MD, [facility] 04/21/2021 10:41 AM

## 2021-04-21 NOTE — Patient Instructions (Addendum)
Medication Instructions:  Reduce Coreg 12.5 mg two times a day Your physician recommends that you continue on your current medications as directed. Please refer to the Current Medication list given to you today.  Labwork: BMP, Mag  Testing/Procedures: None ordered.  Follow-Up: Your physician wants you to follow-up in: 6 months with the Afib Clinic. They will contact you to schedule.    Any Other Special Instructions Will Be Listed Below (If Applicable).  If you need a refill on your cardiac medications before your next appointment, please call your pharmacy.

## 2021-04-28 ENCOUNTER — Telehealth: Payer: Self-pay | Admitting: *Deleted

## 2021-04-28 NOTE — Telephone Encounter (Signed)
Called to schedule medicare wellness visit pt was disconnected will call back

## 2021-05-23 ENCOUNTER — Encounter: Payer: Self-pay | Admitting: Family Medicine

## 2021-05-23 ENCOUNTER — Telehealth: Payer: Self-pay | Admitting: Family Medicine

## 2021-05-23 NOTE — Telephone Encounter (Signed)
Spoke with the patient. He is aware of Dr. Erick Blinks message. Nothing further needed.

## 2021-05-23 NOTE — Telephone Encounter (Signed)
Patient called to schedule virtual appointment for being covid positive. Patient is scheduled for Monday at 4:30 but wants to know if he can have a decongestant sent in today to help with the congestion in sinuses. Decongestant would have to work with his current medications, as patient is worried about reactions between them    Please send to  [id number] - [place], [place] - [address] Phone:  [phone]  Fax:  [phone]       Good callback number is [phone]   Please Advise

## 2021-05-23 NOTE — Telephone Encounter (Signed)
Its probably too late at this point to benefit from antiviral therapy as this is most effective early in onset. Would just monitor for now. Hope they feel better.   Joliana Claflin [name] MD, [facility]

## 2021-05-26 ENCOUNTER — Telehealth: Payer: Medicare Other | Admitting: Family Medicine

## 2021-08-15 ENCOUNTER — Ambulatory Visit (INDEPENDENT_AMBULATORY_CARE_PROVIDER_SITE_OTHER): Payer: Medicare Other | Admitting: Family Medicine

## 2021-08-15 ENCOUNTER — Encounter: Payer: Self-pay | Admitting: Family Medicine

## 2021-08-15 VITALS — BP 138/70 | HR 53 | Temp 97.8°F | Ht 73.0 in | Wt 220.5 lb

## 2021-08-15 DIAGNOSIS — I10 Essential (primary) hypertension: Secondary | ICD-10-CM | POA: Diagnosis not present

## 2021-08-15 DIAGNOSIS — E1165 Type 2 diabetes mellitus with hyperglycemia: Secondary | ICD-10-CM | POA: Diagnosis not present

## 2021-08-15 DIAGNOSIS — E785 Hyperlipidemia, unspecified: Secondary | ICD-10-CM

## 2021-08-15 LAB — HEPATIC FUNCTION PANEL
ALT: 20 U/L (ref 0–53)
AST: 21 U/L (ref 0–37)
Albumin: 4.4 g/dL (ref 3.5–5.2)
Alkaline Phosphatase: 57 U/L (ref 39–117)
Bilirubin, Direct: 0.2 mg/dL (ref 0.0–0.3)
Total Bilirubin: 1 mg/dL (ref 0.2–1.2)
Total Protein: 7.4 g/dL (ref 6.0–8.3)

## 2021-08-15 LAB — LIPID PANEL
Cholesterol: 117 mg/dL (ref 0–200)
HDL: 56.3 mg/dL (ref >39.00)
LDL Cholesterol: 44 mg/dL (ref 0–99)
NonHDL: 60.72
Total CHOL/HDL Ratio: 2
Triglycerides: 82 mg/dL (ref 0.0–149.0)
VLDL: 16.4 mg/dL (ref 0.0–40.0)

## 2021-08-15 LAB — BASIC METABOLIC PANEL
BUN: 15 mg/dL (ref 6–23)
CO2: 31 mEq/L (ref 19–32)
Calcium: 9.4 mg/dL (ref 8.4–10.5)
Chloride: 101 mEq/L (ref 96–112)
Creatinine, Ser: 0.94 mg/dL (ref 0.40–1.50)
GFR: 80.71 mL/min (ref >60.00)
Glucose, Bld: 127 mg/dL — ABNORMAL HIGH (ref 70–99)
Potassium: 4.3 mEq/L (ref 3.5–5.1)
Sodium: 137 mEq/L (ref 135–145)

## 2021-08-15 LAB — HEMOGLOBIN A1C: Hgb A1c MFr Bld: 6.9 % — ABNORMAL HIGH (ref 4.6–6.5)

## 2021-08-15 NOTE — Progress Notes (Signed)
Established Patient Office Visit  Subjective:  Patient ID: Christopher Navarro, male    DOB: 1951-01-30  Age: 71 y.o. MRN: [id number]  CC:  Chief Complaint  Patient presents with   Follow-up    HPI Christopher Navarro presents for atrial fibrillation, hypertension, systolic heart failure, type 2 diabetes, hyperlipidemia.  He has gained some weight since last visit.  He has been poorly compliant with diet but has started back with more diligent exercise recently.  Weight is up about 14 pounds from last summer.  His last A1c was 6.2%.  He suspects this will be higher today.  He is compliant with all medications.  Denies any dyspnea.  No peripheral edema.  He remains on Eliquis and multiple other medications for his heart failure.  No recent dizziness.  No chest pains.  He is doing good combination of aerobic and resistance training.  His son is a Aeronautical engineer had [place] back in October.  Relatively mild symptoms.  He is fully vaccinated.  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   Diabetes mellitus type II    Dyslipidemia    FASCIITIS, PLANTAR 06/20/2009   HYPERTENSION 06/20/2009    Past Surgical History:  Procedure Laterality Date   ablasion  1997   ATRIAL FIBRILLATION ABLATION N/A 03/26/2020   Procedure: ATRIAL FIBRILLATION ABLATION;  Surgeon: [name], MD;  Location: [place];  Service: Cardiovascular;  Laterality: N/A;   BACK SURGERY  2006   CARDIOVERSION N/A 08/15/2018   Procedure: CARDIOVERSION;  Surgeon: Fay Records, MD;  Location: [facility];  Service: Cardiovascular;  Laterality: N/A;   CARDIOVERSION N/A 10/20/2018   Procedure: CARDIOVERSION;  Surgeon: Buford Dresser, MD;  Location: [facility];  Service: Cardiovascular;  Laterality: N/A;   CARPAL TUNNEL RELEASE  2004   Bilateral   KNEE ARTHROSCOPY  2002   RIGHT/LEFT HEART CATH AND CORONARY ANGIOGRAPHY N/A 09/15/2018   Procedure: RIGHT/LEFT HEART  CATH AND CORONARY ANGIOGRAPHY;  Surgeon: [name], Peter M, MD;  Location: [facility];  Service: Cardiovascular;  Laterality: N/A;    Family History  Problem Relation Age of Onset   Arthritis Other    Hyperlipidemia Other    Hypertension Other    Stroke Mother     Social History   Socioeconomic History   Marital status: Married    Spouse name: Not on file   Number of children: Not on file   Years of education: Not on file   Highest education level: Not on file  Occupational History   Not on file  Tobacco Use   Smoking status: Former   Smokeless tobacco: Never  Vaping Use   Vaping Use: Never used  Substance and Sexual Activity   Alcohol use: No   Drug use: No   Sexual activity: Not on file  Other Topics Concern   Not on file  Social History Narrative   retired   Investment banker, operational of Radio broadcast assistant Strain: Not on file  Food Insecurity: Not on file  Transportation Needs: Not on file  Physical Activity: Not on file  Stress: Not on file  Social Connections: Not on file  Intimate Partner Violence: Not on file    Outpatient Medications Prior to Visit  Medication Sig Dispense Refill   acetaminophen (TYLENOL) 500 MG tablet Take 1,000 mg by mouth every 6 (six) hours as needed for mild pain  or moderate pain.     carvedilol (COREG) 12.5 MG tablet Take 1 tablet (12.5 mg total) by mouth 2 (two) times daily. 180 tablet 3   dofetilide (TIKOSYN) 500 MCG capsule Take 1 capsule (500 mcg total) by mouth 2 (two) times daily. Please make overdue appt with Dr. Rayann Heman before anymore refills. 1st attempt 180 capsule 3   ELIQUIS 5 MG TABS tablet TAKE 1 TABLET BY MOUTH TWICE A DAY 180 tablet 1   ENTRESTO 24-26 MG TAKE 1 TABLET BY MOUTH TWICE A DAY 180 tablet 3   JARDIANCE 10 MG TABS tablet TAKE 1 TABLET BY MOUTH EVERY DAY 90 tablet 3   Magnesium 250 MG TABS Take 250 mg by mouth 3 (three) times a week.     metFORMIN (GLUCOPHAGE) 500 MG tablet Take two tablets by mouth  twice daily. 360 tablet 3   Multiple Vitamin (MULTIVITAMIN WITH MINERALS) TABS tablet Take 1 tablet by mouth daily. Senior     rosuvastatin (CRESTOR) 20 MG tablet TAKE 1 TABLET BY MOUTH EVERY DAY 90 tablet 1   famotidine (PEPCID) 20 MG tablet Take 20 mg by mouth 2 (two) times daily. (Patient not taking: Reported on 04/21/2021)     pantoprazole (PROTONIX) 20 MG tablet Take 1 tablet by mouth 2 (two) times daily. (Patient not taking: Reported on 04/21/2021)     No facility-administered medications prior to visit.    Allergies  Allergen Reactions   Ciprofloxacin Hives and Rash    ROS Review of Systems  Constitutional:  Negative for fatigue and unexpected weight change.  Eyes:  Negative for visual disturbance.  Respiratory:  Negative for cough, chest tightness and shortness of breath.   Cardiovascular:  Negative for chest pain, palpitations and leg swelling.  Genitourinary:  Negative for dysuria.  Neurological:  Negative for dizziness, syncope, weakness, light-headedness and headaches.     Objective:    Physical Exam Constitutional:      Appearance: He is well-developed.  Neck:     Thyroid: No thyromegaly.  Cardiovascular:     Rate and Rhythm: Normal rate and regular rhythm.  Pulmonary:     Effort: Pulmonary effort is normal. No respiratory distress.     Breath sounds: Normal breath sounds. No wheezing or rales.  Musculoskeletal:     Cervical back: Neck supple.     Right lower leg: No edema.     Left lower leg: No edema.  Neurological:     Mental Status: He is alert and oriented to person, place, and time.    BP 138/70 (BP Location: Left Arm, Cuff Size: Normal)    Pulse (!) 53    Temp 97.8 F (36.6 C) (Oral)    Ht _0  (1.854 m)    Wt 220 lb 8 oz (100 kg)    SpO2 99%    BMI 29.09 kg/m  Wt Readings from Last 3 Encounters:  08/15/21 220 lb 8 oz (100 kg)  04/21/21 209 lb 9.6 oz (95.1 kg)  02/12/21 206 lb 3.2 oz (93.5 kg)     Health Maintenance Due  Topic Date Due   FOOT  EXAM  02/22/2021   HEMOGLOBIN A1C  08/14/2021    There are no preventive care reminders to display for this patient.  Lab Results  Component Value Date   TSH 1.17 07/05/2018   Lab Results  Component Value Date   WBC 7.2 01/07/2021   HGB 15.7 01/07/2021   HCT 48.9 01/07/2021   MCV 82 01/07/2021  PLT 340 01/07/2021   Lab Results  Component Value Date   NA 138 04/21/2021   K 4.4 04/21/2021   CO2 23 04/21/2021   GLUCOSE 92 04/21/2021   BUN 17 04/21/2021   CREATININE 0.90 04/21/2021   BILITOT 0.6 09/18/2019   ALKPHOS 77 09/18/2019   AST 22 09/18/2019   ALT 15 09/18/2019   PROT 7.2 09/18/2019   ALBUMIN 4.3 09/18/2019   CALCIUM 9.1 04/21/2021   ANIONGAP 6 10/28/2018   EGFR 91 04/21/2021   GFR 80.39 07/05/2018   Lab Results  Component Value Date   CHOL 117 09/18/2019   Lab Results  Component Value Date   HDL 53 09/18/2019   Lab Results  Component Value Date   LDLCALC 49 09/18/2019   Lab Results  Component Value Date   TRIG 70 09/18/2019   Lab Results  Component Value Date   CHOLHDL 2.2 09/18/2019   Lab Results  Component Value Date   HGBA1C 6.2 (A) 02/11/2021      Assessment & Plan:   Problem List Items Addressed This Visit       Unprioritized   Dyslipidemia   Relevant Orders   Lipid panel   Hepatic function panel   Essential hypertension - Primary   Relevant Orders   Basic metabolic panel   Type 2 diabetes mellitus, controlled ([place])   Relevant Orders   Hemoglobin A1c  -Recheck labs with A1c, lipid panel, hepatic panel, basic metabolic panel  -We have strongly challenged him to try to drop his weight back down 10 to 15 pounds at least and continue with consistent exercise  -We will plan routine medical follow-up in 6 months unless indicated otherwise by labs above  No orders of the defined types were placed in this encounter.   Follow-up: Return in about 6 months (around 02/12/2022).    Carolann Littler, MD

## 2021-08-20 NOTE — Progress Notes (Signed)
Date:  09/03/2021   ID:  Christopher Navarro, DOB March 08, 1951, MRN [id number]  PCP:  Christopher Post, MD  Cardiologist:  Christopher Evetts [name] MD Electrophysiologist:  Christopher Grayer MD  Chief Complaint: CHF   History of Present Illness:    Christopher Navarro is a 71 y.o. male who is seen for follow up Afib and CHF.   He has a history of WPW and underwent an ablation in 1997 by Dr. Caryl Navarro. He was seen by Dr. Gwenlyn Navarro on Jul 20, 2018 for evaluation of new onset AFib. He states that prior to Thanksgiving he noted increase fatigue. As time went on he developed more fatigue, SOB, and some chest discomfort.  He was anticoagulated. Echo showed global HK with EF 40-45%. He has been on Verapamil for rate control. He was seen in the Afib clinic and after anticoagulation he underwent DCCV on 08/15/18. He unfortunately had early return of Afib.  He has a history of NIDDM, HLD, and HTN. He also has a family history of CAD with a brother having CABG.   He was seen back in Afib clinic and options for AAD therapy reviewed. Given LV dysfunction options include Tikosyn or amiodarone. Patient's family was concerned about this approach and thought he needed further evaluation of his LV dysfunction. He was seen by me for a second opinion. On his initial visit we switched his Verapamil to Coreg due to negative inotropic effects of Verapamil. His lisinopril dose was increased. He subsequently underwent [facility] on 09/15/18. 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 Mildly elevated LV filling pressures and pulmonary HTN. Low cardiac output. His Coreg dose was increased. He was switched from lisinopril to [facility] and was also started on Jardiance. Since his visit with me in February he was seen by the Afib clinic and admitted for Tikosyn loading and he had DCCV on 10/20/18. QT remained normal. Subsequent follow up Echo in July 2020 was normal.   In 2021  he developed recurrent Afib despite Tikosyn. He was significantly symptomatic with marked fatigue.  He underwent Afib ablation by Dr Christopher Navarro on 03/26/20. More recently seen in EP clinic. Noted some breakthrough Afib lasting up to 45 minutes. Was kept on Tikosyn. Coreg dose increased back to 25 mg bid. He was experiencing a lot of GI bloating and discomfort at the time. Upper and lower EGD were normal Abd [place] was negative. Symptoms improved with reduction in metformin.   On visit today he states he is doing very well. No SOB. No chest pain. He is eating better now Navarro holiday. He has noted a couple of recent Afib episodes lasting about an hour. Rate up to 120. Thinks triggered by abdominal bloating. Is doing more cardio now.     Prior CV studies:   The following studies were reviewed today:  Echo 07/25/18: Study Conclusions   - Left ventricle: The cavity size was normal. There was mild   concentric hypertrophy. Systolic function was mildly to   moderately reduced. The estimated ejection fraction was in the   range of 40% to 45%. Diffuse hypokinesis. Features are consistent   with a pseudonormal left ventricular filling pattern, with   concomitant abnormal relaxation and increased filling pressure   (grade 2 diastolic dysfunction). Doppler parameters are   consistent with elevated ventricular end-diastolic filling   pressure. - Mitral valve: There was mild regurgitation. - Left atrium: The atrium was moderately dilated. - Right ventricle: The cavity size was normal. Wall thickness was  normal. Systolic function was normal. - Right atrium: The atrium was normal in size. - Tricuspid valve: There was no regurgitation. - Pericardium, extracardiac: There was no pericardial effusion.   Impressions:   - No prior study available for comparison.   Cardiac cath 09/25/18:  RIGHT/LEFT HEART CATH AND CORONARY ANGIOGRAPHY  Conclusion      Ost 1st Diag lesion is 99% stenosed. Ost Cx to Dist Cx lesion is 25% stenosed. There is moderate left ventricular systolic dysfunction. The left ventricular  ejection fraction is 35-45% by visual estimate. LV end diastolic pressure is mildly elevated. Hemodynamic findings consistent with mild pulmonary hypertension.   1. Left dominant circulation 2. 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 Global LV dysfunction. EF 40%. 4. Mildly elevated LV filling pressures 5. Mild pulmonary venous HTN 6. Low cardiac output.    Plan: will optimize therapy for CHF. Increase Coreg dose to 25 mg bid as Afib rate is not well controlled. Consider switching lisinopril to Entresto. Consider adding Jardiance for DM. Once CHF medications optimized will plan Tikosyn load and conversion to NSR. OK to resume Eliquis in am.     Echo 02/22/19:  IMPRESSIONS     1. The left ventricle has normal systolic function with an ejection  fraction of 60-65%. The cavity size was normal. Left ventricular diastolic  Doppler parameters are consistent with impaired relaxation.   2. The right ventricle has normal systolic function. The cavity was  normal. There is no increase in right ventricular wall thickness.   3. The aortic valve is tricuspid. Mild thickening of the aortic valve.  Mild calcification of the aortic valve.   10/02/2020: TTE IMPRESSIONS   1. Left ventricular ejection fraction, by estimation, is 55 to 60%. The  left ventricle has normal function. The left ventricle has no regional  wall motion abnormalities.   2. E/A ratio >2.2 which would be consistent with restrictive filling,  however, tissue dopplers normal and no other parameters suggestive of  restrictive process. Suspect impaired relaxation without restriction.   3. Right ventricular systolic function is normal. The right ventricular  size is normal.   4. The mitral valve is normal in structure. Trivial mitral valve  regurgitation.   5. The aortic valve is tricuspid. Aortic valve regurgitation is not  visualized. No aortic stenosis is present.   6. The inferior vena cava is normal  in size with greater than 50%  respiratory variability, suggesting right atrial pressure of 3 mmHg.       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   Diabetes mellitus type II    Dyslipidemia    FASCIITIS, PLANTAR 06/20/2009   HYPERTENSION 06/20/2009   Past Surgical History:  Procedure Laterality Date   ablasion  1997   ATRIAL FIBRILLATION ABLATION N/A 03/26/2020   Procedure: ATRIAL FIBRILLATION ABLATION;  Surgeon: Christopher Grayer, MD;  Location: [facility];  Service: Cardiovascular;  Laterality: N/A;   BACK SURGERY  2006   CARDIOVERSION N/A 08/15/2018   Procedure: CARDIOVERSION;  Surgeon: Fay Records, MD;  Location: [facility];  Service: Cardiovascular;  Laterality: N/A;   CARDIOVERSION N/A 10/20/2018   Procedure: CARDIOVERSION;  Surgeon: Buford Dresser, MD;  Location: [facility];  Service: Cardiovascular;  Laterality: N/A;   CARPAL TUNNEL RELEASE  2004   Bilateral   KNEE ARTHROSCOPY  2002   RIGHT/LEFT HEART CATH AND CORONARY ANGIOGRAPHY N/A  09/15/2018   Procedure: RIGHT/LEFT HEART CATH AND CORONARY ANGIOGRAPHY;  Surgeon: Navarro, Aaric Dolph M, MD;  Location: [facility];  Service: Cardiovascular;  Laterality: N/A;     Current Meds  Medication Sig   acetaminophen (TYLENOL) 500 MG tablet Take 1,000 mg by mouth every 6 (six) hours as needed for mild pain or moderate pain.   carvedilol (COREG) 12.5 MG tablet Take 1 tablet (12.5 mg total) by mouth 2 (two) times daily.   dofetilide (TIKOSYN) 500 MCG capsule Take 1 capsule (500 mcg total) by mouth 2 (two) times daily. Please make overdue appt with Dr. Rayann Navarro before anymore refills. 1st attempt   ELIQUIS 5 MG TABS tablet TAKE 1 TABLET BY MOUTH TWICE A DAY   ENTRESTO 24-26 MG TAKE 1 TABLET BY MOUTH TWICE A DAY   JARDIANCE 10 MG TABS tablet TAKE 1 TABLET BY MOUTH EVERY DAY   Magnesium 250 MG TABS Take 250 mg by mouth 3 (three) times a week.   metFORMIN  (GLUCOPHAGE) 500 MG tablet Take two tablets by mouth twice daily.   Multiple Vitamin (MULTIVITAMIN WITH MINERALS) TABS tablet Take 1 tablet by mouth daily. Senior   rosuvastatin (CRESTOR) 20 MG tablet TAKE 1 TABLET BY MOUTH EVERY DAY     Allergies:   Ciprofloxacin   Social History   Tobacco Use   Smoking status: Former   Smokeless tobacco: Never  Scientific laboratory technician Use: Never used  Substance Use Topics   Alcohol use: No   Drug use: No     Family Hx: The patient's family history includes Arthritis in an other family member; Hyperlipidemia in an other family member; Hypertension in an other family member; Stroke in his mother.  ROS:   Please see the history of present illness.     All other systems reviewed and are negative.   Labs/Other Tests and Data Reviewed:    Recent Labs: 01/07/2021: Hemoglobin 15.7; Platelets 340 04/21/2021: Magnesium 2.0 08/15/2021: ALT 20; BUN 15; Creatinine, Ser 0.94; Potassium 4.3; Sodium 137   Recent Lipid Panel Lab Results  Component Value Date/Time   CHOL 117 08/15/2021 08:34 AM   CHOL 117 09/18/2019 09:01 AM   TRIG 82.0 08/15/2021 08:34 AM   HDL 56.30 08/15/2021 08:34 AM   HDL 53 09/18/2019 09:01 AM   CHOLHDL 2 08/15/2021 08:34 AM   LDLCALC 44 08/15/2021 08:34 AM   LDLCALC 49 09/18/2019 09:01 AM    Wt Readings from Last 3 Encounters:  09/03/21 215 lb 12.8 oz (97.9 kg)  08/15/21 220 lb 8 oz (100 kg)  04/21/21 209 lb 9.6 oz (95.1 kg)    Objective:    Vital Signs:  BP 135/70    Pulse (!) 53    Ht 6\' 1"  (1.854 m)    Wt 215 lb 12.8 oz (97.9 kg)    SpO2 99%    BMI 28.47 kg/m  per patient report  GENERAL:  Well appearing WM  In NAD HEENT:  PERRL, EOMI, sclera are clear. Oropharynx is clear. NECK:  No jugular venous distention, carotid upstroke brisk and symmetric, no bruits, no thyromegaly or adenopathy LUNGS:  Clear to auscultation bilaterally CHEST:  Unremarkable HEART:  RRR,  PMI not displaced or sustained,S1 and S2 within normal  limits, no S3, no S4: no clicks, no rubs, no murmurs ABD:  Soft, nontender. BS +, no masses or bruits. No hepatomegaly, no splenomegaly EXT:  2 + pulses throughout, no edema, no cyanosis no clubbing SKIN:  Warm  and dry.  No rashes NEURO:  Alert and oriented x 3. Cranial nerves II through XII intact. PSYCH:  Cognitively intact    ASSESSMENT & PLAN:    1.  Chronic systolic CHF with nonischemic cardiomyopathy. EF down to 40% before.  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 Cardiac cath results as noted. Repeat Echo after restoration of sinus rhythm and optimization of medical therapy shows normalization of LV function. He is asymptomatic. Will continue Coreg, [place], [place].   2. Atrial fibrillation paroxysmal and symptomatic. S/p DCCV with early return of AFib. Then placed on Tikosyn  and repeat DCCV performed on 10/20/18.  Had recurrent Afib and is now s/p ablation in August 2021. Some  break through. Continue Tikosyn and will follow up with EP in about 3 months.  3. Remote history of WPW s/p ablation  4. HTN controlled  5. Hypercholesterolemia. Now on Crestor. At goal.   6. DM type 2 on metformin and Jardiance. Last A1c 6.9 %.    Medication Adjustments/Labs and Tests Ordered: Current medicines are reviewed at length with the patient today.  Concerns regarding medicines are outlined above.  Tests Ordered: CBC, CMET, A1c, lipids, magnesium  Medication Changes: No orders of the defined types were placed in this encounter.    Disposition:  Follow up 6 months.  Signed, Kate Sweetman [name], MD  09/03/2021 9:23 AM    [facility]

## 2021-08-27 ENCOUNTER — Other Ambulatory Visit: Payer: Self-pay | Admitting: Family Medicine

## 2021-09-03 ENCOUNTER — Other Ambulatory Visit: Payer: Self-pay

## 2021-09-03 ENCOUNTER — Encounter: Payer: Self-pay | Admitting: Cardiology

## 2021-09-03 ENCOUNTER — Ambulatory Visit (INDEPENDENT_AMBULATORY_CARE_PROVIDER_SITE_OTHER): Payer: Medicare Other | Admitting: Cardiology

## 2021-09-03 VITALS — BP 135/70 | HR 53 | Ht 73.0 in | Wt 215.8 lb

## 2021-09-03 DIAGNOSIS — I5022 Chronic systolic (congestive) heart failure: Secondary | ICD-10-CM

## 2021-09-03 DIAGNOSIS — I2510 Atherosclerotic heart disease of native coronary artery without angina pectoris: Secondary | ICD-10-CM | POA: Diagnosis not present

## 2021-09-03 DIAGNOSIS — I480 Paroxysmal atrial fibrillation: Secondary | ICD-10-CM | POA: Diagnosis not present

## 2021-09-03 DIAGNOSIS — I428 Other cardiomyopathies: Secondary | ICD-10-CM

## 2021-09-03 DIAGNOSIS — I10 Essential (primary) hypertension: Secondary | ICD-10-CM

## 2021-09-08 ENCOUNTER — Other Ambulatory Visit: Payer: Self-pay | Admitting: Cardiology

## 2021-09-08 NOTE — Telephone Encounter (Signed)
Prescription refill request for Eliquis received. Indication:Afib Last office visit:1/23 Scr:0.9 Age: 71 Weight:97.9 kg  Prescription refilled

## 2021-09-13 ENCOUNTER — Other Ambulatory Visit: Payer: Self-pay | Admitting: Cardiology

## 2021-09-23 ENCOUNTER — Telehealth: Payer: Self-pay | Admitting: Family Medicine

## 2021-09-23 NOTE — Telephone Encounter (Signed)
Spoke with patient to schedule AWV.   Patient declined stating he didn't think he would benefit from awv right now and to call back in 2024.

## 2021-12-03 ENCOUNTER — Ambulatory Visit (INDEPENDENT_AMBULATORY_CARE_PROVIDER_SITE_OTHER): Payer: Medicare Other | Admitting: Cardiology

## 2021-12-03 ENCOUNTER — Encounter: Payer: Self-pay | Admitting: Cardiology

## 2021-12-03 VITALS — BP 118/74 | HR 54 | Ht 73.0 in | Wt 221.0 lb

## 2021-12-03 DIAGNOSIS — I4819 Other persistent atrial fibrillation: Secondary | ICD-10-CM

## 2021-12-03 DIAGNOSIS — Z79899 Other long term (current) drug therapy: Secondary | ICD-10-CM

## 2021-12-03 DIAGNOSIS — I5022 Chronic systolic (congestive) heart failure: Secondary | ICD-10-CM

## 2021-12-03 NOTE — Patient Instructions (Signed)
Medication Instructions:  ?Your physician recommends that you continue on your current medications as directed. Please refer to the Current Medication list given to you today. ?*If you need a refill on your cardiac medications before your next appointment, please call your pharmacy* ? ?Lab Work: ?BMG, MAG ?If you have labs (blood work) drawn today and your tests are completely normal, you will receive your results only by: ?MyChart Message (if you have MyChart) OR ?A paper copy in the mail ?If you have any lab test that is abnormal or we need to change your treatment, we will call you to review the results. ? ?Testing/Procedures: ?None. ? ?Follow-Up: ?At [facility], you and your health needs are our priority.  As part of our continuing mission to provide you with exceptional heart care, we have created designated Provider Care Teams.  These Care Teams include your primary Cardiologist (physician) and Advanced Practice Providers (APPs -  Physician Assistants and Nurse Practitioners) who all work together to provide you with the care you need, when you need it. ? ?Your physician wants you to follow-up in: 6 months with Lars Mage, MD  ? ?  You will receive a reminder letter in the mail two months in advance. If you don't receive a letter, please call our office to schedule the follow-up appointment. ? ?We recommend signing up for the patient portal called "MyChart".  Sign up information is provided on this After Visit Summary.  MyChart is used to connect with patients for Virtual Visits (Telemedicine).  Patients are able to view lab/test results, encounter notes, upcoming appointments, etc.  Non-urgent messages can be sent to your provider as well.   ?To learn more about what you can do with MyChart, go to [email or web address].   ? ?Any Other Special Instructions Will Be Listed Below (If Applicable). ? ? ? ? ?  ? ? ?

## 2021-12-03 NOTE — Progress Notes (Signed)
?Electrophysiology Office Follow up Visit Note:   ? ?Date:  12/03/2021  ? ?ID:  Christopher Navarro, DOB Dec 30, 1950, MRN [id number] ? ?PCP:  [name], MD  ?[facility] Cardiologist:  Peter [name], MD  ?[facility] Electrophysiologist:  Vickie Epley, MD  ? ? ?Interval History:   ? ?Christopher Navarro is a 71 y.o. male who presents for a follow up visit.  The patient was previously followed by Dr. Rayann Heman for his history of persistent atrial fibrillation.  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 Caryl Comes.  He then had a catheter ablation for his persistent atrial fibrillation in August 2021.  He continues to have some episodes of breakthrough atrial fibrillation despite treatment with dofetilide.  He presents today to establish with a new EP and to discuss treatment options for his atrial fibrillation.  He is maintained on Eliquis for stroke prophylaxis. ? ? ? ?  ? ?Past Medical History:  ?Diagnosis Date  ? BENIGN NEOPLASM OF SKIN   ? Neoplasm of uncertain behavior of skin  ? Diabetes mellitus type II   ? Dyslipidemia   ? [place], [place] 06/20/2009  ? HYPERTENSION 06/20/2009  ? ? ?Past Surgical History:  ?Procedure Laterality Date  ? ablasion  1997  ? ATRIAL FIBRILLATION ABLATION N/A 03/26/2020  ? Procedure: ATRIAL FIBRILLATION ABLATION;  Surgeon: [name], MD;  Location: [place] [facility];  Service: Cardiovascular;  Laterality: N/A;  ? BACK SURGERY  2006  ? CARDIOVERSION N/A 08/15/2018  ? Procedure: CARDIOVERSION;  Surgeon: Fay Records, MD;  Location: [place];  Service: Cardiovascular;  Laterality: N/A;  ? CARDIOVERSION N/A 10/20/2018  ? Procedure: CARDIOVERSION;  Surgeon: Buford Dresser, MD;  Location: [facility] END[facility];  Service: Cardiovascular;  Laterality: N/A;  ? CARPAL TUNNEL RELEASE  2004  ? Bilateral  ? KNEE ARTHROSCOPY  2002  ? RIGHT/LEFT HEART CATH AND CORONARY ANGIOGRAPHY N/A 09/15/2018  ? Procedure: RIGHT/LEFT HEART CATH AND CORONARY ANGIOGRAPHY;  Surgeon: [name], Peter M, MD;   Location: [place] [facility];  Service: Cardiovascular;  Laterality: N/A;  ? ? ?Current Medications: ?Current Meds  ?Medication Sig  ? acetaminophen (TYLENOL) 500 MG tablet Take 1,000 mg by mouth every 6 (six) hours as needed for mild pain or moderate pain.  ? carvedilol (COREG) 12.5 MG tablet Take 1 tablet (12.5 mg total) by mouth 2 (two) times daily.  ? dofetilide (TIKOSYN) 500 MCG capsule Take 1 capsule (500 mcg total) by mouth 2 (two) times daily. Please make overdue appt with Dr. Rayann Heman before anymore refills. 1st attempt  ? ELIQUIS 5 MG TABS tablet TAKE 1 TABLET BY MOUTH TWICE A DAY  ? ENTRESTO 24-26 MG TAKE 1 TABLET BY MOUTH TWICE A DAY  ? JARDIANCE 10 MG TABS tablet TAKE 1 TABLET BY MOUTH EVERY DAY  ? Magnesium 250 MG TABS Take 250 mg by mouth 3 (three) times a week.  ? metFORMIN (GLUCOPHAGE) 500 MG tablet Take two tablets by mouth twice daily.  ? Multiple Vitamin (MULTIVITAMIN WITH MINERALS) TABS tablet Take 1 tablet by mouth daily. Senior  ? rosuvastatin (CRESTOR) 20 MG tablet TAKE 1 TABLET BY MOUTH EVERY DAY  ?  ? ?Allergies:   Ciprofloxacin  ? ?Social History  ? ?Socioeconomic History  ? Marital status: Married  ?  Spouse name: Not on file  ? Number of children: Not on file  ? Years of education: Not on file  ? Highest education level: Not on file  ?Occupational  History  ? Not on file  ?Tobacco Use  ? Smoking status: Former  ? Smokeless tobacco: Never  ?Vaping Use  ? Vaping Use: Never used  ?Substance and Sexual Activity  ? Alcohol use: No  ? Drug use: No  ? Sexual activity: Not on file  ?Other Topics Concern  ? Not on file  ?Social History Narrative  ? retired  ? ?Social Determinants of Health  ? ?Financial Resource Strain: Not on file  ?Food Insecurity: Not on file  ?Transportation Needs: Not on file  ?Physical Activity: Not on file  ?Stress: Not on file  ?Social Connections: Not on file  ?  ? ?Family History: ?The patient's family history includes Arthritis in an other family member; Hyperlipidemia in  an other family member; Hypertension in an other family member; Stroke in his mother. ? ?ROS:   ?Please see the history of present illness.    ?All other systems reviewed and are negative. ? ?EKGs/Labs/Other Studies Reviewed:   ? ?The following studies were reviewed today: ? ?March 26, 2020 catheter ablation ?PVI with WACA approach ?Posterior wall isolation using a box lesion set ? ?October 02, 2020 echo ?Left ventricular function normal, 55% ?Trivial MR ? ?July 26, 2018 echo ?Left ventricular function moderately reduced, 40% ?Moderately dilated left atrium ?Mild MR ? ? ?EKG:  The ekg ordered today demonstrates sinus rhythm.  QTc 428 ms. ? ?Recent Labs: ?01/07/2021: Hemoglobin 15.7; Platelets 340 ?04/21/2021: Magnesium 2.0 ?08/15/2021: ALT 20; BUN 15; Creatinine, Ser 0.94; Potassium 4.3; Sodium 137  ?Recent Lipid Panel ?   ?Component Value Date/Time  ? CHOL 117 08/15/2021 0834  ? CHOL 117 09/18/2019 0901  ? TRIG 82.0 08/15/2021 0834  ? HDL 56.30 08/15/2021 0834  ? HDL 53 09/18/2019 0901  ? CHOLHDL 2 08/15/2021 0834  ? VLDL 16.4 08/15/2021 0834  ? [place] 44 08/15/2021 0834  ? LDLCALC 49 09/18/2019 0901  ? ? ?Physical Exam:   ? ?VS:  BP 118/74   Pulse (!) 54   Ht '6\' 1"'$  (1.854 m)   Wt 221 lb (100.2 kg)   SpO2 97%   BMI 29.16 kg/m?    ? ?Wt Readings from Last 3 Encounters:  ?12/03/21 221 lb (100.2 kg)  ?09/03/21 215 lb 12.8 oz (97.9 kg)  ?08/15/21 220 lb 8 oz (100 kg)  ?  ? ?GEN:  Well nourished, well developed in no acute distress ?HEENT: Normal ?NECK: No JVD; No carotid bruits ?LYMPHATICS: No lymphadenopathy ?CARDIAC: RRR, no murmurs, rubs, gallops ?RESPIRATORY:  Clear to auscultation without rales, wheezing or rhonchi  ?ABDOMEN: Soft, non-tender, non-distended ?MUSCULOSKELETAL:  No edema; No deformity  ?SKIN: Warm and dry ?NEUROLOGIC:  Alert and oriented x 3 ?PSYCHIATRIC:  Normal affect  ? ? ? ?  ? ?ASSESSMENT:   ? ?1. Persistent atrial fibrillation ([place])   ?2. Chronic systolic CHF (congestive heart failure)  ([place])   ?3. Encounter for long-term (current) use of high-risk medication   ? ?PLAN:   ? ?In order of problems listed above: ? ?#Persistent atrial fibrillation ?Maintaining sinus rhythm the vast majority the time on Tikosyn 500 mcg by mouth twice daily.  On Eliquis for stroke prophylaxis.  QTc acceptable for continued use of Tikosyn.  We will recheck blood work today.  I will have him follow-up in 4 to 6 months with an APP for continued monitoring of his Tikosyn. ? ?We did discuss the possibility of increased A-fib breakthrough episodes in the future.  If that were to become an issue  would need to regroup to discuss possible redo ablation. ? ?He will continue Eliquis for stroke prophylaxis ? ?#Chronic systolic heart failure ?On good medical therapy with Coreg, Jardiance, Entresto.  He is NYHA class I-II today.  Warm and dry on exam. ? ?Follow-up 6 months with me. ? ?Medication Adjustments/Labs and Tests Ordered: ?Current medicines are reviewed at length with the patient today.  Concerns regarding medicines are outlined above.  ?Orders Placed This Encounter  ?Procedures  ? Basic Metabolic Panel (BMET)  ? Magnesium  ? EKG 12-Lead  ? ?No orders of the defined types were placed in this encounter. ? ? ? ?Signed, ?Lars Mage, MD, [facility], [facility] ?12/03/2021 10:05 PM    ?Electrophysiology ?[place] ?

## 2021-12-04 LAB — MAGNESIUM: Magnesium: 2 mg/dL (ref 1.6–2.3)

## 2021-12-04 LAB — BASIC METABOLIC PANEL
BUN/Creatinine Ratio: 15 (ref 10–24)
BUN: 13 mg/dL (ref 8–27)
CO2: 26 mmol/L (ref 20–29)
Calcium: 10 mg/dL (ref 8.6–10.2)
Chloride: 101 mmol/L (ref 96–106)
Creatinine, Ser: 0.89 mg/dL (ref 0.76–1.27)
Glucose: 103 mg/dL — ABNORMAL HIGH (ref 70–99)
Potassium: 4.5 mmol/L (ref 3.5–5.2)
Sodium: 143 mmol/L (ref 134–144)
eGFR: 90 mL/min/{1.73_m2} (ref >59)

## 2022-01-26 ENCOUNTER — Other Ambulatory Visit: Payer: Self-pay | Admitting: Family Medicine

## 2022-02-17 ENCOUNTER — Encounter: Payer: Self-pay | Admitting: Family Medicine

## 2022-02-17 ENCOUNTER — Ambulatory Visit (INDEPENDENT_AMBULATORY_CARE_PROVIDER_SITE_OTHER): Payer: Medicare Other | Admitting: Family Medicine

## 2022-02-17 VITALS — BP 104/60 | HR 54 | Temp 97.4°F | Ht 73.0 in | Wt 213.7 lb

## 2022-02-17 DIAGNOSIS — R140 Abdominal distension (gaseous): Secondary | ICD-10-CM | POA: Diagnosis not present

## 2022-02-17 DIAGNOSIS — E1165 Type 2 diabetes mellitus with hyperglycemia: Secondary | ICD-10-CM

## 2022-02-17 DIAGNOSIS — I10 Essential (primary) hypertension: Secondary | ICD-10-CM | POA: Diagnosis not present

## 2022-02-17 LAB — POCT GLYCOSYLATED HEMOGLOBIN (HGB A1C): Hemoglobin A1C: 6.7 % — AB (ref 4.0–5.6)

## 2022-02-17 MED ORDER — METFORMIN HCL ER 750 MG PO TB24: 750.0000 mg | ORAL_TABLET | Freq: Every day | ORAL | 3 refills | Status: DC

## 2022-02-17 NOTE — Progress Notes (Signed)
Established Patient Office Visit  Subjective   Patient ID: Christopher Navarro, male    DOB: 1950-12-07  Age: 71 y.o. MRN: [id number]  Chief Complaint  Patient presents with   Follow-up    HPI   Mikki Santee is seen for medical follow-up.  He has history of hypertension, atrial fibrillation, chronic systolic heart failure, type 2 diabetes, dyslipidemia.  He has had some ongoing symptoms of abdominal bloating and intermittent diarrhea.  He saw GI and had evaluation with no clear etiology.  Never evaluated for SIBO.  He does take metformin 500 mg 2 tablets twice daily.  Diarrhea symptoms tend to be worse in the morning and then clear by evening.  Last A1c 6.9%.  He has managed to lose weight since last visit.  Perhaps slightly more active.  Has some chronic dyspnea with exertion but unchanged.  No recent chest pains.  He remains on multiple medications for heart failure including Entresto, Jardiance, carvedilol.  Lipids treated with rosuvastatin.  He had lab work in January and lipids were well controlled.  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   Diabetes mellitus type II    Dyslipidemia    FASCIITIS, [place] 06/20/2009   HYPERTENSION 06/20/2009   Past Surgical History:  Procedure Laterality Date   ablasion  1997   ATRIAL FIBRILLATION ABLATION N/A 03/26/2020   Procedure: ATRIAL FIBRILLATION ABLATION;  Surgeon: [name], MD;  Location: [place] [facility];  Service: Cardiovascular;  Laterality: N/A;   BACK SURGERY  2006   CARDIOVERSION N/A 08/15/2018   Procedure: CARDIOVERSION;  Surgeon: Fay Records, MD;  Location: [place];  Service: Cardiovascular;  Laterality: N/A;   CARDIOVERSION N/A 10/20/2018   Procedure: CARDIOVERSION;  Surgeon: Buford Dresser, MD;  Location: [place];  Service: Cardiovascular;  Laterality: N/A;   CARPAL TUNNEL RELEASE  2004   Bilateral   KNEE ARTHROSCOPY  2002   RIGHT/LEFT HEART CATH  AND CORONARY ANGIOGRAPHY N/A 09/15/2018   Procedure: RIGHT/LEFT HEART CATH AND CORONARY ANGIOGRAPHY;  Surgeon: [name], Peter M, MD;  Location: [place] [facility];  Service: Cardiovascular;  Laterality: N/A;    reports that he has quit smoking. He has never used smokeless tobacco. He reports that he does not drink alcohol and does not use drugs. family history includes Arthritis in an other family member; Hyperlipidemia in an other family member; Hypertension in an other family member; Stroke in his mother. Allergies  Allergen Reactions   Ciprofloxacin Hives and Rash    Other reaction(s): hives and itching    Review of Systems  Constitutional:  Negative for malaise/fatigue.  Eyes:  Negative for blurred vision.  Respiratory:  Negative for cough.   Cardiovascular:  Negative for chest pain.  Gastrointestinal:  Positive for abdominal pain and diarrhea. Negative for blood in stool, melena, nausea and vomiting.  Genitourinary:  Negative for dysuria.  Neurological:  Negative for dizziness, weakness and headaches.      Objective:     BP 104/60 (BP Location: Left Arm, Patient Position: Sitting, Cuff Size: Normal)   Pulse (!) 54   Temp (!) 97.4 F (36.3 C) (Oral)   Ht '6\' 1"'$  (1.854 m)   Wt 213 lb 11.2 oz (96.9 kg)   SpO2 98%   BMI 28.19 kg/m    Physical Exam Constitutional:      Appearance: He is well-developed.  Eyes:     Pupils: Pupils are equal,  round, and reactive to light.  Neck:     Thyroid: No thyromegaly.  Cardiovascular:     Rate and Rhythm: Normal rate.  Pulmonary:     Effort: Pulmonary effort is normal. No respiratory distress.     Breath sounds: Normal breath sounds. No wheezing or rales.  Musculoskeletal:     Cervical back: Neck supple.     Right lower leg: No edema.     Left lower leg: No edema.  Neurological:     Mental Status: He is alert and oriented to person, place, and time.      Results for orders placed or performed in visit on 02/17/22  POCT  glycosylated hemoglobin (Hb A1C)  Result Value Ref Range   Hemoglobin A1C 6.7 (A) 4.0 - 5.6 %   HbA1c POC (<> result, manual entry)     HbA1c, POC (prediabetic range)     HbA1c, POC (controlled diabetic range)        The ASCVD Risk score (Arnett DK, et al., 2019) failed to calculate for the following reasons:   The valid total cholesterol range is 130 to 320 mg/dL   Unable to determine if patient is Non-Hispanic African American    Assessment & Plan:   #1 type 2 diabetes.  Slightly improved with A1c 6.7%.  Continue Jardiance.  Consider changing metformin as below.  Continue regular exercise habits.  Recheck in 3 months  #2 abdominal bloating and intermittent diarrhea.  Question whether this may be related to metformin.  We decided to switch from immediate release to extended release 750 mg once daily.  Be in touch in a couple weeks if not improved with the above.  Will recommend GI follow-up if not improving with change in metformin  #3 hypertension stable and well-controlled.  Continue current medications.   Return in about 3 months (around 05/20/2022).    Carolann Littler, MD

## 2022-02-17 NOTE — Patient Instructions (Signed)
Let me know in two weeks if abdominal symptoms not improved with change in Metformin.

## 2022-02-22 ENCOUNTER — Other Ambulatory Visit: Payer: Self-pay | Admitting: Family Medicine

## 2022-03-06 NOTE — Progress Notes (Unsigned)
Date:  03/11/2022   ID:  Christopher Navarro, DOB Oct 01, 1950, MRN [id number]  PCP:  [name], MD  Cardiologist:  Andreina Outten [name] MD Electrophysiologist:  [name] MD  Chief Complaint: CHF   History of Present Illness:    Christopher Navarro is a 71 y.o. male who is seen for follow up Afib and CHF.   He has a history of WPW and underwent an ablation in 1997 by Dr. Caryl Comes. He was seen by Dr. Gwenlyn Found on Jul 20, 2018 for evaluation of new onset AFib. He states that prior to Thanksgiving he noted increase fatigue. As time went on he developed more fatigue, SOB, and some chest discomfort.  He was anticoagulated. Echo showed global HK with EF 40-45%. He has been on Verapamil for rate control. He was seen in the Afib clinic and after anticoagulation he underwent DCCV on 08/15/18. He unfortunately had early return of Afib.  He has a history of NIDDM, HLD, and HTN. He also has a family history of CAD with a brother having CABG.   He was seen back in Afib clinic and options for AAD therapy reviewed. Given LV dysfunction options include Tikosyn or amiodarone. Patient's family was concerned about this approach and thought he needed further evaluation of his LV dysfunction. He was seen by me for a second opinion. On his initial visit we switched his Verapamil to Coreg due to negative inotropic effects of Verapamil. His lisinopril dose was increased. He subsequently underwent [facility] on 09/15/18. 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 Mildly elevated LV filling pressures and pulmonary HTN. Low cardiac output. His Coreg dose was increased. He was switched from lisinopril to [facility] and was also started on Jardiance. Since his visit with me in February he was seen by the Afib clinic and admitted for Tikosyn loading and he had DCCV on 10/20/18. QT remained normal. Subsequent follow up Echo in July 2020 was normal.   In 2021  he developed recurrent Afib despite Tikosyn. He was significantly symptomatic with marked fatigue.  He underwent Afib ablation by Dr Rayann Heman on 03/26/20. More recently seen in EP clinic. Noted some breakthrough Afib lasting up to 45 minutes. Was kept on Tikosyn. Coreg dose increased back to 25 mg bid. He was experiencing a lot of GI bloating and discomfort at the time. Upper and lower EGD were normal Abd [place] was negative. Symptoms improved with reduction in metformin.   He had follow up with EP-Dr Quentin Ore. Felt to be doing well on Tikosyn and therapy continued.   On follow up today he is doing well. No chest pain, dyspnea or edema. Weight is stable. Did change to metformin XR due to bowel issues and this has improved. Rare minor skip in heart beat. No Afib.      Prior CV studies:   The following studies were reviewed today:  Echo 07/25/18: Study Conclusions   - Left ventricle: The cavity size was normal. There was mild   concentric hypertrophy. Systolic function was mildly to   moderately reduced. The estimated ejection fraction was in the   range of 40% to 45%. Diffuse hypokinesis. Features are consistent   with a pseudonormal left ventricular filling pattern, with   concomitant abnormal relaxation and increased filling pressure   (grade 2 diastolic dysfunction). Doppler parameters are   consistent with elevated ventricular end-diastolic filling   pressure. - Mitral valve: There was mild regurgitation. - Left atrium: The atrium was moderately dilated. - Right ventricle: The  cavity size was normal. Wall thickness was   normal. Systolic function was normal. - Right atrium: The atrium was normal in size. - Tricuspid valve: There was no regurgitation. - Pericardium, extracardiac: There was no pericardial effusion.   Impressions:   - No prior study available for comparison.   Cardiac cath 09/25/18:  RIGHT/LEFT HEART CATH AND CORONARY ANGIOGRAPHY  Conclusion      Ost 1st Diag lesion is 99% stenosed. Ost Cx to Dist Cx lesion is 25% stenosed. There is moderate left ventricular  systolic dysfunction. The left ventricular ejection fraction is 35-45% by visual estimate. LV end diastolic pressure is mildly elevated. Hemodynamic findings consistent with mild pulmonary hypertension.   1. Left dominant circulation 2. 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 Global LV dysfunction. EF 40%. 4. Mildly elevated LV filling pressures 5. Mild pulmonary venous HTN 6. Low cardiac output.    Plan: will optimize therapy for CHF. Increase Coreg dose to 25 mg bid as Afib rate is not well controlled. Consider switching lisinopril to Entresto. Consider adding Jardiance for DM. Once CHF medications optimized will plan Tikosyn load and conversion to NSR. OK to resume Eliquis in am.     Echo 02/22/19:  IMPRESSIONS     1. The left ventricle has normal systolic function with an ejection  fraction of 60-65%. The cavity size was normal. Left ventricular diastolic  Doppler parameters are consistent with impaired relaxation.   2. The right ventricle has normal systolic function. The cavity was  normal. There is no increase in right ventricular wall thickness.   3. The aortic valve is tricuspid. Mild thickening of the aortic valve.  Mild calcification of the aortic valve.   10/02/2020: TTE IMPRESSIONS   1. Left ventricular ejection fraction, by estimation, is 55 to 60%. The  left ventricle has normal function. The left ventricle has no regional  wall motion abnormalities.   2. E/A ratio >2.2 which would be consistent with restrictive filling,  however, tissue dopplers normal and no other parameters suggestive of  restrictive process. Suspect impaired relaxation without restriction.   3. Right ventricular systolic function is normal. The right ventricular  size is normal.   4. The mitral valve is normal in structure. Trivial mitral valve  regurgitation.   5. The aortic valve is tricuspid. Aortic valve regurgitation is not  visualized. No aortic stenosis is  present.   6. The inferior vena cava is normal in size with greater than 50%  respiratory variability, suggesting right atrial pressure of 3 mmHg.       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   Diabetes mellitus type II    Dyslipidemia    FASCIITIS, [place] 06/20/2009   HYPERTENSION 06/20/2009   Past Surgical History:  Procedure Laterality Date   ablasion  1997   ATRIAL FIBRILLATION ABLATION N/A 03/26/2020   Procedure: ATRIAL FIBRILLATION ABLATION;  Surgeon: [name], MD;  Location: [place] [facility];  Service: Cardiovascular;  Laterality: N/A;   BACK SURGERY  2006   CARDIOVERSION N/A 08/15/2018   Procedure: CARDIOVERSION;  Surgeon: Fay Records, MD;  [facility]: [facility];  Service: Cardiovascular;  Laterality: N/A;   CARDIOVERSION N/A 10/20/2018   Procedure: CARDIOVERSION;  Surgeon: Buford Dresser, MD;  Location: [place];  Service: Cardiovascular;  Laterality: N/A;   CARPAL TUNNEL RELEASE  2004   Bilateral   KNEE ARTHROSCOPY  2002  RIGHT/LEFT HEART CATH AND CORONARY ANGIOGRAPHY N/A 09/15/2018   Procedure: RIGHT/LEFT HEART CATH AND CORONARY ANGIOGRAPHY;  Surgeon: [name], Krystle Polcyn M, MD;  Location: [place] [facility];  Service: Cardiovascular;  Laterality: N/A;     Current Meds  Medication Sig   acetaminophen (TYLENOL) 500 MG tablet Take 1,000 mg by mouth every 6 (six) hours as needed for mild pain or moderate pain.   carvedilol (COREG) 12.5 MG tablet Take 1 tablet (12.5 mg total) by mouth 2 (two) times daily.   dofetilide (TIKOSYN) 500 MCG capsule Take 1 capsule (500 mcg total) by mouth 2 (two) times daily. Please make overdue appt with Dr. Rayann Heman before anymore refills. 1st attempt   ELIQUIS 5 MG TABS tablet TAKE 1 TABLET BY MOUTH TWICE A DAY   ENTRESTO 24-26 MG TAKE 1 TABLET BY MOUTH TWICE A DAY   JARDIANCE 10 MG TABS tablet TAKE 1 TABLET BY MOUTH EVERY DAY   Magnesium 250 MG TABS Take 250 mg  by mouth 3 (three) times a week.   metFORMIN (GLUCOPHAGE-XR) 750 MG 24 hr tablet Take 1 tablet (750 mg total) by mouth daily with breakfast.   Multiple Vitamin (MULTIVITAMIN WITH MINERALS) TABS tablet Take 1 tablet by mouth daily. Senior   rosuvastatin (CRESTOR) 20 MG tablet TAKE 1 TABLET BY MOUTH EVERY DAY     Allergies:   Ciprofloxacin   Social History   Tobacco Use   Smoking status: Former   Smokeless tobacco: Never  Scientific laboratory technician Use: Never used  Substance Use Topics   Alcohol use: No   Drug use: No     Family Hx: The patient's family history includes Arthritis in an other family member; Hyperlipidemia in an other family member; Hypertension in an other family member; Stroke in his mother.  ROS:   Please see the history of present illness.     All other systems reviewed and are negative.   Labs/Other Tests and Data Reviewed:    Recent Labs: 08/15/2021: ALT 20 12/03/2021: BUN 13; Creatinine, Ser 0.89; Magnesium 2.0; Potassium 4.5; Sodium 143   Recent Lipid Panel Lab Results  Component Value Date/Time   CHOL 117 08/15/2021 08:34 AM   CHOL 117 09/18/2019 09:01 AM   TRIG 82.0 08/15/2021 08:34 AM   HDL 56.30 08/15/2021 08:34 AM   HDL 53 09/18/2019 09:01 AM   CHOLHDL 2 08/15/2021 08:34 AM   LDLCALC 44 08/15/2021 08:34 AM   LDLCALC 49 09/18/2019 09:01 AM    Wt Readings from Last 3 Encounters:  03/11/22 217 lb 6.4 oz (98.6 kg)  02/17/22 213 lb 11.2 oz (96.9 kg)  12/03/21 221 lb (100.2 kg)    Objective:    Vital Signs:  BP 118/60 (BP Location: Left Arm, Patient Position: Sitting, Cuff Size: Normal)   Pulse (!) 56   Resp 20   Ht '6\' 1"'$  (1.854 m)   Wt 217 lb 6.4 oz (98.6 kg)   SpO2 99%   BMI 28.68 kg/m  per patient report  GENERAL:  Well appearing WM  In NAD HEENT:  PERRL, EOMI, sclera are clear. Oropharynx is clear. NECK:  No jugular venous distention, carotid upstroke brisk and symmetric, no bruits, no thyromegaly or adenopathy LUNGS:  Clear to  auscultation bilaterally CHEST:  Unremarkable HEART:  RRR,  PMI not displaced or sustained,S1 and S2 within normal limits, no S3, no S4: no clicks, no rubs, no murmurs ABD:  Soft, nontender. BS +, no masses or bruits. No hepatomegaly, no splenomegaly  EXT:  2 + pulses throughout, no edema, no cyanosis no clubbing SKIN:  Warm and dry.  No rashes NEURO:  Alert and oriented x 3. Cranial nerves II through XII intact. PSYCH:  Cognitively intact    ASSESSMENT & PLAN:    1.  Chronic systolic CHF with nonischemic cardiomyopathy. EF down to 40% before.  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 Cardiac cath results as noted. Repeat Echo after restoration of sinus rhythm and optimization of medical therapy shows normalization of LV function. He is asymptomatic. Will continue Coreg, [place], [place].   2. Atrial fibrillation paroxysmal and symptomatic. S/p DCCV with early return of AFib. Then placed on Tikosyn  and repeat DCCV performed on 10/20/18.  Had recurrent Afib and is now s/p ablation in August 2021. Some  break through. Continue Tikosyn and will follow up with EP  3. Remote history of WPW s/p ablation  4. HTN controlled  5. Hypercholesterolemia. Now on Crestor. At goal.   6. DM type 2 on metformin and Jardiance. Last A1c 6.7%.    Medication Adjustments/Labs and Tests Ordered: Current medicines are reviewed at length with the patient today.  Concerns regarding medicines are outlined above.  Tests Ordered: none  Medication Changes: No orders of the defined types were placed in this encounter.    Disposition:  Follow up 6 months.  Signed, Charles Niese [name], MD  03/11/2022 9:13 AM    [facility]

## 2022-03-11 ENCOUNTER — Encounter: Payer: Self-pay | Admitting: Cardiology

## 2022-03-11 ENCOUNTER — Ambulatory Visit (INDEPENDENT_AMBULATORY_CARE_PROVIDER_SITE_OTHER): Payer: Medicare Other | Admitting: Cardiology

## 2022-03-11 VITALS — BP 118/60 | HR 56 | Resp 20 | Ht 73.0 in | Wt 217.4 lb

## 2022-03-11 DIAGNOSIS — I10 Essential (primary) hypertension: Secondary | ICD-10-CM | POA: Diagnosis not present

## 2022-03-11 DIAGNOSIS — I5022 Chronic systolic (congestive) heart failure: Secondary | ICD-10-CM

## 2022-03-11 DIAGNOSIS — I480 Paroxysmal atrial fibrillation: Secondary | ICD-10-CM

## 2022-03-15 ENCOUNTER — Other Ambulatory Visit: Payer: Self-pay | Admitting: Physician Assistant

## 2022-04-08 LAB — HM DIABETES EYE EXAM

## 2022-04-09 ENCOUNTER — Encounter: Payer: Self-pay | Admitting: Family Medicine

## 2022-05-13 ENCOUNTER — Other Ambulatory Visit: Payer: Self-pay | Admitting: Cardiology

## 2022-05-13 DIAGNOSIS — I4819 Other persistent atrial fibrillation: Secondary | ICD-10-CM

## 2022-05-13 NOTE — Telephone Encounter (Signed)
Eliquis '5mg'$  refill request received. Patient is 71 years old, weight-98.6kg, Crea-0.89 on 12/03/2021, Diagnosis-Afib, and last seen by Dr. [name] on 03/11/2022. Dose is appropriate based on dosing criteria. Will send in refill to requested pharmacy.

## 2022-07-17 ENCOUNTER — Other Ambulatory Visit: Payer: Self-pay | Admitting: Internal Medicine

## 2022-08-09 ENCOUNTER — Other Ambulatory Visit: Payer: Self-pay | Admitting: Family Medicine

## 2022-08-17 ENCOUNTER — Ambulatory Visit (INDEPENDENT_AMBULATORY_CARE_PROVIDER_SITE_OTHER): Payer: Medicare Other | Admitting: Family Medicine

## 2022-08-17 ENCOUNTER — Encounter: Payer: Self-pay | Admitting: Family Medicine

## 2022-08-17 VITALS — BP 140/70 | HR 55 | Temp 97.6°F | Ht 73.0 in | Wt 226.8 lb

## 2022-08-17 DIAGNOSIS — E1165 Type 2 diabetes mellitus with hyperglycemia: Secondary | ICD-10-CM

## 2022-08-17 DIAGNOSIS — I10 Essential (primary) hypertension: Secondary | ICD-10-CM | POA: Diagnosis not present

## 2022-08-17 DIAGNOSIS — E785 Hyperlipidemia, unspecified: Secondary | ICD-10-CM

## 2022-08-17 DIAGNOSIS — Z79899 Other long term (current) drug therapy: Secondary | ICD-10-CM | POA: Diagnosis not present

## 2022-08-17 LAB — COMPREHENSIVE METABOLIC PANEL
ALT: 21 U/L (ref 0–53)
AST: 23 U/L (ref 0–37)
Albumin: 4.4 g/dL (ref 3.5–5.2)
Alkaline Phosphatase: 64 U/L (ref 39–117)
BUN: 13 mg/dL (ref 6–23)
CO2: 28 mEq/L (ref 19–32)
Calcium: 9.2 mg/dL (ref 8.4–10.5)
Chloride: 103 mEq/L (ref 96–112)
Creatinine, Ser: 0.87 mg/dL (ref 0.40–1.50)
GFR: 85.3 mL/min (ref >60.00)
Glucose, Bld: 147 mg/dL — ABNORMAL HIGH (ref 70–99)
Potassium: 4.5 mEq/L (ref 3.5–5.1)
Sodium: 139 mEq/L (ref 135–145)
Total Bilirubin: 0.7 mg/dL (ref 0.2–1.2)
Total Protein: 7.9 g/dL (ref 6.0–8.3)

## 2022-08-17 LAB — CBC WITH DIFFERENTIAL/PLATELET
Basophils Absolute: 0 10*3/uL (ref 0.0–0.1)
Basophils Relative: 0.6 % (ref 0.0–3.0)
Eosinophils Absolute: 0.2 10*3/uL (ref 0.0–0.7)
Eosinophils Relative: 3.6 % (ref 0.0–5.0)
HCT: 50.9 % (ref 39.0–52.0)
Hemoglobin: 16.9 g/dL (ref 13.0–17.0)
Lymphocytes Relative: 26.3 % (ref 12.0–46.0)
Lymphs Abs: 1.8 10*3/uL (ref 0.7–4.0)
MCHC: 33.2 g/dL (ref 30.0–36.0)
MCV: 81.7 fl (ref 78.0–100.0)
Monocytes Absolute: 0.7 10*3/uL (ref 0.1–1.0)
Monocytes Relative: 10.6 % (ref 3.0–12.0)
Neutro Abs: 4 10*3/uL (ref 1.4–7.7)
Neutrophils Relative %: 58.9 % (ref 43.0–77.0)
Platelets: 247 10*3/uL (ref 150.0–400.0)
RBC: 6.24 Mil/uL — ABNORMAL HIGH (ref 4.22–5.81)
RDW: 15.1 % (ref 11.5–15.5)
WBC: 6.7 10*3/uL (ref 4.0–10.5)

## 2022-08-17 LAB — POCT GLYCOSYLATED HEMOGLOBIN (HGB A1C): Hemoglobin A1C: 8 % — AB (ref 4.0–5.6)

## 2022-08-17 LAB — LIPID PANEL
Cholesterol: 118 mg/dL (ref 0–200)
HDL: 54.2 mg/dL (ref >39.00)
LDL Cholesterol: 52 mg/dL (ref 0–99)
NonHDL: 63.59
Total CHOL/HDL Ratio: 2
Triglycerides: 60 mg/dL (ref 0.0–149.0)
VLDL: 12 mg/dL (ref 0.0–40.0)

## 2022-08-17 NOTE — Progress Notes (Signed)
Established Patient Office Visit  Subjective   Patient ID: Christopher Navarro, male    DOB: 19-Nov-1950  Age: 72 y.o. MRN: [id number]  No chief complaint on file.   HPI   Mikki Santee is seen for medical follow-up.  He has history of hypertension, atrial fibrillation, obesity, systolic heart failure, dyslipidemia.  Generally doing well although he has been poorly compliant with diet over the past few months and is gained 13 pounds since last visit.  Not monitoring sugars regularly.  A1c up to 8.0 today and previously 6 range.  He does remain on Jardiance and metformin and is compliant with those.  His other medications include Entresto, Tikosyn, Eliquis, carvedilol.  He had previous cardiac ablation.  Is not aware of any recent atrial fibrillation.  No dizziness or dyspnea or chest pains.  He has started back more consistent exercise with the beginning of this year.  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   Diabetes mellitus type II    Dyslipidemia    FASCIITIS, PLANTAR 06/20/2009   HYPERTENSION 06/20/2009   Past Surgical History:  Procedure Laterality Date   ablasion  1997   ATRIAL FIBRILLATION ABLATION N/A 03/26/2020   Procedure: ATRIAL FIBRILLATION ABLATION;  Surgeon: [name], MD;  Location: [facility];  Service: Cardiovascular;  Laterality: N/A;   BACK SURGERY  2006   CARDIOVERSION N/A 08/15/2018   Procedure: CARDIOVERSION;  Surgeon: Fay Records, MD;  Location: [place] [facility];  Service: Cardiovascular;  Laterality: N/A;   CARDIOVERSION N/A 10/20/2018   Procedure: CARDIOVERSION;  Surgeon: Buford Dresser, MD;  Location: [facility];  Service: Cardiovascular;  Laterality: N/A;   CARPAL TUNNEL RELEASE  2004   Bilateral   KNEE ARTHROSCOPY  2002   RIGHT/LEFT HEART CATH AND CORONARY ANGIOGRAPHY N/A 09/15/2018   Procedure: RIGHT/LEFT HEART CATH AND CORONARY ANGIOGRAPHY;  Surgeon: [name], Peter M, MD;  Location: [facility];  Service: Cardiovascular;  Laterality: N/A;    reports that he has quit smoking. He has never used smokeless tobacco. He reports that he does not drink alcohol and does not use drugs. family history includes Arthritis in an other family member; Hyperlipidemia in an other family member; Hypertension in an other family member; Stroke in his mother. Allergies  Allergen Reactions   Ciprofloxacin Hives and Rash    Other reaction(s): hives and itching    Review of Systems  Constitutional:  Negative for malaise/fatigue.  Eyes:  Negative for blurred vision.  Respiratory:  Negative for shortness of breath.   Cardiovascular:  Negative for chest pain.  Neurological:  Negative for dizziness, weakness and headaches.      Objective:     BP (!) 140/70 (BP Location: Left Arm, Patient Position: Sitting, Cuff Size: Large)   Pulse (!) 55   Temp 97.6 F (36.4 C) (Oral)   Ht '6\' 1"'$  (1.854 m)   Wt 226 lb 12.8 oz (102.9 kg)   SpO2 99%   BMI 29.92 kg/m    Physical Exam Vitals reviewed.  Constitutional:      Appearance: He is well-developed.  HENT:     Right Ear: External ear normal.     Left Ear: External ear normal.  Eyes:     Pupils: Pupils are equal, round, and reactive to light.  Neck:     Thyroid: No thyromegaly.  Cardiovascular:     Rate and Rhythm: Normal rate and  regular rhythm.  Pulmonary:     Effort: Pulmonary effort is normal. No respiratory distress.     Breath sounds: Normal breath sounds. No wheezing or rales.  Musculoskeletal:     Cervical back: Neck supple.     Right lower leg: No edema.     Left lower leg: No edema.  Neurological:     Mental Status: He is alert and oriented to person, place, and time.      Results for orders placed or performed in visit on 08/17/22  POCT glycosylated hemoglobin (Hb A1C)  Result Value Ref Range   Hemoglobin A1C 8.0 (A) 4.0 - 5.6 %   HbA1c POC (<> result, manual entry)     HbA1c, POC (prediabetic range)      HbA1c, POC (controlled diabetic range)        The ASCVD Risk score (Arnett DK, et al., 2019) failed to calculate for the following reasons:   The valid total cholesterol range is 130 to 320 mg/dL   Unable to determine if patient is Non-Hispanic African American    Assessment & Plan:   Problem List Items Addressed This Visit       Unprioritized   Type 2 diabetes mellitus, controlled ([place]) - Primary   Relevant Orders   POCT glycosylated hemoglobin (Hb A1C) (Completed)   Essential hypertension   Relevant Orders   CMP   Dyslipidemia   Relevant Orders   Lipid panel   CMP   Other Visit Diagnoses     High risk medication use       Relevant Orders   CBC with Differential/Platelet     Poorly controlled type 2 diabetes with A1c today 8.0%.  We discussed options including lifestyle modification versus addition of GLP-1 he would like to try the former.  Will give this 3 months of increased exercise and weight loss and repeat A1c at that time  Blood pressure also up today above goal.  He would like to give this 3 months of lifestyle modification first before considering additional medication.  He has follow-up with cardiology in a month and will get reassessed at that point  -Check labs as above including lipid panel, CMP, CBC  Return in about 3 months (around 11/16/2022).    Carolann Littler, MD

## 2022-08-17 NOTE — Patient Instructions (Signed)
Lose some weight!.  Tighten up diet and be diligent with exercise and let's plan on 3 month follow up  If A1C not closer to goal at that time we might want to consider GLP-1 medication.

## 2022-08-28 ENCOUNTER — Other Ambulatory Visit: Payer: Self-pay | Admitting: Cardiology

## 2022-09-09 NOTE — Progress Notes (Signed)
Date:  09/18/2022   ID:  Christopher Navarro, DOB 1951-03-24, MRN [id number]  PCP:  [name], MD  Cardiologist:  Asahel Risden [name] MD Electrophysiologist:  [name] MD  Chief Complaint: CHF   History of Present Illness:    Christopher Navarro is a 72 y.o. male who is seen for follow up Afib and CHF.   He has a history of WPW and underwent an ablation in 1997 by Dr. Caryl Comes. He was seen by Dr. Gwenlyn Found on Jul 20, 2018 for evaluation of new onset AFib. He states that prior to Thanksgiving he noted increase fatigue. As time went on he developed more fatigue, SOB, and some chest discomfort.  He was anticoagulated. Echo showed global HK with EF 40-45%. He has been on Verapamil for rate control. He was seen in the Afib clinic and after anticoagulation he underwent DCCV on 08/15/18. He unfortunately had early return of Afib.  He has a history of NIDDM, HLD, and HTN. He also has a family history of CAD with a brother having CABG.   He was seen back in Afib clinic and options for AAD therapy reviewed. Given LV dysfunction options include Tikosyn or amiodarone. Patient's family was concerned about this approach and thought he needed further evaluation of his LV dysfunction. He was seen by me for a second opinion. On his initial visit we switched his Verapamil to Coreg due to negative inotropic effects of Verapamil. His lisinopril dose was increased. He subsequently underwent [facility] on 09/15/18. 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 Mildly elevated LV filling pressures and pulmonary HTN. Low cardiac output. His Coreg dose was increased. He was switched from lisinopril to [facility] and was also started on Jardiance. Since his visit with me in February he was seen by the Afib clinic and admitted for Tikosyn loading and he had DCCV on 10/20/18. QT remained normal. Subsequent follow up Echo in July 2020 was normal.   In 2021  he developed recurrent Afib despite Tikosyn. He was significantly symptomatic with marked fatigue.  He underwent Afib ablation by Dr Rayann Heman on 03/26/20. More recently seen in EP clinic. Noted some breakthrough Afib lasting up to 45 minutes. Was kept on Tikosyn. Coreg dose increased back to 25 mg bid. He was experiencing a lot of GI bloating and discomfort at the time. Upper and lower EGD were normal Abd [place] was negative. Symptoms improved with reduction in metformin.   He had follow up with EP-Dr Quentin Ore. Felt to be doing well on Tikosyn and therapy continued.   On follow up today he is doing well. No chest pain, dyspnea or edema. Weight is stable.  Rare minor skip in heart beat. No Afib. Trying to stay active. Involved in starting a CERT team.      Prior CV studies:   The following studies were reviewed today:  Echo 07/25/18: Study Conclusions   - Left ventricle: The cavity size was normal. There was mild   concentric hypertrophy. Systolic function was mildly to   moderately reduced. The estimated ejection fraction was in the   range of 40% to 45%. Diffuse hypokinesis. Features are consistent   with a pseudonormal left ventricular filling pattern, with   concomitant abnormal relaxation and increased filling pressure   (grade 2 diastolic dysfunction). Doppler parameters are   consistent with elevated ventricular end-diastolic filling   pressure. - Mitral valve: There was mild regurgitation. - Left atrium: The atrium was moderately dilated. - Right ventricle: The cavity size  was normal. Wall thickness was   normal. Systolic function was normal. - Right atrium: The atrium was normal in size. - Tricuspid valve: There was no regurgitation. - Pericardium, extracardiac: There was no pericardial effusion.   Impressions:   - No prior study available for comparison.   Cardiac cath 09/25/18:  RIGHT/LEFT HEART CATH AND CORONARY ANGIOGRAPHY  Conclusion      Ost 1st Diag lesion is 99% stenosed. Ost Cx to Dist Cx lesion is 25% stenosed. There is moderate left ventricular systolic  dysfunction. The left ventricular ejection fraction is 35-45% by visual estimate. LV end diastolic pressure is mildly elevated. Hemodynamic findings consistent with mild pulmonary hypertension.   1. Left dominant circulation 2. 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 Global LV dysfunction. EF 40%. 4. Mildly elevated LV filling pressures 5. Mild pulmonary venous HTN 6. Low cardiac output.    Plan: will optimize therapy for CHF. Increase Coreg dose to 25 mg bid as Afib rate is not well controlled. Consider switching lisinopril to Entresto. Consider adding Jardiance for DM. Once CHF medications optimized will plan Tikosyn load and conversion to NSR. OK to resume Eliquis in am.     Echo 02/22/19:  IMPRESSIONS     1. The left ventricle has normal systolic function with an ejection  fraction of 60-65%. The cavity size was normal. Left ventricular diastolic  Doppler parameters are consistent with impaired relaxation.   2. The right ventricle has normal systolic function. The cavity was  normal. There is no increase in right ventricular wall thickness.   3. The aortic valve is tricuspid. Mild thickening of the aortic valve.  Mild calcification of the aortic valve.   10/02/2020: TTE IMPRESSIONS   1. Left ventricular ejection fraction, by estimation, is 55 to 60%. The  left ventricle has normal function. The left ventricle has no regional  wall motion abnormalities.   2. E/A ratio >2.2 which would be consistent with restrictive filling,  however, tissue dopplers normal and no other parameters suggestive of  restrictive process. Suspect impaired relaxation without restriction.   3. Right ventricular systolic function is normal. The right ventricular  size is normal.   4. The mitral valve is normal in structure. Trivial mitral valve  regurgitation.   5. The aortic valve is tricuspid. Aortic valve regurgitation is not  visualized. No aortic stenosis is present.    6. The inferior vena cava is normal in size with greater than 50%  respiratory variability, suggesting right atrial pressure of 3 mmHg.       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   Diabetes mellitus type II    Dyslipidemia    FASCIITIS, PLANTAR 06/20/2009   HYPERTENSION 06/20/2009   Past Surgical History:  Procedure Laterality Date   ablasion  1997   ATRIAL FIBRILLATION ABLATION N/A 03/26/2020   Procedure: ATRIAL FIBRILLATION ABLATION;  Surgeon: [name], MD;  Location: [place] [facility];  Service: Cardiovascular;  Laterality: N/A;   BACK SURGERY  2006   CARDIOVERSION N/A 08/15/2018   Procedure: CARDIOVERSION;  Surgeon: Fay Records, MD;  Location: [facility];  Service: Cardiovascular;  Laterality: N/A;   CARDIOVERSION N/A 10/20/2018   Procedure: CARDIOVERSION;  Surgeon: Buford Dresser, MD;  Location: [place];  Service: Cardiovascular;  Laterality: N/A;   CARPAL TUNNEL RELEASE  2004   Bilateral   KNEE ARTHROSCOPY  2002  RIGHT/LEFT HEART CATH AND CORONARY ANGIOGRAPHY N/A 09/15/2018   Procedure: RIGHT/LEFT HEART CATH AND CORONARY ANGIOGRAPHY;  Surgeon: [name], Jaylaa Gallion M, MD;  Location: [place] [facility];  Service: Cardiovascular;  Laterality: N/A;     Current Meds  Medication Sig   acetaminophen (TYLENOL) 500 MG tablet Take 1,000 mg by mouth every 6 (six) hours as needed for mild pain or moderate pain.   carvedilol (COREG) 12.5 MG tablet TAKE 1 TABLET BY MOUTH 2 TIMES DAILY.   dofetilide (TIKOSYN) 500 MCG capsule Take 1 capsule (500 mcg total) by mouth 2 (two) times daily.   ELIQUIS 5 MG TABS tablet TAKE 1 TABLET BY MOUTH TWICE A DAY   ENTRESTO 24-26 MG TAKE 1 TABLET BY MOUTH TWICE A DAY   JARDIANCE 10 MG TABS tablet TAKE 1 TABLET BY MOUTH EVERY DAY   Magnesium 250 MG TABS Take 250 mg by mouth 3 (three) times a week.   metFORMIN (GLUCOPHAGE-XR) 750 MG 24 hr tablet Take 1 tablet (750 mg total)  by mouth daily with breakfast.   Multiple Vitamin (MULTIVITAMIN WITH MINERALS) TABS tablet Take 1 tablet by mouth daily. Senior   rosuvastatin (CRESTOR) 20 MG tablet TAKE 1 TABLET BY MOUTH EVERY DAY     Allergies:   Ciprofloxacin   Social History   Tobacco Use   Smoking status: Former   Smokeless tobacco: Never  Scientific laboratory technician Use: Never used  Substance Use Topics   Alcohol use: No   Drug use: No     Family Hx: The patient's family history includes Arthritis in an other family member; Hyperlipidemia in an other family member; Hypertension in an other family member; Stroke in his mother.  ROS:   Please see the history of present illness.     All other systems reviewed and are negative.   Labs/Other Tests and Data Reviewed:    Recent Labs: 12/03/2021: Magnesium 2.0 08/17/2022: ALT 21; BUN 13; Creatinine, Ser 0.87; Hemoglobin 16.9; Platelets 247.0; Potassium 4.5; Sodium 139   Recent Lipid Panel Lab Results  Component Value Date/Time   CHOL 118 08/17/2022 09:04 AM   CHOL 117 09/18/2019 09:01 AM   TRIG 60.0 08/17/2022 09:04 AM   HDL 54.20 08/17/2022 09:04 AM   HDL 53 09/18/2019 09:01 AM   CHOLHDL 2 08/17/2022 09:04 AM   LDLCALC 52 08/17/2022 09:04 AM   LDLCALC 49 09/18/2019 09:01 AM    Wt Readings from Last 3 Encounters:  09/18/22 223 lb 12.8 oz (101.5 kg)  08/17/22 226 lb 12.8 oz (102.9 kg)  03/11/22 217 lb 6.4 oz (98.6 kg)   Ecg today shows NSR rate 54. QTc 447 msec. Normal. I have personally reviewed and interpreted this study.  Objective:    Vital Signs:  BP 120/68 (BP Location: Right Arm, Patient Position: Sitting, Cuff Size: Normal)   Pulse (!) 54   Ht 6' 1"$  (1.854 m)   Wt 223 lb 12.8 oz (101.5 kg)   BMI 29.53 kg/m  per patient report  GENERAL:  Well appearing WM  In NAD HEENT:  PERRL, EOMI, sclera are clear. Oropharynx is clear. NECK:  No jugular venous distention, carotid upstroke brisk and symmetric, no bruits, no thyromegaly or  adenopathy LUNGS:  Clear to auscultation bilaterally CHEST:  Unremarkable HEART:  RRR,  PMI not displaced or sustained,S1 and S2 within normal limits, no S3, no S4: no clicks, no rubs, no murmurs ABD:  Soft, nontender. BS +, no masses or bruits. No hepatomegaly, no splenomegaly  EXT:  2 + pulses throughout, no edema, no cyanosis no clubbing SKIN:  Warm and dry.  No rashes NEURO:  Alert and oriented x 3. Cranial nerves II through XII intact. PSYCH:  Cognitively intact    ASSESSMENT & PLAN:    1.  Chronic systolic CHF with nonischemic cardiomyopathy. EF down to 40% before.  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 Cardiac cath results as noted. Repeat Echo after restoration of sinus rhythm and optimization of medical therapy shows normalization of LV function. He is asymptomatic. Will continue Coreg, [place], [place].   2. Atrial fibrillation paroxysmal and symptomatic. S/p DCCV with early return of AFib. Then placed on Tikosyn  and repeat DCCV performed on 10/20/18.  Had recurrent Afib and is now s/p ablation in August 2021. Now well controlled on Tikosyn  3. Remote history of WPW s/p ablation  4. HTN controlled  5. Hypercholesterolemia. Now on Crestor. At goal.   6. DM type 2 on metformin and Jardiance. Last A1c 8.0%. increased post holidays. Working harder with diet now.   Medication Adjustments/Labs and Tests Ordered: Current medicines are reviewed at length with the patient today.  Concerns regarding medicines are outlined above.  Tests Ordered: none  Medication Changes: No orders of the defined types were placed in this encounter.    Disposition:  Follow up 6 months.  Signed, Paislyn Domenico [name], MD  09/18/2022 11:36 AM    [facility]

## 2022-09-18 ENCOUNTER — Ambulatory Visit: Payer: Medicare Other | Attending: Cardiology | Admitting: Cardiology

## 2022-09-18 ENCOUNTER — Encounter: Payer: Self-pay | Admitting: Cardiology

## 2022-09-18 VITALS — BP 120/68 | HR 54 | Ht 73.0 in | Wt 223.8 lb

## 2022-09-18 DIAGNOSIS — I5022 Chronic systolic (congestive) heart failure: Secondary | ICD-10-CM | POA: Diagnosis not present

## 2022-09-18 DIAGNOSIS — I10 Essential (primary) hypertension: Secondary | ICD-10-CM

## 2022-09-18 DIAGNOSIS — I2510 Atherosclerotic heart disease of native coronary artery without angina pectoris: Secondary | ICD-10-CM | POA: Diagnosis not present

## 2022-09-18 DIAGNOSIS — I480 Paroxysmal atrial fibrillation: Secondary | ICD-10-CM

## 2022-09-18 MED ORDER — ROSUVASTATIN CALCIUM 20 MG PO TABS: 20.0000 mg | ORAL_TABLET | Freq: Every day | ORAL | 3 refills | Status: DC

## 2022-09-18 NOTE — Patient Instructions (Signed)
Medication Instructions:  Continue current medication  *If you need a refill on your cardiac medications before your next appointment, please call your pharmacy*   Lab Work: None Ordered   Testing/Procedures: None Ordered   Follow-Up: At [facility], you and your health needs are our priority.  As part of our continuing mission to provide you with exceptional heart care, we have created designated Provider Care Teams.  These Care Teams include your primary Cardiologist (physician) and Advanced Practice Providers (APPs -  Physician Assistants and Nurse Practitioners) who all work together to provide you with the care you need, when you need it.  We recommend signing up for the patient portal called "MyChart".  Sign up information is provided on this After Visit Summary.  MyChart is used to connect with patients for Virtual Visits (Telemedicine).  Patients are able to view lab/test results, encounter notes, upcoming appointments, etc.  Non-urgent messages can be sent to your provider as well.   To learn more about what you can do with MyChart, go to [email or web address].    Your next appointment:   6 month(s)  Provider:   Peter [name], MD     Other Instructions

## 2022-09-22 NOTE — Addendum Note (Signed)
Addended by: Vennie Homans on: 09/22/2022 01:57 PM   Modules accepted: Orders

## 2022-09-23 ENCOUNTER — Telehealth: Payer: Self-pay | Admitting: Family Medicine

## 2022-09-23 NOTE — Telephone Encounter (Signed)
Spoke with patient to  schedule Medicare Annual Wellness Visit (AWV) either virtually or in office. my Herbie Drape number [phone]  Pt declined he stated they call every year to schedule this and he declines.  He stated he does not think he needs this and he would talk to provider next time he sees him.  Do not call   he will call if he thinks he needs this awv appt   awvi 08/10/14 per [facility]  please schedule with Nurse Health Adviser  30 min for all AWV appointments

## 2022-10-23 ENCOUNTER — Other Ambulatory Visit: Payer: Self-pay | Admitting: Cardiology

## 2022-11-11 ENCOUNTER — Other Ambulatory Visit: Payer: Self-pay | Admitting: Cardiology

## 2022-11-11 DIAGNOSIS — I4819 Other persistent atrial fibrillation: Secondary | ICD-10-CM

## 2022-11-11 NOTE — Telephone Encounter (Signed)
Pt last saw Dr [name] 09/18/22, last labs 08/17/22 Creat 0.87, age 72, weight 101.5kg, based on specified criteria pt is on appropriate dosage of Eliquis 5mg  BID for afib.  Will refill rx.

## 2022-12-14 ENCOUNTER — Encounter: Payer: Self-pay | Admitting: Cardiology

## 2022-12-17 ENCOUNTER — Other Ambulatory Visit: Payer: Self-pay | Admitting: Cardiology

## 2023-01-14 NOTE — Progress Notes (Signed)
  Electrophysiology Office Follow up Visit Note:    Date:  01/15/2023   ID:  Christopher Navarro, DOB 1950-08-20, MRN [id number]  PCP:  Kristian Covey, MD  [facility] Cardiologist:  [name], MD  [facility] Electrophysiologist:  Lanier Prude, MD    Interval History:    Christopher Navarro is a 72 y.o. male who presents for a follow up visit.   I last saw the patient December 03, 2021 for history of persistent atrial fibrillation on Tikosyn.  He is on Eliquis for stroke prophylaxis.  He last saw Dr. Swaziland September 18, 2022.  At that appointment he reported no sustained episodes of atrial fibrillation. He is doing well in clinic.  Reports no sustained episodes of arrhythmia.  Intermittently will have brief palpitations.  No problems with his blood thinner.      Past medical, surgical, social and family history were reviewed.  ROS:   Please see the history of present illness.    All other systems reviewed and are negative.  EKGs/Labs/Other Studies Reviewed:    The following studies were reviewed today:  September 18, 2022 EKG shows sinus rhythm, ventricular rate 54 bpm, QTc 447 ms.    August 17, 2022 creatinine 0.87  EKG:  The ekg ordered today demonstrates sinus bradycardia, ventricular rate 55 bpm, QTc is 432 ms.   Physical Exam:    VS:  BP 118/70   Pulse (!) 55   Ht 6\' 1"  (1.854 m)   Wt 225 lb (102.1 kg)   SpO2 97%   BMI 29.69 kg/m     Wt Readings from Last 3 Encounters:  01/15/23 225 lb (102.1 kg)  09/18/22 223 lb 12.8 oz (101.5 kg)  08/17/22 226 lb 12.8 oz (102.9 kg)     GEN:  Well nourished, well developed in no acute distress CARDIAC: RRR, no murmurs, rubs, gallops RESPIRATORY:  Clear to auscultation without rales, wheezing or rhonchi       ASSESSMENT:    1. Persistent atrial fibrillation (HCC)   2. Encounter for long-term (current) use of high-risk medication   3. Chronic systolic CHF (congestive heart failure) (HCC)   4. NICM (nonischemic  cardiomyopathy) (HCC)    PLAN:    In order of problems listed above:   #Persistent atrial fibrillation #High risk med monitoring-dofetilide Doing well on Tikosyn. QTc on today's EKG stable for continued use Repeat BMP and mag today  #Chronic systolic heart failure #Nonischemic cardiomyopathy NYHA class II.  Warm and dry on exam.  Rhythm control indicated as above. Continue GDMT.  Follow-up 4 months with APP.       Signed, Steffanie Dunn, MD, [facility], [facility] 01/15/2023 9:14 AM    Electrophysiology [facility]

## 2023-01-15 ENCOUNTER — Ambulatory Visit: Payer: Medicare Other | Attending: Cardiology | Admitting: Cardiology

## 2023-01-15 ENCOUNTER — Encounter: Payer: Self-pay | Admitting: Cardiology

## 2023-01-15 VITALS — BP 118/70 | HR 55 | Ht 73.0 in | Wt 225.0 lb

## 2023-01-15 DIAGNOSIS — I428 Other cardiomyopathies: Secondary | ICD-10-CM

## 2023-01-15 DIAGNOSIS — Z79899 Other long term (current) drug therapy: Secondary | ICD-10-CM

## 2023-01-15 DIAGNOSIS — I4819 Other persistent atrial fibrillation: Secondary | ICD-10-CM | POA: Diagnosis not present

## 2023-01-15 DIAGNOSIS — I5022 Chronic systolic (congestive) heart failure: Secondary | ICD-10-CM

## 2023-01-15 NOTE — Patient Instructions (Addendum)
Medication Instructions:  Your physician recommends that you continue on your current medications as directed. Please refer to the Current Medication list given to you today.  *If you need a refill on your cardiac medications before your next appointment, please call your pharmacy*  Labs:  Today: BMET and Mg  Follow-Up: At [facility], you and your health needs are our priority.  As part of our continuing mission to provide you with exceptional heart care, we have created designated Provider Care Teams.  These Care Teams include your primary Cardiologist (physician) and Advanced Practice Providers (APPs -  Physician Assistants and Nurse Practitioners) who all work together to provide you with the care you need, when you need it.  Your next appointment:   4 month(s)  Provider:   You will see one of the following Advanced Practice Providers on your designated Care Team:   Francis Dowse, [place]" [place], [place] Sherie Don, NP

## 2023-01-16 LAB — BASIC METABOLIC PANEL
BUN/Creatinine Ratio: 15 (ref 10–24)
BUN: 12 mg/dL (ref 8–27)
CO2: 25 mmol/L (ref 20–29)
Calcium: 9 mg/dL (ref 8.6–10.2)
Chloride: 102 mmol/L (ref 96–106)
Creatinine, Ser: 0.82 mg/dL (ref 0.76–1.27)
Glucose: 128 mg/dL — ABNORMAL HIGH (ref 70–99)
Potassium: 4.5 mmol/L (ref 3.5–5.2)
Sodium: 138 mmol/L (ref 134–144)
eGFR: 92 mL/min/{1.73_m2} (ref >59)

## 2023-01-16 LAB — MAGNESIUM: Magnesium: 2 mg/dL (ref 1.6–2.3)

## 2023-01-30 ENCOUNTER — Other Ambulatory Visit: Payer: Self-pay | Admitting: Family Medicine

## 2023-03-13 ENCOUNTER — Other Ambulatory Visit: Payer: Self-pay | Admitting: Cardiology

## 2023-04-01 NOTE — Progress Notes (Signed)
Date:  04/05/2023   ID:  Christopher Navarro, DOB 1951/02/19, MRN [id number]  PCP:  Kristian Covey, MD  Cardiologist:  Ellsworth Waldschmidt [place] MD Electrophysiologist:  Hillis Range MD  Chief Complaint: CHF   History of Present Illness:    Christopher Navarro is a 72 y.o. male who is seen for follow up Afib and CHF.   He has a history of WPW and underwent an ablation in 1997 by Dr. Graciela Husbands. He was seen on Jul 20, 2018 with new onset AFib.  He was anticoagulated. Echo showed global HK with EF 40-45%.  He was seen in the Afib clinic and after anticoagulation he underwent DCCV on 08/15/18. He unfortunately had early return of Afib.  He has a history of NIDDM, HLD, and HTN. He also has a family history of CAD with a brother having CABG.   He subsequently underwent [facility] on 09/15/18. 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 Mildly elevated LV filling pressures and pulmonary HTN. Low cardiac output. His Coreg dose was increased. He was switched from lisinopril to [facility] and was also started on Jardiance. He was seen by the Afib clinic and admitted for Tikosyn loading and he had DCCV on 10/20/18. QT remained normal. Subsequent follow up Echo in July 2020 was normal.   In 2021  he developed recurrent Afib despite Tikosyn. He was significantly symptomatic with marked fatigue. He underwent Afib ablation by Dr Johney Frame on 03/26/20.   He had follow up with EP-Dr Lalla Brothers. Felt to be doing well on Tikosyn and therapy continued.   On follow up today he is doing well. No chest pain, dyspnea or edema. Weight is stable.  He has infrequent bouts of Afib. Longest lasting an hour. Happens about once or twice a month. He stays active doing yard work and going to [employer] Involved with a CERT team.      Prior CV studies:   The following studies were reviewed today:  Echo 07/25/18: Study Conclusions   - Left ventricle: The cavity size was normal. There was mild   concentric hypertrophy. Systolic function was mildly to   moderately  reduced. The estimated ejection fraction was in the   range of 40% to 45%. Diffuse hypokinesis. Features are consistent   with a pseudonormal left ventricular filling pattern, with   concomitant abnormal relaxation and increased filling pressure   (grade 2 diastolic dysfunction). Doppler parameters are   consistent with elevated ventricular end-diastolic filling   pressure. - Mitral valve: There was mild regurgitation. - Left atrium: The atrium was moderately dilated. - Right ventricle: The cavity size was normal. Wall thickness was   normal. Systolic function was normal. - Right atrium: The atrium was normal in size. - Tricuspid valve: There was no regurgitation. - Pericardium, extracardiac: There was no pericardial effusion.   Impressions:   - No prior study available for comparison.   Cardiac cath 09/25/18:  RIGHT/LEFT HEART CATH AND CORONARY ANGIOGRAPHY  Conclusion      Ost 1st Diag lesion is 99% stenosed. Ost Cx to Dist Cx lesion is 25% stenosed. There is moderate left ventricular systolic dysfunction. The left ventricular ejection fraction is 35-45% by visual estimate. LV end diastolic pressure is mildly elevated. Hemodynamic findings consistent with mild pulmonary hypertension.   1. Left dominant circulation 2. 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 Global LV dysfunction. EF 40%. 4. Mildly elevated LV filling pressures 5. Mild pulmonary venous HTN 6. Low cardiac output.  Plan: will optimize therapy for CHF. Increase Coreg dose to 25 mg bid as Afib rate is not well controlled. Consider switching lisinopril to Entresto. Consider adding Jardiance for DM. Once CHF medications optimized will plan Tikosyn load and conversion to NSR. OK to resume Eliquis in am.     Echo 02/22/19:  IMPRESSIONS     1. The left ventricle has normal systolic function with an ejection  fraction of 60-65%. The cavity size was normal. Left ventricular diastolic   Doppler parameters are consistent with impaired relaxation.   2. The right ventricle has normal systolic function. The cavity was  normal. There is no increase in right ventricular wall thickness.   3. The aortic valve is tricuspid. Mild thickening of the aortic valve.  Mild calcification of the aortic valve.   10/02/2020: TTE IMPRESSIONS   1. Left ventricular ejection fraction, by estimation, is 55 to 60%. The  left ventricle has normal function. The left ventricle has no regional  wall motion abnormalities.   2. E/A ratio >2.2 which would be consistent with restrictive filling,  however, tissue dopplers normal and no other parameters suggestive of  restrictive process. Suspect impaired relaxation without restriction.   3. Right ventricular systolic function is normal. The right ventricular  size is normal.   4. The mitral valve is normal in structure. Trivial mitral valve  regurgitation.   5. The aortic valve is tricuspid. Aortic valve regurgitation is not  visualized. No aortic stenosis is present.   6. The inferior vena cava is normal in size with greater than 50%  respiratory variability, suggesting right atrial pressure of 3 mmHg.       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   Diabetes mellitus type II    Dyslipidemia    FASCIITIS, PLANTAR 06/20/2009   HYPERTENSION 06/20/2009   Past Surgical History:  Procedure Laterality Date   ablasion  1997   ATRIAL FIBRILLATION ABLATION N/A 03/26/2020   Procedure: ATRIAL FIBRILLATION ABLATION;  Surgeon: Hillis Range, MD;  Location: [facility] INVASIVE CV LAB;  Service: Cardiovascular;  Laterality: N/A;   BACK SURGERY  2006   CARDIOVERSION N/A 08/15/2018   Procedure: CARDIOVERSION;  Surgeon: Pricilla Riffle, MD;  Location: [facility] ENDOSCOPY;  Service: Cardiovascular;  Laterality: N/A;   CARDIOVERSION N/A 10/20/2018   Procedure: CARDIOVERSION;  Surgeon: Jodelle Red, MD;   Location: [facility] ENDOSCOPY;  Service: Cardiovascular;  Laterality: N/A;   CARPAL TUNNEL RELEASE  2004   Bilateral   KNEE ARTHROSCOPY  2002   RIGHT/LEFT HEART CATH AND CORONARY ANGIOGRAPHY N/A 09/15/2018   Procedure: RIGHT/LEFT HEART CATH AND CORONARY ANGIOGRAPHY;  Surgeon: [place], Avari Gelles M, MD;  Location: [facility] INVASIVE CV LAB;  Service: Cardiovascular;  Laterality: N/A;     Current Meds  Medication Sig   acetaminophen (TYLENOL) 500 MG tablet Take 1,000 mg by mouth every 6 (six) hours as needed for mild pain or moderate pain.   carvedilol (COREG) 12.5 MG tablet TAKE 1 TABLET BY MOUTH 2 TIMES DAILY.   dofetilide (TIKOSYN) 500 MCG capsule TAKE 1 CAPSULE BY MOUTH TWICE A DAY   ELIQUIS 5 MG TABS tablet TAKE 1 TABLET BY MOUTH TWICE A DAY   ENTRESTO 24-26 MG TAKE 1 TABLET BY MOUTH TWICE A DAY   JARDIANCE 10 MG TABS tablet TAKE 1 TABLET BY MOUTH EVERY DAY   Magnesium 250 MG TABS Take 250 mg by mouth 3 (three) times a  week.   metFORMIN (GLUCOPHAGE-XR) 750 MG 24 hr tablet TAKE 1 TABLET BY MOUTH EVERY DAY WITH BREAKFAST   Multiple Vitamin (MULTIVITAMIN WITH MINERALS) TABS tablet Take 1 tablet by mouth daily. Senior   rosuvastatin (CRESTOR) 20 MG tablet Take 1 tablet (20 mg total) by mouth daily.     Allergies:   Ciprofloxacin   Social History   Tobacco Use   Smoking status: Former   Smokeless tobacco: Never  Vaping Use   Vaping status: Never Used  Substance Use Topics   Alcohol use: No   Drug use: No     Family Hx: The patient's family history includes Arthritis in an other family member; Hyperlipidemia in an other family member; Hypertension in an other family member; Stroke in his mother.  ROS:   Please see the history of present illness.     All other systems reviewed and are negative.   Labs/Other Tests and Data Reviewed:    Recent Labs: 08/17/2022: ALT 21; Hemoglobin 16.9; Platelets 247.0 01/15/2023: BUN 12; Creatinine, Ser 0.82; Magnesium 2.0; Potassium 4.5; Sodium 138   Recent Lipid  Panel Lab Results  Component Value Date/Time   CHOL 118 08/17/2022 09:04 AM   CHOL 117 09/18/2019 09:01 AM   TRIG 60.0 08/17/2022 09:04 AM   HDL 54.20 08/17/2022 09:04 AM   HDL 53 09/18/2019 09:01 AM   CHOLHDL 2 08/17/2022 09:04 AM   LDLCALC 52 08/17/2022 09:04 AM   LDLCALC 49 09/18/2019 09:01 AM    Wt Readings from Last 3 Encounters:  04/05/23 217 lb (98.4 kg)  01/15/23 225 lb (102.1 kg)  09/18/22 223 lb 12.8 oz (101.5 kg)   Ecg today shows NSR rate 54. QTc 447 msec. Normal. I have personally reviewed and interpreted this study.  Objective:    Vital Signs:  BP 130/68 (BP Location: Left Arm, Patient Position: Sitting, Cuff Size: Normal)   Pulse 71   Ht 6\' 1"  (1.854 m)   Wt 217 lb (98.4 kg)   SpO2 97%   BMI 28.63 kg/m  per patient report  GENERAL:  Well appearing WM  In NAD HEENT:  PERRL, EOMI, sclera are clear. Oropharynx is clear. NECK:  No jugular venous distention, carotid upstroke brisk and symmetric, no bruits, no thyromegaly or adenopathy LUNGS:  Clear to auscultation bilaterally CHEST:  Unremarkable HEART:  RRR,  PMI not displaced or sustained,S1 and S2 within normal limits, no S3, no S4: no clicks, no rubs, no murmurs ABD:  Soft, nontender. BS +, no masses or bruits. No hepatomegaly, no splenomegaly EXT:  2 + pulses throughout, no edema, no cyanosis no clubbing SKIN:  Warm and dry.  No rashes NEURO:  Alert and oriented x 3. Cranial nerves II through XII intact. PSYCH:  Cognitively intact    ASSESSMENT & PLAN:    1.  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 Cardiac cath with nonobstructive CAD. Repeat Echo after restoration of sinus rhythm and optimization of medical therapy shows normalization of LV function. He is asymptomatic. Will continue Coreg, [place], [place].   2. Atrial fibrillation paroxysmal and symptomatic. S/p DCCV with early return of AFib. Then placed on Tikosyn  and repeat DCCV performed  on 10/20/18.  Had recurrent Afib and is now s/p ablation in August 2021. Now well controlled on Tikosyn  3. Remote history of WPW s/p ablation  4. HTN controlled  5. Hypercholesterolemia. Now on Crestor. At goal.   6. DM type 2 on metformin and  Jardiance. Last A1c 8.0%. increased post holidays. Follow up with PCP.   Medication Adjustments/Labs and Tests Ordered: Current medicines are reviewed at length with the patient today.  Concerns regarding medicines are outlined above.  Tests Ordered: none  Medication Changes: No orders of the defined types were placed in this encounter.    Disposition:  Follow up 6 months.  Signed, Malon Siddall [place], MD  04/05/2023 9:08 AM    [facility]

## 2023-04-05 ENCOUNTER — Encounter: Payer: Self-pay | Admitting: Cardiology

## 2023-04-05 ENCOUNTER — Ambulatory Visit: Payer: Medicare Other | Attending: Cardiology | Admitting: Cardiology

## 2023-04-05 VITALS — BP 130/68 | HR 71 | Ht 73.0 in | Wt 217.0 lb

## 2023-04-05 DIAGNOSIS — I5022 Chronic systolic (congestive) heart failure: Secondary | ICD-10-CM

## 2023-04-05 DIAGNOSIS — I2510 Atherosclerotic heart disease of native coronary artery without angina pectoris: Secondary | ICD-10-CM | POA: Diagnosis not present

## 2023-04-05 DIAGNOSIS — I10 Essential (primary) hypertension: Secondary | ICD-10-CM

## 2023-04-05 DIAGNOSIS — I480 Paroxysmal atrial fibrillation: Secondary | ICD-10-CM | POA: Diagnosis not present

## 2023-04-05 NOTE — Patient Instructions (Signed)
Medication Instructions:  Your physician recommends that you continue on your current medications as directed. Please refer to the Current Medication list given to you today.  *If you need a refill on your cardiac medications before your next appointment, please call your pharmacy*   Follow-Up: At [facility], you and your health needs are our priority.  As part of our continuing mission to provide you with exceptional heart care, we have created designated Provider Care Teams.  These Care Teams include your primary Cardiologist (physician) and Advanced Practice Providers (APPs -  Physician Assistants and Nurse Practitioners) who all work together to provide you with the care you need, when you need it.  We recommend signing up for the patient portal called "MyChart".  Sign up information is provided on this After Visit Summary.  MyChart is used to connect with patients for Virtual Visits (Telemedicine).  Patients are able to view lab/test results, encounter notes, upcoming appointments, etc.  Non-urgent messages can be sent to your provider as well.   To learn more about what you can do with MyChart, go to [email or web address].    Your next appointment:   6 month(s)  Provider:   Peter [name], MD

## 2023-04-13 ENCOUNTER — Other Ambulatory Visit: Payer: Self-pay | Admitting: Cardiology

## 2023-05-01 ENCOUNTER — Other Ambulatory Visit: Payer: Self-pay | Admitting: Family Medicine

## 2023-05-07 ENCOUNTER — Other Ambulatory Visit: Payer: Self-pay | Admitting: Cardiology

## 2023-05-07 DIAGNOSIS — I4819 Other persistent atrial fibrillation: Secondary | ICD-10-CM

## 2023-05-07 NOTE — Telephone Encounter (Signed)
Eliquis 5mg  refill request received. Patient is 72 years old, weight-98.4kg, Crea-0.82 on 01/15/23, Diagnosis-Afib, and last seen by Dr. Sw[place] on 04/05/23. Dose is appropriate based on dosing criteria. Will send in refill to requested pharmacy.

## 2023-05-17 NOTE — Progress Notes (Unsigned)
Cardiology Office Note:  .   Date:  05/17/2023  ID:  Christopher Navarro, DOB May 06, 1951, MRN [id number] PCP: Kristian Covey, MD  [facility] Providers Cardiologist:  [name], MD Electrophysiologist:  Lanier Prude, MD {  History of Present Illness: .   Christopher Navarro is a 72 y.o. male w/PMHx of HTN, DM,  Obesity, CAD, CM (felt to be out pf proportion to his CAD, suspect 2/2 AFib/tachycardia), and persistent AFib.   He saw Dr. Lalla Brothers 01/15/23, doing well, intermittent brief palpitations only, stable Qtc, volume stable No changes made  He saw Dr. [place], longest episode of palpitations about an hour, was active, going to the gym, doing yard work. He mentions hx of WPW ablated No changes were made.   Today's visit is scheduled as a 4 mo tikosyn visit  ROS: ***  *** tikosyn EKG, meds, dose, labs *** eliquis, dose, labs, bleeding *** symptoms *** volume  Afib hx Diagnosed 2019 PVI ablation 03/26/2020   AAD Hx Tikosyn started March 2020 >> is current   Studies Reviewed: Marland Kitchen    EKG done today and reviewed by myself:  ***   10/02/2020: TTE IMPRESSIONS   1. Left ventricular ejection fraction, by estimation, is 55 to 60%. The  left ventricle has normal function. The left ventricle has no regional  wall motion abnormalities.   2. E/A ratio >2.2 which would be consistent with restrictive filling,  however, tissue dopplers normal and no other parameters suggestive of  restrictive process. Suspect impaired relaxation without restriction.   3. Right ventricular systolic function is normal. The right ventricular  size is normal.   4. The mitral valve is normal in structure. Trivial mitral valve  regurgitation.   5. The aortic valve is tricuspid. Aortic valve regurgitation is not  visualized. No aortic stenosis is present.   6. The inferior vena cava is normal in size with greater than 50%  respiratory variability, suggesting right atrial pressure of 3 mmHg.     Cardiac cath 09/25/18:  RIGHT/LEFT HEART CATH AND CORONARY ANGIOGRAPHY  Conclusion      Ost 1st Diag lesion is 99% stenosed. Ost Cx to Dist Cx lesion is 25% stenosed. There is moderate left ventricular systolic dysfunction. The left ventricular ejection fraction is 35-45% by visual estimate. LV end diastolic pressure is mildly elevated. Hemodynamic findings consistent with mild pulmonary hypertension.   1. Left dominant circulation 2. 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 Global LV dysfunction. EF 40%. 4. Mildly elevated LV filling pressures 5. Mild pulmonary venous HTN 6. Low cardiac output.    Plan: will optimize therapy for CHF. Increase Coreg dose to 25 mg bid as Afib rate is not well controlled. Consider switching lisinopril to Entresto. Consider adding Jardiance for DM. Once CHF medications optimized will plan Tikosyn load and conversion to NSR. OK to resume Eliquis in am.     Risk Assessment/Calculations:    Physical Exam:   VS:  There were no vitals taken for this visit.   Wt Readings from Last 3 Encounters:  04/05/23 217 lb (98.4 kg)  01/15/23 225 lb (102.1 kg)  09/18/22 223 lb 12.8 oz (101.5 kg)    GEN: Well nourished, well developed in no acute distress NECK: No JVD; No carotid bruits CARDIAC: ***RRR, no murmurs, rubs, gallops RESPIRATORY:  *** CTA b/l without rales, wheezing or rhonchi  ABDOMEN: Soft, non-tender, non-distended EXTREMITIES:  No edema; No deformity    ASSESSMENT AND  PLAN: .    paroxysmal AFib CHA2DS2Vasc is 5, on Xarelto, *** appropriately dosed Tikosyn w/*** QTc *** burden by symptoms  NICM Recovered LVEF ***  HTN ***  CAD ***  Secondary hypercoagulable state 2/2 AFib     {Are you ordering a CV Procedure (e.g. stress test, cath, DCCV, TEE, etc)?   Press F2        :[id number]}     Dispo: ***  Signed, Sheilah Pigeon, PA-C

## 2023-05-19 ENCOUNTER — Ambulatory Visit: Payer: Medicare Other | Attending: Physician Assistant | Admitting: Physician Assistant

## 2023-05-19 ENCOUNTER — Encounter: Payer: Self-pay | Admitting: Physician Assistant

## 2023-05-19 VITALS — BP 104/66 | HR 55 | Ht 73.0 in | Wt 221.0 lb

## 2023-05-19 DIAGNOSIS — I5022 Chronic systolic (congestive) heart failure: Secondary | ICD-10-CM

## 2023-05-19 DIAGNOSIS — Z79899 Other long term (current) drug therapy: Secondary | ICD-10-CM | POA: Diagnosis not present

## 2023-05-19 DIAGNOSIS — D6869 Other thrombophilia: Secondary | ICD-10-CM

## 2023-05-19 DIAGNOSIS — Z5181 Encounter for therapeutic drug level monitoring: Secondary | ICD-10-CM | POA: Diagnosis not present

## 2023-05-19 DIAGNOSIS — I480 Paroxysmal atrial fibrillation: Secondary | ICD-10-CM

## 2023-05-19 DIAGNOSIS — I2510 Atherosclerotic heart disease of native coronary artery without angina pectoris: Secondary | ICD-10-CM

## 2023-05-19 DIAGNOSIS — I428 Other cardiomyopathies: Secondary | ICD-10-CM

## 2023-05-19 NOTE — Patient Instructions (Addendum)
Medication Instructions:   Your physician recommends that you continue on your current medications as directed. Please refer to the Current Medication list given to you today.  *If you need a refill on your cardiac medications before your next appointment, please call your pharmacy*   Lab Work:  BMET MAG AND CBC TODAY   If you have labs (blood work) drawn today and your tests are completely normal, you will receive your results only by: MyChart Message (if you have MyChart) OR A paper copy in the mail If you have any lab test that is abnormal or we need to change your treatment, we will call you to review the results.   Testing/Procedures: NONE ORDERED  TODAY    Follow-Up: At [facility], you and your health needs are our priority.  As part of our continuing mission to provide you with exceptional heart care, we have created designated Provider Care Teams.  These Care Teams include your primary Cardiologist (physician) and Advanced Practice Providers (APPs -  Physician Assistants and Nurse Practitioners) who all work together to provide you with the care you need, when you need it.  We recommend signing up for the patient portal called "MyChart".  Sign up information is provided on this After Visit Summary.  MyChart is used to connect with patients for Virtual Visits (Telemedicine).  Patients are able to view lab/test results, encounter notes, upcoming appointments, etc.  Non-urgent messages can be sent to your provider as well.   To learn more about what you can do with MyChart, go to [email or web address].    Your next appointment:   4 month(s)  Provider:   You may see Lanier Prude, MD or one of the following Advanced Practice Providers on your designated Care Team:   Francis Dowse, [place]  Other Instructions

## 2023-05-20 LAB — CBC
Hematocrit: 50.4 % (ref 37.5–51.0)
Hemoglobin: 15.7 g/dL (ref 13.0–17.7)
MCH: 26.4 pg — ABNORMAL LOW (ref 26.6–33.0)
MCHC: 31.2 g/dL — ABNORMAL LOW (ref 31.5–35.7)
MCV: 85 fL (ref 79–97)
Platelets: 268 10*3/uL (ref 150–450)
RBC: 5.94 x10E6/uL — ABNORMAL HIGH (ref 4.14–5.80)
RDW: 13.8 % (ref 11.6–15.4)
WBC: 6.6 10*3/uL (ref 3.4–10.8)

## 2023-05-20 LAB — BASIC METABOLIC PANEL
BUN/Creatinine Ratio: 18 (ref 10–24)
BUN: 15 mg/dL (ref 8–27)
CO2: 23 mmol/L (ref 20–29)
Calcium: 9.1 mg/dL (ref 8.6–10.2)
Chloride: 103 mmol/L (ref 96–106)
Creatinine, Ser: 0.84 mg/dL (ref 0.76–1.27)
Glucose: 116 mg/dL — ABNORMAL HIGH (ref 70–99)
Potassium: 4.6 mmol/L (ref 3.5–5.2)
Sodium: 142 mmol/L (ref 134–144)
eGFR: 92 mL/min/{1.73_m2} (ref >59)

## 2023-05-20 LAB — MAGNESIUM: Magnesium: 2.3 mg/dL (ref 1.6–2.3)

## 2023-07-30 ENCOUNTER — Other Ambulatory Visit: Payer: Self-pay | Admitting: Family Medicine

## 2023-09-10 ENCOUNTER — Other Ambulatory Visit: Payer: Self-pay | Admitting: Cardiology

## 2023-09-30 ENCOUNTER — Ambulatory Visit: Payer: Medicare Other | Admitting: Physician Assistant

## 2023-10-01 NOTE — Progress Notes (Signed)
 Date:  10/05/2023   ID:  Christopher Navarro, DOB 1951/03/20, MRN [id number]  PCP:  Christopher Covey, MD  Cardiologist:  Noelle Sease [place] MD Electrophysiologist:  Steffanie Dunn MD  Chief Complaint: CHF   History of Present Illness:    Christopher Navarro is a 73 y.o. male who is seen for follow up Afib and CHF.   He has a history of WPW and underwent an ablation in 1997 by Dr. Graciela Husbands. He was seen on Jul 20, 2018 with new onset AFib.  He was anticoagulated. Echo showed global HK with EF 40-45%.  He was seen in the Afib clinic and after anticoagulation he underwent DCCV on 08/15/18. He unfortunately had early return of Afib.  He has a history of NIDDM, HLD, and HTN. He also has a family history of CAD with a brother having CABG.   He subsequently underwent [facility] on 09/15/18. 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 Mildly elevated LV filling pressures and pulmonary HTN. Low cardiac output. His Coreg dose was increased. He was switched from lisinopril to [facility] and was also started on Jardiance. He was seen by the Afib clinic and admitted for Tikosyn loading and he had DCCV on 10/20/18. QT remained normal. Subsequent follow up Echo in July 2020 was normal.   In 2021  he developed recurrent Afib despite Tikosyn. He was significantly symptomatic with marked fatigue. He underwent Afib ablation by Dr Johney Frame on 03/26/20.   On follow up today he is doing well. Denies any chest pain, edema or dyspnea. He is active. Still has some Afib. Over the past month about once a week. On Saturday it lasted 4 hours. Just feels wiped out after.     Prior CV studies:   The following studies were reviewed today:  Echo 07/25/18: Study Conclusions   - Left ventricle: The cavity size was normal. There was mild   concentric hypertrophy. Systolic function was mildly to   moderately reduced. The estimated ejection fraction was in the   range of 40% to 45%. Diffuse hypokinesis. Features are consistent   with a pseudonormal left  ventricular filling pattern, with   concomitant abnormal relaxation and increased filling pressure   (grade 2 diastolic dysfunction). Doppler parameters are   consistent with elevated ventricular end-diastolic filling   pressure. - Mitral valve: There was mild regurgitation. - Left atrium: The atrium was moderately dilated. - Right ventricle: The cavity size was normal. Wall thickness was   normal. Systolic function was normal. - Right atrium: The atrium was normal in size. - Tricuspid valve: There was no regurgitation. - Pericardium, extracardiac: There was no pericardial effusion.   Impressions:   - No prior study available for comparison.   Cardiac cath 09/25/18:  RIGHT/LEFT HEART CATH AND CORONARY ANGIOGRAPHY  Conclusion      Ost 1st Diag lesion is 99% stenosed. Ost Cx to Dist Cx lesion is 25% stenosed. There is moderate left ventricular systolic dysfunction. The left ventricular ejection fraction is 35-45% by visual estimate. LV end diastolic pressure is mildly elevated. Hemodynamic findings consistent with mild pulmonary hypertension.   1. Left dominant circulation 2. 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 Global LV dysfunction. EF 40%. 4. Mildly elevated LV filling pressures 5. Mild pulmonary venous HTN 6. Low cardiac output.    Plan: will optimize therapy for CHF. Increase Coreg dose to 25 mg bid as Afib rate is not well controlled. Consider switching lisinopril to Entresto. Consider adding  Jardiance for DM. Once CHF medications optimized will plan Tikosyn load and conversion to NSR. OK to resume Eliquis in am.     Echo 02/22/19:  IMPRESSIONS     1. The left ventricle has normal systolic function with an ejection  fraction of 60-65%. The cavity size was normal. Left ventricular diastolic  Doppler parameters are consistent with impaired relaxation.   2. The right ventricle has normal systolic function. The cavity was  normal. There is no  increase in right ventricular wall thickness.   3. The aortic valve is tricuspid. Mild thickening of the aortic valve.  Mild calcification of the aortic valve.   10/02/2020: TTE IMPRESSIONS   1. Left ventricular ejection fraction, by estimation, is 55 to 60%. The  left ventricle has normal function. The left ventricle has no regional  wall motion abnormalities.   2. E/A ratio >2.2 which would be consistent with restrictive filling,  however, tissue dopplers normal and no other parameters suggestive of  restrictive process. Suspect impaired relaxation without restriction.   3. Right ventricular systolic function is normal. The right ventricular  size is normal.   4. The mitral valve is normal in structure. Trivial mitral valve  regurgitation.   5. The aortic valve is tricuspid. Aortic valve regurgitation is not  visualized. No aortic stenosis is present.   6. The inferior vena cava is normal in size with greater than 50%  respiratory variability, suggesting right atrial pressure of 3 mmHg.       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   Diabetes mellitus type II    Dyslipidemia    FASCIITIS, PLANTAR 06/20/2009   HYPERTENSION 06/20/2009   Past Surgical History:  Procedure Laterality Date   ablasion  1997   ATRIAL FIBRILLATION ABLATION N/A 03/26/2020   Procedure: ATRIAL FIBRILLATION ABLATION;  Surgeon: Hillis Range, MD;  Location: [facility] INVASIVE CV LAB;  Service: Cardiovascular;  Laterality: N/A;   BACK SURGERY  2006   CARDIOVERSION N/A 08/15/2018   Procedure: CARDIOVERSION;  Surgeon: Pricilla Riffle, MD;  Location[facility] ENDOSCOPY;  Service: Cardiovascular;  Laterality: N/A;   CARDIOVERSION N/A 10/20/2018   Procedure: CARDIOVERSION;  Surgeon: Jodelle Red, MD;  Location: [facility] ENDOSCOPY;  Service: Cardiovascular;  Laterality: N/A;   CARPAL TUNNEL RELEASE  2004   Bilateral   KNEE ARTHROSCOPY  2002   RIGHT/LEFT  HEART CATH AND CORONARY ANGIOGRAPHY N/A 09/15/2018   Procedure: RIGHT/LEFT HEART CATH AND CORONARY ANGIOGRAPHY;  Surgeon: [place], Berdina Cheever M, MD;  Location: [facility] INVASIVE CV LAB;  Service: Cardiovascular;  Laterality: N/A;     Current Meds  Medication Sig   acetaminophen (TYLENOL) 500 MG tablet Take 1,000 mg by mouth every 6 (six) hours as needed for mild pain or moderate pain.   carvedilol (COREG) 12.5 MG tablet TAKE 1 TABLET BY MOUTH TWICE A DAY   dofetilide (TIKOSYN) 500 MCG capsule TAKE 1 CAPSULE BY MOUTH TWICE A DAY   ELIQUIS 5 MG TABS tablet TAKE 1 TABLET BY MOUTH TWICE A DAY   ENTRESTO 24-26 MG TAKE 1 TABLET BY MOUTH TWICE A DAY   JARDIANCE 10 MG TABS tablet TAKE 1 TABLET BY MOUTH EVERY DAY   Magnesium 250 MG TABS Take 250 mg by mouth 3 (three) times a week.   metFORMIN (GLUCOPHAGE-XR) 750 MG 24 hr tablet TAKE 1 TABLET BY MOUTH EVERY DAY WITH BREAKFAST   Multiple Vitamin (MULTIVITAMIN WITH MINERALS) TABS  tablet Take 1 tablet by mouth daily. Senior   rosuvastatin (CRESTOR) 20 MG tablet Take 1 tablet (20 mg total) by mouth daily.     Allergies:   Ciprofloxacin   Social History   Tobacco Use   Smoking status: Former   Smokeless tobacco: Never  Vaping Use   Vaping status: Never Used  Substance Use Topics   Alcohol use: No   Drug use: No     Family Hx: The patient's family history includes Arthritis in an other family member; Hyperlipidemia in an other family member; Hypertension in an other family member; Stroke in his mother.  ROS:   Please see the history of present illness.     All other systems reviewed and are negative.   Labs/Other Tests and Data Reviewed:    Recent Labs: 05/19/2023: BUN 15; Creatinine, Ser 0.84; Hemoglobin 15.7; Magnesium 2.3; Platelets 268; Potassium 4.6; Sodium 142   Recent Lipid Panel Lab Results  Component Value Date/Time   CHOL 118 08/17/2022 09:04 AM   CHOL 117 09/18/2019 09:01 AM   TRIG 60.0 08/17/2022 09:04 AM   HDL 54.20 08/17/2022 09:04  AM   HDL 53 09/18/2019 09:01 AM   CHOLHDL 2 08/17/2022 09:04 AM   LDLCALC 52 08/17/2022 09:04 AM   LDLCALC 49 09/18/2019 09:01 AM    Wt Readings from Last 3 Encounters:  10/05/23 222 lb 9.6 oz (101 kg)  05/19/23 221 lb (100.2 kg)  04/05/23 217 lb (98.4 kg)    Objective:    Vital Signs:  BP 128/68 (BP Location: Left Arm, Patient Position: Sitting, Cuff Size: Large)   Pulse (!) 52   Ht 6\' 1"  (1.854 m)   Wt 222 lb 9.6 oz (101 kg)   SpO2 93%   BMI 29.37 kg/m  per patient report  GENERAL:  Well appearing WM  In NAD HEENT:  PERRL, EOMI, sclera are clear. Oropharynx is clear. NECK:  No jugular venous distention, carotid upstroke brisk and symmetric, no bruits, no thyromegaly or adenopathy LUNGS:  Clear to auscultation bilaterally CHEST:  Unremarkable HEART:  RRR,  PMI not displaced or sustained,S1 and S2 within normal limits, no S3, no S4: no clicks, no rubs, no murmurs ABD:  Soft, nontender. BS +, no masses or bruits. No hepatomegaly, no splenomegaly EXT:  2 + pulses throughout, no edema, no cyanosis no clubbing SKIN:  Warm and dry.  No rashes NEURO:  Alert and oriented x 3. Cranial nerves II through XII intact. PSYCH:  Cognitively intact    ASSESSMENT & PLAN:    1.  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 Cardiac cath with nonobstructive CAD. Repeat Echo after restoration of sinus rhythm and optimization of medical therapy shows normalization of LV function. He remains asymptomatic. Continue current meds  2. Atrial fibrillation paroxysmal and symptomatic. S/p DCCV with early return of AFib. Then placed on Tikosyn  and repeat DCCV performed on 10/20/18.  Had recurrent Afib and is now s/p ablation in August 2021. Continue Tikosyn. Has some breakthrough. Is scheduled to be seen in Afib clinic in about a week  3. Remote history of WPW s/p ablation  4. HTN controlled  5. Hypercholesterolemia. Now on Crestor. Due for follow up  labs with PCP  6. DM type 2 on metformin and Jardiance. Last A1c 8.0%. due for follow up with Dr Caryl Never   Medication Adjustments/Labs and Tests Ordered: Current medicines are reviewed at length with the patient today.  Concerns regarding medicines  are outlined above.  Tests Ordered: none  Medication Changes: No orders of the defined types were placed in this encounter.    Disposition:  Follow up 6 months.  Signed, Sonal Dorwart [place], MD  10/05/2023 3:26 PM    [facility]

## 2023-10-05 ENCOUNTER — Encounter: Payer: Self-pay | Admitting: Cardiology

## 2023-10-05 ENCOUNTER — Ambulatory Visit: Payer: Medicare Other | Attending: Cardiology | Admitting: Cardiology

## 2023-10-05 VITALS — BP 128/68 | HR 52 | Ht 73.0 in | Wt 222.6 lb

## 2023-10-05 DIAGNOSIS — I428 Other cardiomyopathies: Secondary | ICD-10-CM | POA: Diagnosis not present

## 2023-10-05 DIAGNOSIS — I480 Paroxysmal atrial fibrillation: Secondary | ICD-10-CM

## 2023-10-05 DIAGNOSIS — I5022 Chronic systolic (congestive) heart failure: Secondary | ICD-10-CM

## 2023-10-05 DIAGNOSIS — I10 Essential (primary) hypertension: Secondary | ICD-10-CM | POA: Diagnosis not present

## 2023-10-05 NOTE — Patient Instructions (Signed)
 Medication Instructions:  Continue same medications *If you need a refill on your cardiac medications before your next appointment, please call your pharmacy*   Lab Work: None ordered   Testing/Procedures: None ordered   Follow-Up: At [facility], you and your health needs are our priority.  As part of our continuing mission to provide you with exceptional heart care, we have created designated Provider Care Teams.  These Care Teams include your primary Cardiologist (physician) and Advanced Practice Providers (APPs -  Physician Assistants and Nurse Practitioners) who all work together to provide you with the care you need, when you need it.  We recommend signing up for the patient portal called "MyChart".  Sign up information is provided on this After Visit Summary.  MyChart is used to connect with patients for Virtual Visits (Telemedicine).  Patients are able to view lab/test results, encounter notes, upcoming appointments, etc.  Non-urgent messages can be sent to your provider as well.   To learn more about what you can do with MyChart, go to [email or web address].    Your next appointment:  6 months   Call in April to schedule August appointment     Provider:  Dr.Jordan

## 2023-10-09 ENCOUNTER — Other Ambulatory Visit: Payer: Self-pay | Admitting: Cardiology
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
## 2023-10-18 ENCOUNTER — Encounter: Payer: Self-pay | Admitting: Physician Assistant

## 2023-10-18 ENCOUNTER — Ambulatory Visit: Payer: Medicare Other | Attending: Physician Assistant | Admitting: Physician Assistant

## 2023-10-18 VITALS — BP 152/78 | HR 54 | Ht 73.0 in | Wt 226.0 lb

## 2023-10-18 DIAGNOSIS — I480 Paroxysmal atrial fibrillation: Secondary | ICD-10-CM | POA: Diagnosis not present

## 2023-10-18 DIAGNOSIS — Z79899 Other long term (current) drug therapy: Secondary | ICD-10-CM | POA: Diagnosis not present

## 2023-10-18 DIAGNOSIS — D6869 Other thrombophilia: Secondary | ICD-10-CM

## 2023-10-18 DIAGNOSIS — I428 Other cardiomyopathies: Secondary | ICD-10-CM

## 2023-10-18 DIAGNOSIS — I2510 Atherosclerotic heart disease of native coronary artery without angina pectoris: Secondary | ICD-10-CM

## 2023-10-18 LAB — CBC

## 2023-10-18 NOTE — Patient Instructions (Signed)
 Medication Instructions:    Your physician recommends that you continue on your current medications as directed. Please refer to the Current Medication list given to you today.   *If you need a refill on your cardiac medications before your next appointment, please call your pharmacy*   Lab Work:   PLEASE GO DOWN STAIRS  [facility]  FIRST FLOOR  [address] ( GET OFF ELEVATORS MAKE A LEFT AND ANOTHER LEFT LAB ON RIGHT DOWN HALL[address] :  BMET MAG AND CBC TODAY     If you have labs (blood work) drawn today and your tests are completely normal, you will receive your results only by: MyChart Message (if you have MyChart) OR A paper copy in the mail If you have any lab test that is abnormal or we need to change your treatment, we will call you to review the results.   Testing/Procedures: NONE ORDERED  TODAY      Follow-Up: At [facility], you and your health needs are our priority.  As part of our continuing mission to provide you with exceptional heart care, we have created designated Provider Care Teams.  These Care Teams include your primary Cardiologist (physician) and Advanced Practice Providers (APPs -  Physician Assistants and Nurse Practitioners) who all work together to provide you with the care you need, when you need it.  We recommend signing up for the patient portal called "MyChart".  Sign up information is provided on this After Visit Summary.  MyChart is used to connect with patients for Virtual Visits (Telemedicine).  Patients are able to view lab/test results, encounter notes, upcoming appointments, etc.  Non-urgent messages can be sent to your provider as well.   To learn more about what you can do with MyChart, go to [email or web address].    Your next appointment:    4 month(s)  ( CONTACT  CASSIE HALL/ ANGELINE HAMMER FOR EP SCHEDULING ISSUES )   Provider:    Steffanie Dunn, MD or Francis Dowse, PA-C     Other Instructions   1st Floor: - Lobby -  Registration  - Pharmacy  - Lab - Cafe  2nd Floor: - PV Lab - Diagnostic Testing (echo, CT, nuclear med)  3rd Floor: - Vacant  4th Floor: - TCTS (cardiothoracic surgery) - AFib Clinic - Structural Heart Clinic - Vascular Surgery  - Vascular Ultrasound  5th Floor: - [facility]Care Cardiology (general and EP) - Clinical Pharmacy for coumadin, hypertension, lipid, weight-loss medications, and med management appointments    Valet parking services will be available as well.

## 2023-10-19 LAB — BASIC METABOLIC PANEL
BUN/Creatinine Ratio: 13 (ref 10–24)
BUN: 13 mg/dL (ref 8–27)
CO2: 26 mmol/L (ref 20–29)
Calcium: 9.2 mg/dL (ref 8.6–10.2)
Chloride: 101 mmol/L (ref 96–106)
Creatinine, Ser: 1.01 mg/dL (ref 0.76–1.27)
Glucose: 162 mg/dL — ABNORMAL HIGH (ref 70–99)
Potassium: 4.7 mmol/L (ref 3.5–5.2)
Sodium: 139 mmol/L (ref 134–144)
eGFR: 78 mL/min/{1.73_m2} (ref >59)

## 2023-10-19 LAB — MAGNESIUM: Magnesium: 2.2 mg/dL (ref 1.6–2.3)

## 2023-10-19 LAB — CBC
Hematocrit: 53 % — ABNORMAL HIGH (ref 37.5–51.0)
Hemoglobin: 17.2 g/dL (ref 13.0–17.7)
MCH: 26.7 pg (ref 26.6–33.0)
MCHC: 32.5 g/dL (ref 31.5–35.7)
MCV: 82 fL (ref 79–97)
Platelets: 290 10*3/uL (ref 150–450)
RBC: 6.44 x10E6/uL — ABNORMAL HIGH (ref 4.14–5.80)
RDW: 13.9 % (ref 11.6–15.4)
WBC: 7.3 10*3/uL (ref 3.4–10.8)

## 2023-10-27 ENCOUNTER — Other Ambulatory Visit: Payer: Self-pay | Admitting: Family Medicine

## 2023-10-27 ENCOUNTER — Other Ambulatory Visit: Payer: Self-pay | Admitting: Cardiology

## 2023-10-31 ENCOUNTER — Other Ambulatory Visit: Payer: Self-pay | Admitting: Cardiology

## 2023-10-31 DIAGNOSIS — I4819 Other persistent atrial fibrillation: Secondary | ICD-10-CM

## 2023-11-01 NOTE — Telephone Encounter (Signed)
 Prescription refill request for Eliquis received. Indication: PAF Last office visit: 10/18/23  R Keitha Butte PA-C Scr: 1.01 on 10/18/23  Epic Age: 73 Weight: 102.5kg  Based on above findings Eliquis 5mg  twice daily is the appropriate dose.  Refill approved.

## 2023-11-19 ENCOUNTER — Other Ambulatory Visit: Payer: Self-pay | Admitting: Family Medicine

## 2023-11-23 ENCOUNTER — Telehealth: Payer: Self-pay | Admitting: Cardiology

## 2023-11-23 NOTE — Progress Notes (Unsigned)
 Electrophysiology Clinic Note    Date:  11/24/2023  Patient ID:  Thimothy, Barretta 16-Jan-1951, MRN [id number] PCP:  Kristian Covey, MD  Cardiologist:  [name], MD Electrophysiologist: Lanier Prude, MD   Discussed the use of AI scribe software for clinical note transcription with the patient, who gave verbal consent to proceed.   Patient Profile    Chief Complaint: abd discomfort and AFib  History of Present Illness: BURDETTE FOREHAND is a 73 y.o. male with PMH notable for persis AFib, CAD, cardiomyopathy (thought to be out of proprotion to his CAD), HTN, T2DM; seen today for Lanier Prude, MD for acute visit due to AFib.   He is s/p AF ablation w PVI, posterior wall in 2021 by Dr. Johney Frame.  He is s/p WPW ablation 1997.   He last saw PA Ursuy 10/2023, was having a slight up-tick in AF burden - 2-3h long episodes about 1/week. EKG stable on tikosyn.  He called clinic yesterday with concerns of increased AF burden with abd distension.  On follow-up today, patient has been experiencing pressure build-up, particularly before AFib episodes, and has difficulty swallowing. The patient describes the pressure as constant and has noticed that it increases when lying down at night, causing discomfort in the abdomen. The patient has also been experiencing decreased energy levels and interrupted sleep. The patient has attempted dietary changes to alleviate the symptoms but has had limited success. The patient has also noticed a decrease in appetite and has been eating less, leading to weight loss. He denies lower extremity edema or SOB. Remains active going to the gym several days a week, no decreased exercise tolerance when he is not in AFib.   He denies chest pain, chest pressure, dizziness, LH.   He continues to take tikosyn and eliquis BID, no missed doses. No bleeding concerns.    Arrhythmia/Device History  Tikosyn - loaded 2020      ROS:  Please see the history of  present illness. All other systems are reviewed and otherwise negative.    Physical Exam    VS:  BP 124/62 (BP Location: Left Arm, Patient Position: Sitting)   Pulse 70   Ht 6\' 1"  (1.854 m)   Wt 218 lb (98.9 kg)   SpO2 98%   BMI 28.76 kg/m  BMI: Body mass index is 28.76 kg/m.  Wt Readings from Last 3 Encounters:  11/24/23 218 lb (98.9 kg)  10/18/23 226 lb (102.5 kg)  10/05/23 222 lb 9.6 oz (101 kg)     GEN- The patient is well appearing, alert and oriented x 3 today.   Lungs- Clear to ausculation bilaterally, normal work of breathing.  Heart- Regular rate and rhythm, no murmurs, rubs or gallops Extremities- No peripheral edema, warm, dry    Studies Reviewed   Previous EP, cardiology notes.    EKG is ordered. Personal review of EKG from today shows:    EKG Interpretation Date/Time:  Wednesday November 24 2023 09:45:09 EDT Ventricular Rate:  70 PR Interval:  158 QRS Duration:  86 QT Interval:  426 QTC Calculation: 460 R Axis:   66  Text Interpretation: Normal sinus rhythm with sinus arrhythmia Normal ECG When compared with ECG of 18-Oct-2023 07:58, No significant change was found Confirmed by Sherie Don [phone]) on 11/24/2023 9:48:29 AM    10/18/2023 EKG - SB at 54, QT 438 / QTC 415    TTE, 10/02/2020  1. Left ventricular ejection fraction, by estimation,  is 55 to 60%. The left ventricle has normal function. The left ventricle has no regional wall motion abnormalities.   2. E/A ratio >2.2 which would be consistent with restrictive filling, however, tissue dopplers normal and no other parameters suggestive of  restrictive process. Suspect impaired relaxation without restriction.   3. Right ventricular systolic function is normal. The right ventricular size is normal.   4. The mitral valve is normal in structure. Trivial mitral valve regurgitation.   5. The aortic valve is tricuspid. Aortic valve regurgitation is not visualized. No aortic stenosis is present.   6. The  inferior vena cava is normal in size with greater than 50% respiratory variability, suggesting right atrial pressure of 3 mmHg.   Comparison(s): No significant change from prior study.    Assessment and Plan     #) parox AFib #) tikosyn monitoring #) GI discomfort Increased burden by patient symptoms Diligently takes tikosyn 500mcg BID QTC slightly prolonged compared to previous, but remains acceptable Update BMP, Mag today 1 week monitor to eval AFib burden, encouraged him to press button during GI symptoms to correlate symptoms with arrhythmia Start OTC PPI x 1 month to see if relieves symptoms We briefly talked about redo ablation, will defer final decision to MD   #) Hypercoag d/t parox afib CHA2DS2-VASc Score = at least 5 [CHF History: 1, HTN History: 1, Diabetes History: 1, Stroke History: 0, Vascular Disease History: 0, Age Score: 2, Gender Score: 0].  Therefore, the patient's Stroke ppx - 5mg  eliquis BID, appropriately dosed No bleeding concerns    #) NICM NYHA II symptoms Warm and dry on exam On BB, entresto, jardiance Follows with Dr. Swaziland        Current medicines are reviewed at length with the patient today.   The patient does not have concerns regarding his medicines.  The following changes were made today:  none  Labs/ tests ordered today include:  Orders Placed This Encounter  Procedures   Basic metabolic panel with GFR   Magnesium   LONG TERM MONITOR (3-14 DAYS)   EKG 12-Lead     Disposition: Follow up with Dr. Marven Slimmer in  4-6 weeks    Signed, Adaline Holly, NP  11/24/23  10:37 AM  Electrophysiology [facility]

## 2023-11-23 NOTE — Telephone Encounter (Signed)
**Note De-identified  [place]bright Obfuscation** Please advise 

## 2023-11-23 NOTE — Telephone Encounter (Signed)
 Patient c/o Palpitations: STAT if patient c/o lightheadedness, shortness of breath, or chest pain  How long have you had palpitations/irregular HR/ Afib? Are you having the symptoms now? "My records would answer your question1 and 3"  Are you currently experiencing lightheadedness, SOB or CP? "Some lightheadedness and chest discomfort when in Afib"  Do you have a history of afib (atrial fibrillation) or irregular heart rhythm? "My records would answer your question1 and 3"  Have you checked your BP or HR? (document readings if available): "HR is in the 50's in Afib rate is in 112 to 120 range"  Are you experiencing any other symptoms? "What is new is the gastric distension that starts the occurrence of Afib"  **Answered from pt messages**

## 2023-11-23 NOTE — Telephone Encounter (Signed)
 Spoke to patient and he reports that he has been going in and out of A. Fib that is causing lightheadedness and chest discomfort when in A.Fib. Pt reports when out of A. Fib heart rate is in the 50's and when in A. Fib heart rate is around 112 to 120. Pt reports that he has been taking his medication as prescribed. Pt has been noticing that he has been having gastric distension for the last several days making it difficult to eat and notices more when in A. Fib. Spoke to [facility]) and advised to have patient go to A.Fib clinic. Appointment has been made for 11/29/23 at A. Fib clinic and also have appointment to see one of the PA's in [place] on 11/24/23.

## 2023-11-24 ENCOUNTER — Telehealth: Payer: Self-pay

## 2023-11-24 ENCOUNTER — Ambulatory Visit: Attending: Cardiology | Admitting: Cardiology

## 2023-11-24 ENCOUNTER — Ambulatory Visit

## 2023-11-24 VITALS — BP 124/62 | HR 70 | Ht 73.0 in | Wt 218.0 lb

## 2023-11-24 DIAGNOSIS — Z79899 Other long term (current) drug therapy: Secondary | ICD-10-CM

## 2023-11-24 DIAGNOSIS — Z5181 Encounter for therapeutic drug level monitoring: Secondary | ICD-10-CM | POA: Diagnosis not present

## 2023-11-24 DIAGNOSIS — D6869 Other thrombophilia: Secondary | ICD-10-CM

## 2023-11-24 DIAGNOSIS — I428 Other cardiomyopathies: Secondary | ICD-10-CM | POA: Diagnosis not present

## 2023-11-24 DIAGNOSIS — I480 Paroxysmal atrial fibrillation: Secondary | ICD-10-CM

## 2023-11-24 NOTE — Telephone Encounter (Signed)
 Called patient, LVM- advised that the upcoming AFIB clinic appointment was not needed. Cancelled this appointment, advised patient to call back if questions/concerns.

## 2023-11-24 NOTE — Patient Instructions (Signed)
 Medication Instructions:  Try for 1 month an over the counter acid reducer.  *If you need a refill on your cardiac medications before your next appointment, please call your pharmacy*  Lab Work: Your provider would like for you to have following labs drawn today BMET, MAG.   If you have labs (blood work) drawn today and your tests are completely normal, you will receive your results only by: MyChart Message (if you have MyChart) OR A paper copy in the mail If you have any lab test that is abnormal or we need to change your treatment, we will call you to review the results.  Testing/Procedures:  Delane Fear- Long Term Monitor Instructions  Your physician has requested you wear a ZIO patch monitor for 7 days.  This is a single patch monitor. [employer] supplies one patch monitor per enrollment. Additional stickers are not available. Please do not apply patch if you will be having a Nuclear Stress Test,  Echocardiogram, Cardiac CT, MRI, or Chest Xray during the period you would be wearing the  monitor. The patch cannot be worn during these tests. You cannot remove and re-apply the  ZIO XT patch monitor.  Your ZIO patch monitor will be mailed 3 day USPS to your address on file. It may take 3-5 days  to receive your monitor after you have been enrolled.  Once you have received your monitor, please review the enclosed instructions. Your monitor  has already been registered assigning a specific monitor serial # to you.  Billing and Patient Assistance Program Information  We have supplied [employer] with any of your insurance information on file for billing purposes. [employer] offers a sliding scale Patient Assistance Program for patients that do not have  insurance, or whose insurance does not completely cover the cost of the ZIO monitor.  You must apply for the Patient Assistance Program to qualify for this discounted rate.  To apply, please call [employer] at [phone], select option 4, select option  2, ask to apply for  Patient Assistance Program. Sanna Crystal will ask your household income, and how many people  are in your household. They will quote your out-of-pocket cost based on that information.  [employer] will also be able to set up a 73-month, interest-free payment plan if needed.  Applying the monitor   Shave hair from upper left chest.  Hold abrader disc by orange tab. Rub abrader in 40 strokes over the upper left chest as  indicated in your monitor instructions.  Clean area with 4 enclosed alcohol pads. Let dry.  Apply patch as indicated in monitor instructions. Patch will be placed under collarbone on left  side of chest with arrow pointing upward.  Rub patch adhesive wings for 2 minutes. Remove white label marked "1". Remove the white  label marked "2". Rub patch adhesive wings for 2 additional minutes.  While looking in a mirror, press and release button in center of patch. A small green light will  flash 3-4 times. This will be your only indicator that the monitor has been turned on.  Do not shower for the first 24 hours. You may shower after the first 24 hours.  Press the button if you feel a symptom. You will hear a small click. Record Date, Time and  Symptom in the Patient Logbook.  When you are ready to remove the patch, follow instructions on the last 2 pages of Patient  Logbook. Stick patch monitor onto the last page of Patient Logbook.  Place Patient Logbook  in the blue and white box. Use locking tab on box and tape box closed  securely. The blue and white box has prepaid postage on it. Please place it in the mailbox as  soon as possible. Your physician should have your test results approximately 7 days after the  monitor has been mailed back to [facility].  Call [facility] Customer Care at [phone] if you have questions regarding  your ZIO XT patch monitor. Call them immediately if you see an orange light blinking on your  monitor.  If your monitor falls  off in less than 4 days, contact our Monitor department at [phone].  If your monitor becomes loose or falls off after 4 days call [employer] at [phone] for  suggestions on securing your monitor   Follow-Up: At [facility], you and your health needs are our priority.  As part of our continuing mission to provide you with exceptional heart care, our providers are all part of one team.  This team includes your primary Cardiologist (physician) and Advanced Practice Providers or APPs (Physician Assistants and Nurse Practitioners) who all work together to provide you with the care you need, when you need it.  Your next appointment:   1 month(s)  Provider:   Harvie Liner, MD (switch APP upcoming appointment to [facility] in [place])    We recommend signing up for the patient portal called "MyChart".  Sign up information is provided on this After Visit Summary.  MyChart is used to connect with patients for Virtual Visits (Telemedicine).  Patients are able to view lab/test results, encounter notes, upcoming appointments, etc.  Non-urgent messages can be sent to your provider as well.   To learn more about what you can do with MyChart, go to [email or web address].

## 2023-11-25 LAB — MAGNESIUM: Magnesium: 2.2 mg/dL (ref 1.6–2.3)

## 2023-11-25 LAB — BASIC METABOLIC PANEL WITH GFR
BUN/Creatinine Ratio: 18 (ref 10–24)
BUN: 18 mg/dL (ref 8–27)
CO2: 25 mmol/L (ref 20–29)
Calcium: 10 mg/dL (ref 8.6–10.2)
Chloride: 98 mmol/L (ref 96–106)
Creatinine, Ser: 1.01 mg/dL (ref 0.76–1.27)
Glucose: 129 mg/dL — ABNORMAL HIGH (ref 70–99)
Potassium: 4.6 mmol/L (ref 3.5–5.2)
Sodium: 138 mmol/L (ref 134–144)
eGFR: 78 mL/min/{1.73_m2} (ref >59)

## 2023-11-29 ENCOUNTER — Ambulatory Visit (HOSPITAL_COMMUNITY): Admitting: Internal Medicine

## 2023-12-14 ENCOUNTER — Other Ambulatory Visit: Payer: Self-pay | Admitting: Family Medicine

## 2023-12-16 DIAGNOSIS — I480 Paroxysmal atrial fibrillation: Secondary | ICD-10-CM | POA: Diagnosis not present

## 2023-12-18 ENCOUNTER — Other Ambulatory Visit: Payer: Self-pay | Admitting: Family Medicine

## 2024-01-06 NOTE — Progress Notes (Unsigned)
 Electrophysiology Office Follow up Visit Note:    Date:  01/07/2024   ID:  Christopher Navarro, DOB 11-15-1950, MRN [id number]  PCP:  Marquetta Sit, MD  [facility] Cardiologist:  [name], MD  [facility] Electrophysiologist:  Boyce Byes, MD    Interval History:     Christopher Navarro is a 73 y.o. male who presents for a follow up visit.   The patient was last seen by [facility] November 24, 2023.  He has a history of persistent atrial fibrillation, coronary artery disease, chronic systolic heart failure, hypertension, diabetes.  He had a prior catheter ablation for atrial fibrillation with Dr. Nunzio Belch in 2021.  He also had a WPW ablation in 1997.  He saw RNA in March of this year and reported an increased burden of atrial fibrillation despite taking Tikosyn .  At the appointment with Ottie Blonder on April 16 he reported continued episodes of symptomatic atrial fibrillation.  A heart monitor was ordered to quantify the amount of atrial fibrillation.  Today he is doing okay.  He says that his A-fib comes and goes.  He is able to capture it on his [facility].  He tells me that sometimes when he lays down at night he will feel a bloated sensation in his epigastrium.  He has recently started taking an over-the-counter antacid.  He has previously had an EGD and colonoscopy which was unrevealing.  No chest pain.  He describes a pressure-like sensation when he goes into atrial fibrillation.  No syncope or presyncope.  He is doing well with his medications.  No missed doses of anticoagulant.      Past medical, surgical, social and family history were reviewed.  ROS:   Please see the history of present illness.    All other systems reviewed and are negative.  EKGs/Labs/Other Studies Reviewed:    The following studies were reviewed today:  Dec 16, 2023 ZIO monitor 2% atrial flutter burden, rate 109 bpm, longest episode 98 minutes Occasional supraventricular ectopy 202 NSVT episodes, longest  13 beats   November 24, 2023 EKG shows sinus rhythm.  No preexcitation.  QTc 460 ms.  November 24, 2023 creatinine 1.01        Physical Exam:    VS:  BP 110/70 (BP Location: Left Arm, Patient Position: Sitting, Cuff Size: Large)   Pulse (!) 48   Ht 6\' 1"  (1.854 m)   Wt 216 lb (98 kg)   SpO2 98%   BMI 28.50 kg/m     Wt Readings from Last 3 Encounters:  01/07/24 216 lb (98 kg)  11/24/23 218 lb (98.9 kg)  10/18/23 226 lb (102.5 kg)     GEN: no distress CARD: RRR, No MRG RESP: No IWOB. CTAB.      ASSESSMENT:    1. Persistent atrial fibrillation (HCC)   2. Chronic systolic CHF (congestive heart failure) (HCC)   3. Encounter for long-term (current) use of high-risk medication    PLAN:    In order of problems listed above:  #Persistent atrial fibrillation #High risk med monitoring-Tikosyn  The patient has residual symptomatic atrial fibrillation flutter despite treatment with Tikosyn  and a prior catheter ablation. He is currently on Tikosyn .  His QTc is acceptable for ongoing Tikosyn  use. He does have some ventricular ectopy but nothing sustained in some of the labeled NSVT episodes appear to be supraventricular in origin.  I discussed treatment strategies with the patient during today's office visit including continuing with Tikosyn  versus pursuing catheter  ablation for improved control of his arrhythmia.  He is interested in proceeding with catheter ablation.  Discussed treatment options today for AF including antiarrhythmic drug therapy and ablation. Discussed risks, recovery and likelihood of success with each treatment strategy. Risk, benefits, and alternatives to EP study and ablation for afib were discussed. These risks include but are not limited to stroke, bleeding, vascular damage, tamponade, perforation, damage to the esophagus, lungs, phrenic nerve and other structures, pulmonary vein stenosis, worsening renal function, coronary vasospasm and death.  Discussed  potential need for repeat ablation procedures and antiarrhythmic drugs after an initial ablation. The patient understands these risk and wishes to proceed.  We will therefore proceed with catheter ablation at the next available time.  Carto, ICE, anesthesia are requested for the procedure.  Will also obtain CT PV protocol prior to the procedure to exclude LAA thrombus and further evaluate atrial anatomy.  Continue Eliquis   #Bloated Trial of Protonix 40 mg by mouth once daily.  Follow-up with primary care physician.  Stop taking over-the-counter antacid.  #Chronic systolic heart failure Rhythm control indicated Continue Jardiance , Entresto , Coreg    Signed, Harvie Liner, MD, [facility], [facility] 01/07/2024 8:31 AM    Electrophysiology [facility]

## 2024-01-07 ENCOUNTER — Encounter: Payer: Self-pay | Admitting: Cardiology

## 2024-01-07 ENCOUNTER — Ambulatory Visit: Attending: Cardiology | Admitting: Cardiology

## 2024-01-07 ENCOUNTER — Other Ambulatory Visit: Payer: Self-pay

## 2024-01-07 VITALS — BP 110/70 | HR 48 | Ht 73.0 in | Wt 216.0 lb

## 2024-01-07 DIAGNOSIS — Z79899 Other long term (current) drug therapy: Secondary | ICD-10-CM | POA: Diagnosis not present

## 2024-01-07 DIAGNOSIS — I4819 Other persistent atrial fibrillation: Secondary | ICD-10-CM | POA: Diagnosis not present

## 2024-01-07 DIAGNOSIS — I5022 Chronic systolic (congestive) heart failure: Secondary | ICD-10-CM

## 2024-01-07 MED ORDER — PANTOPRAZOLE SODIUM 40 MG PO TBEC: 40.0000 mg | DELAYED_RELEASE_TABLET | Freq: Every day | ORAL | 3 refills | Status: AC

## 2024-01-07 NOTE — Patient Instructions (Addendum)
 Medication Instructions:  Your physician has recommended you make the following change in your medication:  1) STOP taking OTC antacid 2) START taking Protonix 40 mg daily  *If you need a refill on your cardiac medications before your next appointment, please call your pharmacy*  Lab Work: BMET and CBC - you may go to any [facility] location to have these drawn within 30 days of your procedure   Testing/Procedures: Cardiac CT Your physician has requested that you have cardiac CT. Cardiac computed tomography (CT) is a painless test that uses an x-ray machine to take clear, detailed pictures of your heart. For further information please visit [email or web address]  We will call you to schedule your CT scan. It will be done about three weeks prior to your ablation.  Ablation Your physician has recommended that you have an ablation. Catheter ablation is a medical procedure used to treat some cardiac arrhythmias (irregular heartbeats). During catheter ablation, a long, thin, flexible tube is put into a blood vessel in your groin (upper thigh), or neck. This tube is called an ablation catheter. It is then guided to your heart through the blood vessel. Radio frequency waves destroy small areas of heart tissue where abnormal heartbeats may cause an arrhythmia to start. You are scheduled for Atrial Fibrillation Ablation on Tuesday, August 19 with Dr. Harvie Liner.Please arrive at the Main Entrance A at [facility]: [address] [place], [place] [address] at 5:30 AM    Follow-Up: At [facility], you and your health needs are our priority.  As part of our continuing mission to provide you with exceptional heart care, we have created designated Provider Care Teams.  These Care Teams include your primary Cardiologist (physician) and Advanced Practice Providers (APPs -  Physician Assistants and Nurse Practitioners) who all work together to provide you with the care you need, when you need  it.   Your next appointment:   We will contact you about your post-procedure follow up appointments.

## 2024-01-17 ENCOUNTER — Ambulatory Visit: Admitting: Physician Assistant

## 2024-01-18 ENCOUNTER — Ambulatory Visit: Admitting: Physician Assistant

## 2024-01-31 ENCOUNTER — Other Ambulatory Visit (INDEPENDENT_AMBULATORY_CARE_PROVIDER_SITE_OTHER)

## 2024-01-31 ENCOUNTER — Encounter: Payer: Self-pay | Admitting: Orthopedic Surgery

## 2024-01-31 ENCOUNTER — Ambulatory Visit (INDEPENDENT_AMBULATORY_CARE_PROVIDER_SITE_OTHER): Admitting: Orthopedic Surgery

## 2024-01-31 DIAGNOSIS — M545 Low back pain, unspecified: Secondary | ICD-10-CM

## 2024-01-31 NOTE — Progress Notes (Signed)
 Office Visit Note   Patient: Christopher Navarro           Date of Birth: 05/29/1951           MRN: [id number] Visit Date: 01/31/2024 Requested by: [name] ORN, MD [address] [place],  [place] [address] PCP: [name] ORN, MD  Subjective: Chief Complaint  Patient presents with   Lower Back - Pain    HPI: Christopher Navarro is a 73 y.o. male who presents to the office reporting low back pain.  He states the pain is relatively minor but chronic.  Became severe 1 week ago starting 01/22/2024 when he was taking close out of a dryer.  Reports spasms as well as difficulty standing up straight.  Does wake him from sleep.  He has noticed some improvement over the last day but overall the pain remains significant.  Had prior surgery in 2007 with Dr. Baird at L4-5 and then 2012 with Dr. Joshua.  This last surgery was complicated with bad infection treated with IV PICC line and 2 months of rest.  He did have fevers and chills associated with that episode of infection.  Is not really having much in terms of fevers and chills now.  However the pain level is about the same particularly with the recent increase in pain.  Currently he is being treated for atrial fibrillation with Eliquis .  Diagnosed with atrial fibrillation in 2009.  Likes to go to the gym 5 days a week but has not been able to do that for the past week because of this pain.  Has ablation surgery scheduled for August 19.  Does report some radicular leg pain more on the right than the left but that has improved.  He has tried to do some walking and stretching but without relief.  Does hurt him to go up and down stairs.  Likes to exercise a [place] has not been able to do so because of his back pain.  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                ROS: All systems reviewed are negative as they relate to the chief complaint within the history of present illness.  Patient denies fevers or  chills.  Assessment & Plan: Visit Diagnoses:  1. Low back pain, unspecified back pain laterality, unspecified chronicity, unspecified whether sciatica present     Plan: Impression is low back pain with fairly significant degenerative disc disease at L4-5.  With the sudden increase in pain and his history of infection I think MRI scan is indicated because of his history of discitis.  Cannot rule that out at this time based on plain radiographic findings.  Follow-up after that study.  Follow-Up Instructions: No follow-ups on file.   Orders:  Orders Placed This Encounter  Procedures   XR Lumbar Spine 2-3 Views   MR Lumbar Spine w/o contrast   No orders of the defined types were placed in this encounter.     Procedures: No procedures performed   Clinical Data: No additional findings.  Objective: Vital Signs: There were no vitals taken for this visit.  Physical Exam:  Constitutional: Patient appears well-developed HEENT:  Head: Normocephalic Eyes:EOM are normal Neck: Normal range of motion Cardiovascular: Normal rate Pulmonary/chest: Effort normal Neurologic: Patient is alert Skin: Skin is warm Psychiatric: Patient has normal mood and affect  Ortho Exam: Ortho exam demonstrates some pain with extension  less so with flexion.  5 out of 5 ankle dorsiflexion plantarflexion quad and hamstring strength.  Mildly positive nerve root tension signs on the right negative on the left.  No groin pain with internal or external rotation of either hip.  Well-healed surgical incision on the back without fluctuance erythema or induration.  Specialty Comments:  No specialty comments available.  Imaging: No results found.   PMFS History: Patient Active Problem List   Diagnosis Date Noted   Secondary hypercoagulable state (HCC) 04/23/2020   Afib (HCC) 10/18/2018   Chronic systolic CHF (congestive heart failure) (HCC) 09/15/2018   Persistent atrial fibrillation (HCC) 07/20/2018    Obesity (BMI 30-39.9) 08/28/2013   Diskitis, L5-S1 02/12/2011   BENIGN NEOPLASM OF SKIN    Dyslipidemia    Diabetes mellitus, type II (HCC)    NEOPLASM OF UNCERTAIN BEHAVIOR OF SKIN 08/13/2010   Type 2 diabetes mellitus, controlled (HCC) 08/13/2010   DYSLIPIDEMIA 08/13/2010   Essential hypertension 06/20/2009   FASCIITIS, PLANTAR 06/20/2009   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   Diabetes mellitus type II    Dyslipidemia    FASCIITIS, PLANTAR 06/20/2009   HYPERTENSION 06/20/2009    Family History  Problem Relation Age of Onset   Arthritis Other    Hyperlipidemia Other    Hypertension Other    Stroke Mother     Past Surgical History:  Procedure Laterality Date   ablasion  1997   ATRIAL FIBRILLATION ABLATION N/A 03/26/2020   Procedure: ATRIAL FIBRILLATION ABLATION;  Surgeon: [name] Agent, MD;  Location: [facility] INVASIVE CV LAB;  Service: Cardiovascular;  Laterality: N/A;   BACK SURGERY  2006   CARDIOVERSION N/A 08/15/2018   Procedure: CARDIOVERSION;  Surgeon: Okey Vina GAILS, MD;  Location: [facility] ENDOSCOPY;  Service: Cardiovascular;  Laterality: N/A;   CARDIOVERSION N/A 10/20/2018   Procedure: CARDIOVERSION;  Surgeon: Lonni Slain, MD;  Location: [facility] ENDOSCOPY;  Service: Cardiovascular;  Laterality: N/A;   CARPAL TUNNEL RELEASE  2004   Bilateral   KNEE ARTHROSCOPY  2002   RIGHT/LEFT HEART CATH AND CORONARY ANGIOGRAPHY N/A 09/15/2018   Procedure: RIGHT/LEFT HEART CATH AND CORONARY ANGIOGRAPHY;  Surgeon: Swaziland, Peter M, MD;  Location: [facility] INVASIVE CV LAB;  Service: Cardiovascular;  Laterality: N/A;   Social History   Occupational History   Not on file  Tobacco Use   Smoking status: Former   Smokeless tobacco: Never  Vaping Use   Vaping status: Never Used  Substance and Sexual Activity   Alcohol use: No   Drug use: No   Sexual activity: Not on file

## 2024-02-02 ENCOUNTER — Ambulatory Visit
Admission: RE | Admit: 2024-02-02 | Discharge: 2024-02-02 | Disposition: A | Discharge status: Home / Self Care | Source: Ambulatory Visit | Attending: Orthopedic Surgery | Admitting: Orthopedic Surgery

## 2024-02-02 DIAGNOSIS — M545 Low back pain, unspecified: Secondary | ICD-10-CM

## 2024-02-14 ENCOUNTER — Ambulatory Visit: Payer: Self-pay | Admitting: Orthopedic Surgery

## 2024-02-14 NOTE — Progress Notes (Signed)
 Hi [name].  I called [name].  His scan looks okay.  Nothing infected and nothing really actionable in terms of needing more surgery.  He wants to try to stretch it out on his own.  Told him to call us  back if he gets worse.  Please cancel his appointment for the 16th.  Thanks

## 2024-02-23 ENCOUNTER — Ambulatory Visit: Admitting: Orthopedic Surgery

## 2024-02-25 ENCOUNTER — Telehealth: Payer: Self-pay

## 2024-02-25 ENCOUNTER — Other Ambulatory Visit: Payer: Self-pay | Admitting: Cardiology

## 2024-02-25 LAB — CBC

## 2024-02-25 NOTE — Telephone Encounter (Signed)
 Called and spoke with patient regarding his upcoming CT scan and needing to have blood work done prior. Patient stated that he had already done the blood work today 02/25/2024, but it was just outside the 30 day window for the ablation. Patient was upset when I mentioned to him that he will have to repeat blood work for ablation. He agreed to come back to have that done 03/21/24.

## 2024-02-28 LAB — BASIC METABOLIC PANEL WITH GFR
BUN/Creatinine Ratio: 13 (ref 10–24)
BUN: 11 mg/dL (ref 8–27)
CO2: 25 mmol/L (ref 20–29)
Calcium: 9.3 mg/dL (ref 8.6–10.2)
Chloride: 99 mmol/L (ref 96–106)
Creatinine, Ser: 0.86 mg/dL (ref 0.76–1.27)
Glucose: 116 mg/dL — ABNORMAL HIGH (ref 70–99)
Potassium: 4.2 mmol/L (ref 3.5–5.2)
Sodium: 140 mmol/L (ref 134–144)
eGFR: 90 mL/min/1.73 (ref >59)

## 2024-02-28 LAB — CBC
Hematocrit: 50.3 (ref 37.5–51.0)
Hemoglobin: 16 g/dL (ref 13.0–17.7)
MCH: 27.1 pg (ref 26.6–33.0)
MCHC: 31.8 g/dL (ref 31.5–35.7)
MCV: 85 fL (ref 79–97)
Platelets: 290 x10E3/uL (ref 150–450)
RBC: 5.9 x10E6/uL — AB (ref 4.14–5.80)
RDW: 14.5 (ref 11.6–15.4)
WBC: 6.7 x10E3/uL (ref 3.4–10.8)

## 2024-02-29 ENCOUNTER — Other Ambulatory Visit: Payer: Self-pay

## 2024-02-29 ENCOUNTER — Telehealth: Payer: Self-pay

## 2024-02-29 DIAGNOSIS — I4819 Other persistent atrial fibrillation: Secondary | ICD-10-CM

## 2024-02-29 NOTE — Telephone Encounter (Signed)
 CT/Ablation letters mailed to pt.

## 2024-03-07 ENCOUNTER — Ambulatory Visit: Payer: Self-pay | Admitting: Cardiology

## 2024-03-07 ENCOUNTER — Ambulatory Visit (HOSPITAL_COMMUNITY)
Admission: RE | Admit: 2024-03-07 | Discharge: 2024-03-07 | Disposition: A | Discharge status: Home / Self Care | Source: Ambulatory Visit | Attending: Cardiology | Admitting: Cardiology

## 2024-03-07 DIAGNOSIS — I4819 Other persistent atrial fibrillation: Secondary | ICD-10-CM | POA: Diagnosis present

## 2024-03-07 DIAGNOSIS — I700 Atherosclerosis of aorta: Secondary | ICD-10-CM | POA: Diagnosis not present

## 2024-03-07 DIAGNOSIS — Z79899 Other long term (current) drug therapy: Secondary | ICD-10-CM | POA: Insufficient documentation

## 2024-03-07 DIAGNOSIS — I5022 Chronic systolic (congestive) heart failure: Secondary | ICD-10-CM | POA: Insufficient documentation

## 2024-03-07 MED ORDER — IOHEXOL 350 MG/ML SOLN
80.0000 mL | Freq: Once | INTRAVENOUS | Status: AC | PRN
  Administered 2024-03-07: 80 mL via INTRAVENOUS

## 2024-03-08 NOTE — Telephone Encounter (Signed)
 Spoke with the patient and made him aware of CT results and recommendations from Dr. Cindie. Patient is in agreement with plan. TEE has been scheduled for 8/5

## 2024-03-13 NOTE — Progress Notes (Signed)
 Spoke to patient and instructed them to come at [facility]  and to be NPO after 0000.     Confirmed that patient will have a ride home and someone to stay with them for 24 hours after the procedure.   Medications reviewed.  Confirmed blood thinner.  Confirmed no breaks in taking blood thinner for 3+ weeks prior to procedure. Confirmed patient stopped all GLP-1s and GLP-2s for at least one week before procedure.

## 2024-03-13 NOTE — Progress Notes (Signed)
 Left message with wife for patient to return call for instructions for appointment 03/14/24.

## 2024-03-13 NOTE — Anesthesia Preprocedure Evaluation (Signed)
 Anesthesia Evaluation  Patient identified by MRN, date of birth, ID band Patient awake    Reviewed: Allergy & Precautions, NPO status , Patient's Chart, lab work & pertinent test results, reviewed documented beta blocker date and time   Airway Mallampati: II  TM Distance: >3 FB Neck ROM: Full    Dental  (+) Teeth Intact, Dental Advisory Given, Implants   Pulmonary former smoker   Pulmonary exam normal breath sounds clear to auscultation       Cardiovascular hypertension, Pt. on home beta blockers and Pt. on medications +CHF  Normal cardiovascular exam+ dysrhythmias Atrial Fibrillation  Rhythm:Regular Rate:Normal  TTE 2022 1. Left ventricular ejection fraction, by estimation, is 55 to 60%. The  left ventricle has normal function. The left ventricle has no regional  wall motion abnormalities.   2. E/A ratio >2.2 which would be consistent with restrictive filling,  however, tissue dopplers normal and no other parameters suggestive of  restrictive process. Suspect impaired relaxation without restriction.   3. Right ventricular systolic function is normal. The right ventricular  size is normal.   4. The mitral valve is normal in structure. Trivial mitral valve  regurgitation.   5. The aortic valve is tricuspid. Aortic valve regurgitation is not  visualized. No aortic stenosis is present.   6. The inferior vena cava is normal in size with greater than 50%  respiratory variability, suggesting right atrial pressure of 3 mmHg.     Neuro/Psych negative neurological ROS  negative psych ROS   GI/Hepatic negative GI ROS, Neg liver ROS,,,  Endo/Other  diabetes, Type 2, Oral Hypoglycemic Agents    Renal/GU negative Renal ROS  negative genitourinary   Musculoskeletal  (+) Arthritis ,    Abdominal   Peds  Hematology  (+) Blood dyscrasia (eliquis )   Anesthesia Other Findings   Reproductive/Obstetrics                               Anesthesia Physical Anesthesia Plan  ASA: 3  Anesthesia Plan: MAC   Post-op Pain Management:    Induction: Intravenous  PONV Risk Score and Plan: Propofol  infusion and Treatment may vary due to age or medical condition  Airway Management Planned: Natural Airway  Additional Equipment:   Intra-op Plan:   Post-operative Plan:   Informed Consent: I have reviewed the patients History and Physical, chart, labs and discussed the procedure including the risks, benefits and alternatives for the proposed anesthesia with the patient or authorized representative who has indicated his/her understanding and acceptance.     Dental advisory given  Plan Discussed with: CRNA  Anesthesia Plan Comments:          Anesthesia Quick Evaluation

## 2024-03-14 ENCOUNTER — Ambulatory Visit (HOSPITAL_COMMUNITY)
Admission: RE | Admit: 2024-03-14 | Discharge: 2024-03-14 | Disposition: A | Discharge status: Home / Self Care | Attending: Cardiology | Admitting: Cardiology

## 2024-03-14 ENCOUNTER — Ambulatory Visit (HOSPITAL_BASED_OUTPATIENT_CLINIC_OR_DEPARTMENT_OTHER): Admitting: Anesthesiology

## 2024-03-14 ENCOUNTER — Other Ambulatory Visit: Payer: Self-pay

## 2024-03-14 ENCOUNTER — Encounter (HOSPITAL_COMMUNITY)
Admission: RE | Disposition: A | Discharge status: Home / Self Care | Payer: Self-pay | Source: Home / Self Care | Attending: Cardiology

## 2024-03-14 ENCOUNTER — Ambulatory Visit (HOSPITAL_BASED_OUTPATIENT_CLINIC_OR_DEPARTMENT_OTHER)
Admission: RE | Admit: 2024-03-14 | Discharge: 2024-03-14 | Disposition: A | Discharge status: Home / Self Care | Source: Ambulatory Visit | Attending: Cardiology | Admitting: Cardiology

## 2024-03-14 ENCOUNTER — Ambulatory Visit (HOSPITAL_COMMUNITY): Admitting: Anesthesiology

## 2024-03-14 ENCOUNTER — Encounter (HOSPITAL_COMMUNITY): Payer: Self-pay | Admitting: Cardiology

## 2024-03-14 DIAGNOSIS — I110 Hypertensive heart disease with heart failure: Secondary | ICD-10-CM

## 2024-03-14 DIAGNOSIS — Z01818 Encounter for other preprocedural examination: Secondary | ICD-10-CM | POA: Diagnosis not present

## 2024-03-14 DIAGNOSIS — Z86718 Personal history of other venous thrombosis and embolism: Secondary | ICD-10-CM | POA: Insufficient documentation

## 2024-03-14 DIAGNOSIS — I509 Heart failure, unspecified: Secondary | ICD-10-CM | POA: Insufficient documentation

## 2024-03-14 DIAGNOSIS — I480 Paroxysmal atrial fibrillation: Secondary | ICD-10-CM | POA: Insufficient documentation

## 2024-03-14 DIAGNOSIS — E119 Type 2 diabetes mellitus without complications: Secondary | ICD-10-CM | POA: Insufficient documentation

## 2024-03-14 DIAGNOSIS — I4891 Unspecified atrial fibrillation: Secondary | ICD-10-CM | POA: Insufficient documentation

## 2024-03-14 DIAGNOSIS — Z01810 Encounter for preprocedural cardiovascular examination: Secondary | ICD-10-CM | POA: Insufficient documentation

## 2024-03-14 DIAGNOSIS — Z7984 Long term (current) use of oral hypoglycemic drugs: Secondary | ICD-10-CM | POA: Insufficient documentation

## 2024-03-14 DIAGNOSIS — I5022 Chronic systolic (congestive) heart failure: Secondary | ICD-10-CM | POA: Insufficient documentation

## 2024-03-14 DIAGNOSIS — Z7901 Long term (current) use of anticoagulants: Secondary | ICD-10-CM | POA: Diagnosis not present

## 2024-03-14 DIAGNOSIS — I340 Nonrheumatic mitral (valve) insufficiency: Secondary | ICD-10-CM

## 2024-03-14 DIAGNOSIS — Z87891 Personal history of nicotine dependence: Secondary | ICD-10-CM | POA: Diagnosis not present

## 2024-03-14 DIAGNOSIS — E785 Hyperlipidemia, unspecified: Secondary | ICD-10-CM | POA: Insufficient documentation

## 2024-03-14 DIAGNOSIS — Z79899 Other long term (current) drug therapy: Secondary | ICD-10-CM | POA: Insufficient documentation

## 2024-03-14 HISTORY — PX: TRANSESOPHAGEAL ECHOCARDIOGRAM (CATH LAB): EP1270

## 2024-03-14 LAB — GLUCOSE, CAPILLARY: Glucose-Capillary: 147 mg/dL — ABNORMAL HIGH (ref 70–99)

## 2024-03-14 LAB — ECHO TEE

## 2024-03-14 SURGERY — TRANSESOPHAGEAL ECHOCARDIOGRAM (TEE) (CATHLAB)
Anesthesia: Monitor Anesthesia Care

## 2024-03-14 MED ORDER — SODIUM CHLORIDE 0.9 % IV SOLN: INTRAVENOUS | Status: DC

## 2024-03-14 MED ORDER — LIDOCAINE 2% (20 MG/ML) 5 ML SYRINGE
INTRAMUSCULAR | Status: DC | PRN
  Administered 2024-03-14: 100 mg via INTRAVENOUS

## 2024-03-14 MED ORDER — PROPOFOL 10 MG/ML IV BOLUS
INTRAVENOUS | Status: DC | PRN
  Administered 2024-03-14: 50 mg via INTRAVENOUS

## 2024-03-14 MED ORDER — PROPOFOL 500 MG/50ML IV EMUL
INTRAVENOUS | Status: DC | PRN
Start: 2024-03-14 — End: 2024-03-14
  Administered 2024-03-14: 150 ug/kg/min via INTRAVENOUS

## 2024-03-14 NOTE — CV Procedure (Signed)
    TRANSESOPHAGEAL ECHOCARDIOGRAM   NAME:  Christopher Navarro    MRN: [id number] DOB:  1951/02/16    ADMIT DATE: 03/14/2024  INDICATIONS: Paroxysmal atrial fibrillation  PROCEDURE:   Informed consent was obtained prior to the procedure. The risks, benefits and alternatives for the procedure were discussed and the patient comprehended these risks.  Risks include, but are not limited to, cough, sore throat, vomiting, nausea, somnolence, esophageal and stomach trauma or perforation, bleeding, low blood pressure, aspiration, pneumonia, infection, trauma to the teeth and death.    Procedural time out performed. The oropharynx was anesthetized with viscous lidocaine .  Anesthesia was administered by the anaesthesilogy team to achieve and maintain moderate to deep conscious sedation.  The patient's heart rate, blood pressure, and oxygen saturation were monitored continuously during the procedure.  The transesophageal probe was inserted in the esophagus and stomach without difficulty and multiple views were obtained.   The patient tolerated the procedure well.  COMPLICATIONS:    There were no immediate complications.  KEY FINDINGS:  Normal left ventricular systolic function, EF 55 to 60%, no left atrial appendage clot seen, mild mitral vegetation, trivial aortic regurgitation.  Full report to follow. Further management per primary team.   Cristiana Yochim, DO [facility]   [facility]  8:54 AM

## 2024-03-14 NOTE — H&P (Signed)
 Cardiology Admission History and Physical   Patient ID: Christopher Navarro MRN: [id number]; DOB: January 29, 1951   Admission date: 03/14/2024  PCP:  [name] ORN, MD   [facility] Providers Cardiologist:  [name], MD  Electrophysiologist:  OLE ONEIDA HOLTS, MD      Chief Complaint: Here for TEE  Patient Profile: Christopher Navarro is a 73 y.o. male with paroxysmal atrial fibrillation who is being seen 03/14/2024 for TEE.  History of Present Illness: Christopher Navarro presents today for scheduled TEE prior to his cardioversion.  He has a history of WPW ablation in 1997, chronic systolic heart failure, dyslipidemia hypertension, diabetes.  Here for no complaints today   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   Diabetes mellitus type II    Dyslipidemia    FASCIITIS, PLANTAR 06/20/2009   HYPERTENSION 06/20/2009   Past Surgical History:  Procedure Laterality Date   ablasion  1997   ATRIAL FIBRILLATION ABLATION N/A 03/26/2020   Procedure: ATRIAL FIBRILLATION ABLATION;  Surgeon: [name] Agent, MD;  Location: [facility] INVASIVE CV LAB;  Service: Cardiovascular;  Laterality: N/A;   BACK SURGERY  2006   CARDIOVERSION N/A 08/15/2018   Procedure: CARDIOVERSION;  Surgeon: Okey Vina GAILS, MD;  Location: [facility] ENDOSCOPY;  Service: Cardiovascular;  Laterality: N/A;   CARDIOVERSION N/A 10/20/2018   Procedure: CARDIOVERSION;  Surgeon: Lonni Slain, MD;  Location: [facility] ENDOSCOPY;  Service: Cardiovascular;  Laterality: N/A;   CARPAL TUNNEL RELEASE  2004   Bilateral   KNEE ARTHROSCOPY  2002   RIGHT/LEFT HEART CATH AND CORONARY ANGIOGRAPHY N/A 09/15/2018   Procedure: RIGHT/LEFT HEART CATH AND CORONARY ANGIOGRAPHY;  Surgeon: Swaziland, Peter M, MD;  Location: [facility] INVASIVE CV LAB;  Service: Cardiovascular;  Laterality: N/A;     Medications Prior to Admission: Prior to Admission medications   Medication Sig Start Date End Date Taking?  Authorizing Provider  acetaminophen  (TYLENOL ) 500 MG tablet Take 1,000 mg by mouth every 6 (six) hours as needed for mild pain or moderate pain.   Yes [provider]  carvedilol  (COREG ) 12.5 MG tablet TAKE 1 TABLET BY MOUTH TWICE A DAY 04/13/23  Yes Swaziland, Peter M, MD  dofetilide  (TIKOSYN ) 500 MCG capsule TAKE 1 CAPSULE BY MOUTH TWICE A DAY 02/25/24  Yes HOLTS OLE ONEIDA, MD  ELIQUIS  5 MG TABS tablet TAKE 1 TABLET BY MOUTH TWICE A DAY 11/01/23  Yes Swazil[place], Peter M, MD  ENTRESTO  24-26 MG TAKE 1 TABLET BY MOUTH TWICE A DAY 10/11/23  Yes Swazil[place], Peter M, MD  JARDIANCE  10 MG TABS tablet TAKE 1 TABLET BY MOUTH EVERY DAY 09/10/23  Yes Swaziland, Peter M, MD  Magnesium  250 MG TABS Take 250 mg by mouth 3 (three) times a week.   Yes [provider]  metFORMIN  (GLUCOPHAGE -XR) 750 MG 24 hr tablet TAKE 1 TABLET BY MOUTH EVERY DAY WITH BREAKFAST 11/19/23  Yes Burchette, Wolm ORN, MD  Multiple Vitamin (MULTIVITAMIN WITH MINERALS) TABS tablet Take 1 tablet by mouth daily. Senior   Yes [provider]  pantoprazole  (PROTONIX ) 40 MG tablet Take 1 tablet (40 mg total) by mouth daily. 01/07/24  Yes HOLTS OLE ONEIDA, MD  rosuvastatin  (CRESTOR ) 20 MG tablet TAKE 1 TABLET BY MOUTH EVERY DAY 10/27/23  Yes Leverne Charlies Helling, PA-C     Allergies:    Allergies  Allergen Reactions   Ciprofloxacin Hives, Itching and Rash    Social History:  Social History   Socioeconomic History   Marital status: Married    Spouse name: Not on file   Number of children: Not on file   Years of education: Not on file   Highest education level: Associate degree: occupational, Scientist, product/process development, or vocational program  Occupational History   Not on file  Tobacco Use   Smoking status: Former   Smokeless tobacco: Never  Vaping Use   Vaping status: Never Used  Substance and Sexual Activity   Alcohol use: No   Drug use: No   Sexual activity: Not on file  Other Topics Concern   Not on file  Social History Narrative    retired   Chief Executive Officer Drivers of Corporate investment banker Strain: Low Risk  (02/16/2022)   Overall Financial Resource Strain (CARDIA)    Difficulty of Paying Living Expenses: Not hard at all  Food Insecurity: No Food Insecurity (02/16/2022)   Hunger Vital Sign    Worried About Running Out of Food in the Last Year: Never true    Ran Out of Food in the Last Year: Never true  Transportation Needs: No Transportation Needs (02/16/2022)   PRAPARE - Administrator, Civil Service (Medical): No    Lack of Transportation (Non-Medical): No  Physical Activity: Sufficiently Active (02/16/2022)   Exercise Vital Sign    Days of Exercise per Week: 5 days    Minutes of Exercise per Session: 60 min  Stress: Stress Concern Present (02/16/2022)   [employer] of Occupational Health - Occupational Stress Questionnaire    Feeling of Stress : To some extent  Social Connections: Socially Integrated (02/16/2022)   Social Connection and Isolation Panel    Frequency of Communication with Friends and Family: More than three times a week    Frequency of Social Gatherings with Friends and Family: Three times a week    Attends Religious Services: More than 4 times per year    Active Member of Clubs or Organizations: Yes    Attends Banker Meetings: Not on file    Marital Status: Married  Catering manager Violence: Not on file     Family History:   The patient's family history includes Arthritis in an other family member; Hyperlipidemia in an other family member; Hypertension in an other family member; Stroke in his mother.    ROS:  Please see the history of present illness.  All other ROS reviewed and negative.     Physical Exam/Data: Vitals:   03/14/24 0710 03/14/24 0728  BP: (!) 151/73   Pulse: (!) 54   Resp: 17 14  Temp: 98.9 F (37.2 C)   TempSrc: Temporal   SpO2: 97%   Weight: 98.9 kg   Height: 6' 1 (1.854 m)    No intake or output data in the 24 hours ending 03/14/24  0814    03/14/2024    7:10 AM 01/07/2024    8:14 AM 11/24/2023    9:42 AM  Last 3 Weights  Weight (lbs) 218 lb 216 lb 218 lb  Weight (kg) 98.884 kg 97.977 kg 98.884 kg     Body mass index is 28.76 kg/m.  General:  Well nourished, well developed, in no acute distress HEENT: normal Neck: no JVD Vascular: No carotid bruits; Distal pulses 2+ bilaterally   Cardiac:  normal S1, S2; RRR; no murmur  Lungs:  clear to auscultation bilaterally, no wheezing, rhonchi or rales  Abd: soft, nontender, no hepatomegaly  Ext: no edema Musculoskeletal:  No deformities, BUE and BLE strength normal and equal Skin: warm and dry  Neuro:  CNs 2-12 intact, no focal abnormalities noted Psych:  Normal affect   EKG: None today   Relevant CV Studies: CCTA,, echo  Laboratory Data: High Sensitivity Troponin:  No results for input(s): TROPONINIHS in the last 720 hours.    ChemistryNo results for input(s): NA, K, CL, CO2, GLUCOSE, BUN, CREATININE, CALCIUM , MG, GFRNONAA, GFRAA, ANIONGAP in the last 168 hours.  No results for input(s): PROT, ALBUMIN, AST, ALT, ALKPHOS, BILITOT in the last 168 hours. Lipids No results for input(s): CHOL, TRIG, HDL, LABVLDL, LDLCALC, CHOLHDL in the last 168 hours. HematologyNo results for input(s): WBC, RBC, HGB, HCT, MCV, MCH, MCHC, RDW, PLT in the last 168 hours. Thyroid  No results for input(s): TSH, FREET4 in the last 168 hours. BNPNo results for input(s): BNP, PROBNP in the last 168 hours.  DDimer No results for input(s): DDIMER in the last 168 hours.  Radiology/Studies:  No results found.   Assessment and Plan: Paroxysmal atrial fibrillation-here for TEE, throughout left atrial appendage clot.   Informed Consent   Shared Decision Making/Informed Consent   The risks [esophageal damage, perforation (1:10,000 risk), bleeding, pharyngeal hematoma as well as other potential complications associated  with conscious sedation including aspiration, arrhythmia, respiratory failure and death], benefits (treatment guidance and diagnostic support) and alternatives of a transesophageal echocardiogram were discussed in detail with Christopher Navarro and he is willing to proceed.       Risk Assessment/Risk Scores:        Code Status: Full Code  Severity of Illness: Same day procedure    For questions or updates, please contact [facility] Please consult [email or web address] for contact info under     Signed, Meher Kucinski, DO  03/14/2024 8:14 AM &

## 2024-03-14 NOTE — Transfer of Care (Signed)
 Immediate Anesthesia Transfer of Care Note  Patient: Christopher Navarro  Procedure(s) Performed: TRANSESOPHAGEAL ECHOCARDIOGRAM  Patient Location: PACU  Anesthesia Type:MAC  Level of Consciousness: drowsy  Airway & Oxygen Therapy: Patient Spontanous Breathing and Patient connected to nasal cannula oxygen  Post-op Assessment: Report given to RN and Post -op Vital signs reviewed and stable  Post vital signs: Reviewed and stable  Last Vitals:  Vitals Value Taken Time  BP    Temp    Pulse 51 03/14/24 08:54  Resp 9 03/14/24 08:54  SpO2 97 % 03/14/24 08:54  Vitals shown include unfiled device data.  Last Pain:  Vitals:   03/14/24 0728  TempSrc:   PainSc: 0-No pain         Complications: There were no known notable events for this encounter.

## 2024-03-14 NOTE — Anesthesia Postprocedure Evaluation (Signed)
 Anesthesia Post Note  Patient: KAYMEN ADRIAN  Procedure(s) Performed: TRANSESOPHAGEAL ECHOCARDIOGRAM     Patient location during evaluation: Cath Lab Anesthesia Type: MAC Level of consciousness: awake and alert Pain management: pain level controlled Vital Signs Assessment: post-procedure vital signs reviewed and stable Respiratory status: spontaneous breathing, nonlabored ventilation, respiratory function stable and patient connected to nasal cannula oxygen Cardiovascular status: stable and blood pressure returned to baseline Postop Assessment: no apparent nausea or vomiting Anesthetic complications: no   There were no known notable events for this encounter.  Last Vitals:  Vitals:   03/14/24 0920 03/14/24 0930  BP: (!) 140/77 136/72  Pulse: (!) 52 (!) 47  Resp: 15 (!) 21  Temp:    SpO2: 97% 98%    Last Pain:  Vitals:   03/14/24 0930  TempSrc:   PainSc: 0-No pain                 Shawny Borkowski L Kambria Grima

## 2024-03-21 ENCOUNTER — Telehealth (HOSPITAL_COMMUNITY): Payer: Self-pay

## 2024-03-21 NOTE — Telephone Encounter (Signed)
 Spoke with patient to discuss upcoming procedure.   CT and TEE: completed.  Labs: completed 8/12.   Any recent signs of acute illness or been started on antibiotics? No Any new medications started? No Any medications to hold?  Hold Jardiance  for 3 days prior to the procedure. Last dose on August 15.    Any missed doses of blood thinner? No  Advised patient to continue taking ANTICOAGULANT: Eliquis  (Apixaban ) twice daily without missing any doses.  Medication instructions:  On the morning of your procedure DO NOT take any medication., including Eliquis  or the procedure may be rescheduled. Nothing to eat or drink after midnight prior to your procedure.  Confirmed patient is scheduled for Atrial Fibrillation Ablation on Tuesday, August 19 with Dr. Ole Holts. Instructed patient to arrive at the Main Entrance A at [facility]: [address] [place], [place] [address] and check in at Admitting at 5:30 AM.  Advised of plan to go home the same day and will only stay overnight if medically necessary. You MUST have a responsible adult to drive you home and MUST be with you the first 24 hours after you arrive home or your procedure could be cancelled.  Patient verbalized understanding to all instructions provided and agreed to proceed with procedure.

## 2024-03-22 ENCOUNTER — Ambulatory Visit: Payer: Self-pay | Admitting: *Deleted

## 2024-03-22 LAB — BASIC METABOLIC PANEL WITH GFR
BUN/Creatinine Ratio: 17 (ref 10–24)
BUN: 16 mg/dL (ref 8–27)
CO2: 19 mmol/L — ABNORMAL LOW (ref 20–29)
Calcium: 9.4 mg/dL (ref 8.6–10.2)
Chloride: 100 mmol/L (ref 96–106)
Creatinine, Ser: 0.93 mg/dL (ref 0.76–1.27)
Glucose: 84 mg/dL (ref 70–99)
Potassium: 4.3 mmol/L (ref 3.5–5.2)
Sodium: 139 mmol/L (ref 134–144)
eGFR: 86 mL/min/1.73 (ref >59)

## 2024-03-22 LAB — CBC
Hematocrit: 50.9 % (ref 37.5–51.0)
Hemoglobin: 16.1 g/dL (ref 13.0–17.7)
MCH: 27.1 pg (ref 26.6–33.0)
MCHC: 31.6 g/dL (ref 31.5–35.7)
MCV: 86 fL (ref 79–97)
Platelets: 285 x10E3/uL (ref 150–450)
RBC: 5.94 x10E6/uL — ABNORMAL HIGH (ref 4.14–5.80)
RDW: 14.2 % (ref 11.6–15.4)
WBC: 6.5 x10E3/uL (ref 3.4–10.8)

## 2024-03-27 NOTE — Pre-Procedure Instructions (Signed)
 Instructed patient on the following items: Arrival time 0515 Nothing to eat or drink after midnight No meds AM of procedure Responsible person to drive you home and stay with you for 24 hrs  Have you missed any doses of anti-coagulant Eliquis- takes twice a day, hasn't missed any doses.  Don't take dose morning of procedure.

## 2024-03-28 ENCOUNTER — Ambulatory Visit (HOSPITAL_COMMUNITY)
Admission: RE | Admit: 2024-03-28 | Discharge: 2024-03-28 | Disposition: A | Discharge status: Home / Self Care | Attending: Cardiology | Admitting: Cardiology

## 2024-03-28 ENCOUNTER — Other Ambulatory Visit (HOSPITAL_COMMUNITY): Payer: Self-pay

## 2024-03-28 ENCOUNTER — Ambulatory Visit (HOSPITAL_COMMUNITY)

## 2024-03-28 ENCOUNTER — Ambulatory Visit (HOSPITAL_COMMUNITY)
Admission: RE | Disposition: A | Discharge status: Home / Self Care | Payer: Self-pay | Source: Home / Self Care | Attending: Cardiology

## 2024-03-28 ENCOUNTER — Other Ambulatory Visit: Payer: Self-pay

## 2024-03-28 DIAGNOSIS — I4819 Other persistent atrial fibrillation: Secondary | ICD-10-CM | POA: Diagnosis present

## 2024-03-28 DIAGNOSIS — R140 Abdominal distension (gaseous): Secondary | ICD-10-CM | POA: Diagnosis not present

## 2024-03-28 DIAGNOSIS — Z87891 Personal history of nicotine dependence: Secondary | ICD-10-CM | POA: Insufficient documentation

## 2024-03-28 DIAGNOSIS — Z79899 Other long term (current) drug therapy: Secondary | ICD-10-CM | POA: Diagnosis not present

## 2024-03-28 DIAGNOSIS — I4891 Unspecified atrial fibrillation: Secondary | ICD-10-CM

## 2024-03-28 DIAGNOSIS — I5022 Chronic systolic (congestive) heart failure: Secondary | ICD-10-CM | POA: Diagnosis not present

## 2024-03-28 DIAGNOSIS — Z7901 Long term (current) use of anticoagulants: Secondary | ICD-10-CM | POA: Insufficient documentation

## 2024-03-28 DIAGNOSIS — I110 Hypertensive heart disease with heart failure: Secondary | ICD-10-CM | POA: Insufficient documentation

## 2024-03-28 DIAGNOSIS — E119 Type 2 diabetes mellitus without complications: Secondary | ICD-10-CM | POA: Diagnosis not present

## 2024-03-28 HISTORY — PX: ATRIAL FIBRILLATION ABLATION: EP1191

## 2024-03-28 LAB — GLUCOSE, CAPILLARY
Glucose-Capillary: 142 mg/dL — ABNORMAL HIGH (ref 70–99)
Glucose-Capillary: 157 mg/dL — ABNORMAL HIGH (ref 70–99)

## 2024-03-28 LAB — POCT ACTIVATED CLOTTING TIME: Activated Clotting Time: 348 s

## 2024-03-28 SURGERY — ATRIAL FIBRILLATION ABLATION
Anesthesia: General

## 2024-03-28 MED ORDER — SUGAMMADEX SODIUM 200 MG/2ML IV SOLN
INTRAVENOUS | Status: DC | PRN
  Administered 2024-03-28: 100 mg via INTRAVENOUS
  Administered 2024-03-28: 200 mg via INTRAVENOUS

## 2024-03-28 MED ORDER — ACETAMINOPHEN 500 MG PO TABS
1000.0000 mg | ORAL_TABLET | Freq: Once | ORAL | Status: AC
  Administered 2024-03-28: 1000 mg via ORAL
  Filled 2024-03-28: qty 2

## 2024-03-28 MED ORDER — PROPOFOL 10 MG/ML IV BOLUS
INTRAVENOUS | Status: DC | PRN
  Administered 2024-03-28: 170 mg via INTRAVENOUS

## 2024-03-28 MED ORDER — PROTAMINE SULFATE 10 MG/ML IV SOLN
INTRAVENOUS | Status: DC | PRN
  Administered 2024-03-28: 15 mg via INTRAVENOUS
  Administered 2024-03-28 (×2): 10 mg via INTRAVENOUS

## 2024-03-28 MED ORDER — ONDANSETRON HCL 4 MG/2ML IJ SOLN
INTRAMUSCULAR | Status: DC | PRN
  Administered 2024-03-28: 4 mg via INTRAVENOUS

## 2024-03-28 MED ORDER — PHENYLEPHRINE 80 MCG/ML (10ML) SYRINGE FOR IV PUSH (FOR BLOOD PRESSURE SUPPORT)
PREFILLED_SYRINGE | INTRAVENOUS | Status: DC | PRN
  Administered 2024-03-28: 160 ug via INTRAVENOUS

## 2024-03-28 MED ORDER — PANTOPRAZOLE SODIUM 40 MG PO TBEC
40.0000 mg | DELAYED_RELEASE_TABLET | Freq: Every day | ORAL | Status: DC
  Administered 2024-03-28: 40 mg via ORAL
  Filled 2024-03-28: qty 1

## 2024-03-28 MED ORDER — COLCHICINE 0.6 MG PO TABS
0.6000 mg | ORAL_TABLET | Freq: Two times a day (BID) | ORAL | Status: DC
  Administered 2024-03-28: 0.6 mg via ORAL
  Filled 2024-03-28: qty 1

## 2024-03-28 MED ORDER — HEPARIN (PORCINE) IN NACL 1000-0.9 UT/500ML-% IV SOLN
INTRAVENOUS | Status: DC | PRN
  Administered 2024-03-28 (×3): 500 mL

## 2024-03-28 MED ORDER — COLCHICINE 0.6 MG PO TABS
0.6000 mg | ORAL_TABLET | Freq: Two times a day (BID) | ORAL | 0 refills | Status: DC
Start: 2024-03-28 — End: 2024-04-25
  Filled 2024-03-28: qty 10, 5d supply, fill #0

## 2024-03-28 MED ORDER — ACETAMINOPHEN 325 MG PO TABS
650.0000 mg | ORAL_TABLET | ORAL | Status: DC | PRN
Start: 2024-03-28 — End: 2024-03-28

## 2024-03-28 MED ORDER — ATROPINE SULFATE 1 MG/10ML IJ SOSY
PREFILLED_SYRINGE | INTRAMUSCULAR | Status: AC
  Filled 2024-03-28: qty 30

## 2024-03-28 MED ORDER — SODIUM CHLORIDE 0.9% FLUSH: 3.0000 mL | Freq: Two times a day (BID) | INTRAVENOUS | Status: DC

## 2024-03-28 MED ORDER — SODIUM CHLORIDE 0.9 % IV SOLN: INTRAVENOUS | Status: DC

## 2024-03-28 MED ORDER — FENTANYL CITRATE (PF) 250 MCG/5ML IJ SOLN
INTRAMUSCULAR | Status: DC | PRN
  Administered 2024-03-28: 100 ug via INTRAVENOUS

## 2024-03-28 MED ORDER — DEXAMETHASONE SODIUM PHOSPHATE 10 MG/ML IJ SOLN
INTRAMUSCULAR | Status: DC | PRN
  Administered 2024-03-28: 10 mg via INTRAVENOUS

## 2024-03-28 MED ORDER — MIDAZOLAM HCL 2 MG/2ML IJ SOLN
INTRAMUSCULAR | Status: AC
  Filled 2024-03-28: qty 2

## 2024-03-28 MED ORDER — ONDANSETRON HCL 4 MG/2ML IJ SOLN: 4.0000 mg | Freq: Four times a day (QID) | INTRAMUSCULAR | Status: DC | PRN

## 2024-03-28 MED ORDER — HEPARIN SODIUM (PORCINE) 1000 UNIT/ML IJ SOLN
INTRAMUSCULAR | Status: AC
  Filled 2024-03-28: qty 40

## 2024-03-28 MED ORDER — FENTANYL CITRATE (PF) 100 MCG/2ML IJ SOLN
INTRAMUSCULAR | Status: AC
  Filled 2024-03-28: qty 2

## 2024-03-28 MED ORDER — ROCURONIUM BROMIDE 10 MG/ML (PF) SYRINGE
PREFILLED_SYRINGE | INTRAVENOUS | Status: DC | PRN
  Administered 2024-03-28: 60 mg via INTRAVENOUS
  Administered 2024-03-28: 20 mg via INTRAVENOUS

## 2024-03-28 MED ORDER — ATROPINE SULFATE 1 MG/10ML IJ SOSY
PREFILLED_SYRINGE | INTRAMUSCULAR | Status: DC | PRN
  Administered 2024-03-28: 1 mg via INTRAVENOUS

## 2024-03-28 MED ORDER — PHENYLEPHRINE HCL-NACL 20-0.9 MG/250ML-% IV SOLN
INTRAVENOUS | Status: DC | PRN
  Administered 2024-03-28: 50 ug/min via INTRAVENOUS

## 2024-03-28 MED ORDER — HEPARIN SODIUM (PORCINE) 1000 UNIT/ML IJ SOLN
INTRAMUSCULAR | Status: DC | PRN
  Administered 2024-03-28: 15000 [IU] via INTRAVENOUS

## 2024-03-28 MED ORDER — LIDOCAINE 2% (20 MG/ML) 5 ML SYRINGE
INTRAMUSCULAR | Status: DC | PRN
  Administered 2024-03-28: 20 mg via INTRAVENOUS

## 2024-03-28 MED ORDER — APIXABAN 5 MG PO TABS
5.0000 mg | ORAL_TABLET | Freq: Two times a day (BID) | ORAL | Status: DC
  Administered 2024-03-28: 5 mg via ORAL
  Filled 2024-03-28: qty 1

## 2024-03-28 SURGICAL SUPPLY — 19 items
BAG SNAP BAND KOVER 36X36 (MISCELLANEOUS) IMPLANT
BLANKET WARM UNDERBOD FULL ACC (MISCELLANEOUS) ×1 IMPLANT
CABLE FARASTAR GEN2 SNGL USE (CABLE) IMPLANT
CATH FARAWAVE NAV 31 (CATHETERS) IMPLANT
CATH GE 8FR SOUNDSTAR (CATHETERS) IMPLANT
CATH OCTARAY 2.0 F 3-3-3-3-3 (CATHETERS) IMPLANT
CATH WEBSTER BI DIR CS D-F CRV (CATHETERS) IMPLANT
CLOSURE PERCLOSE PROSTYLE (VASCULAR PRODUCTS) IMPLANT
COVER SWIFTLINK CONNECTOR (BAG) ×1 IMPLANT
DILATOR VESSEL 38 20CM 16FR (INTRODUCER) IMPLANT
GUIDEWIRE INQWIRE 1.5J.035X260 (WIRE) IMPLANT
KIT VERSACROSS CNCT FARADRIVE (KITS) IMPLANT
PACK EP LF (CUSTOM PROCEDURE TRAY) ×1 IMPLANT
PAD DEFIB RADIO PHYSIO CONN (PAD) ×1 IMPLANT
PATCH CARTO3 (PAD) IMPLANT
SHEATH FARADRIVE STEERABLE (SHEATH) IMPLANT
SHEATH PINNACLE 8F 10CM (SHEATH) IMPLANT
SHEATH PINNACLE 9F 10CM (SHEATH) IMPLANT
SHEATH PROBE COVER 6X72 (BAG) IMPLANT

## 2024-03-28 NOTE — H&P (Signed)
 Electrophysiology Office Follow up Visit Note:     Date:  03/28/2024    ID:  Christopher Navarro, DOB 05-31-1951, MRN [id number]   PCP:  Christopher Navarro ORN, MD           [facility] Cardiologist:  Christopher Swazil[place], MD  [facility] Electrophysiologist:  Christopher ONEIDA HOLTS, MD      Interval History:       [name] is a 73 y.o. male who presents for a follow up visit.    The patient was last seen by Christopher Navarro November 24, 2023.  He has a history of persistent atrial fibrillation, coronary artery disease, chronic systolic heart failure, hypertension, diabetes.  He had a prior catheter ablation for atrial fibrillation with Dr. [name] in 2021.  He also had a WPW ablation in 1997.  He saw Christopher Navarro in March of this year and reported an increased burden of atrial fibrillation despite taking Tikosyn .  At the appointment with Christopher Navarro on April 16 he reported continued episodes of symptomatic atrial fibrillation.  A heart monitor was ordered to quantify the amount of atrial fibrillation.   Today he is doing okay.  He says that his A-fib comes and goes.  He is able to capture it on his Christopher Navarro.  He tells me that sometimes when he lays down at night he will feel a bloated sensation in his epigastrium.  He has recently started taking an over-the-counter antacid.  He has previously had an EGD and colonoscopy which was unrevealing.  No chest pain.  He describes a pressure-like sensation when he goes into atrial fibrillation.  No syncope or presyncope.  He is doing well with his medications.  No missed doses of anticoagulant.  Presents for AF ablation. Procedure reviewed.   Objective Past medical, surgical, social and family history were reviewed.   ROS:   Please see the history of present illness.    All other systems reviewed and are negative.   EKGs/Labs/Other Studies Reviewed:     The following studies were reviewed today:    November 24, 2023 EKG shows sinus rhythm.  No preexcitation.  QTc 460 ms.    November 24, 2023 creatinine 1.01           Physical Exam:     VS:  BP 121/63 (BP Location: Left Arm, Patient Position: Sitting, Cuff Size: Large)   Pulse 52   Ht 6' 1 (1.854 m)   Wt 216 lb (98 kg)   SpO2 98%   BMI 28.50 kg/m         Wt Readings from Last 3 Encounters:  01/07/24 216 lb (98 kg)  11/24/23 218 lb (98.9 kg)  10/18/23 226 lb (102.5 kg)      GEN: no distress CARD: RRR, No MRG RESP: No IWOB. CTAB.     Assessment ASSESSMENT:     1. Persistent atrial fibrillation (HCC)   2. Chronic systolic CHF (congestive heart failure) (HCC)   3. Encounter for long-term (current) use of high-risk medication     PLAN:     In order of problems listed above:   #Persistent atrial fibrillation #High risk med monitoring-Tikosyn  The patient has residual symptomatic atrial fibrillation flutter despite treatment with Tikosyn  and a prior catheter ablation. He is currently on Tikosyn .  His QTc is acceptable for ongoing Tikosyn  use. He does have some ventricular ectopy but nothing sustained in some of the labeled NSVT episodes appear to be supraventricular in origin.   I discussed treatment  strategies with the patient during today's office visit including continuing with Tikosyn  versus pursuing catheter ablation for improved control of his arrhythmia.  He is interested in proceeding with catheter ablation.   Discussed treatment options today for AF including antiarrhythmic drug therapy and ablation. Discussed risks, recovery and likelihood of success with each treatment strategy. Risk, benefits, and alternatives to EP study and ablation for afib were discussed. These risks include but are not limited to stroke, bleeding, vascular damage, tamponade, perforation, damage to the esophagus, lungs, phrenic nerve and other structures, pulmonary vein stenosis, worsening renal function, coronary vasospasm and death.  Discussed potential need for repeat ablation procedures and antiarrhythmic drugs  after an initial ablation. The patient understands these risk and wishes to proceed.  We will therefore proceed with catheter ablation at the next available time.  Carto, ICE, anesthesia are requested for the procedure.  Will also obtain CT PV protocol prior to the procedure to exclude LAA thrombus and further evaluate atrial anatomy.   Continue Eliquis    #Bloated Trial of Protonix  40 mg by mouth once daily.  Follow-up with primary care physician.  Stop taking over-the-counter antacid.   #Chronic systolic heart failure Rhythm control indicated Continue Jardiance , Entresto , Coreg     Presents for AF ablation. Procedure reviewed.    Signed, Christopher Holts, MD, [facility], [facility] 03/28/2024 Electrophysiology [facility]

## 2024-03-28 NOTE — Anesthesia Procedure Notes (Signed)
 Procedure Name: Intubation Date/Time: 03/28/2024 7:39 AM  Performed by: Alen Motto D, CRNAPre-anesthesia Checklist: Patient identified, Emergency Drugs available, Suction available and Patient being monitored Patient Re-evaluated:Patient Re-evaluated prior to induction Oxygen Delivery Method: Circle System Utilized Preoxygenation: Pre-oxygenation with 100% oxygen Induction Type: IV induction Ventilation: Mask ventilation without difficulty and Oral airway inserted - appropriate to patient size Laryngoscope Size: Mac and 4 Grade View: Grade I Tube type: Oral Tube size: 7.5 mm Number of attempts: 1 Airway Equipment and Method: Stylet and Oral airway Placement Confirmation: ETT inserted through vocal cords under direct vision, positive ETCO2 and breath sounds checked- equal and bilateral Secured at: 22 cm Tube secured with: Tape Dental Injury: Teeth and Oropharynx as per pre-operative assessment

## 2024-03-28 NOTE — Anesthesia Preprocedure Evaluation (Signed)
 Anesthesia Evaluation  Patient identified by MRN, date of birth, ID band Patient awake    Reviewed: Allergy & Precautions, NPO status , Patient's Chart, lab work & pertinent test results  Airway Mallampati: II  TM Distance: >3 FB Neck ROM: Full    Dental  (+) Dental Advisory Given   Pulmonary former smoker   breath sounds clear to auscultation       Cardiovascular hypertension, Pt. on medications and Pt. on home beta blockers +CHF  + dysrhythmias Atrial Fibrillation  Rhythm:Regular Rate:Normal     Neuro/Psych negative neurological ROS     GI/Hepatic negative GI ROS, Neg liver ROS,,,  Endo/Other  diabetes, Type 2    Renal/GU negative Renal ROS     Musculoskeletal  (+) Arthritis ,    Abdominal   Peds  Hematology negative hematology ROS (+)   Anesthesia Other Findings   Reproductive/Obstetrics                             03/14/24 Echo IMPRESSIONS      1. No LAA clot seen. 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 3D image with no clot as well. Left atrial size was mildly dilated. No left atrial/left atrial appendage thrombus was detected. The LAA emptying velocity was 41 cm/s.  2. Left ventricular ejection fraction, by estimation, is 55 to 60%. The left ventricle has normal function. The left ventricle has no regional wall motion abnormalities.  3. Right ventricular systolic function is normal. The right ventricular size is normal. Tricuspid regurgitation signal is inadequate for assessing PA pressure.  4. The mitral valve is normal in structure. Mild to moderate mitral valve regurgitation.  5. The aortic valve is tricuspid. Aortic valve regurgitation is trivial.  6. 3D performed of the LAA  and demonstrates left atrial appendage. Anesthesia Physical Anesthesia Plan  ASA: 2  Anesthesia Plan: General   Post-op Pain Management: Tylenol  PO (pre-op)* and Minimal or no pain anticipated   Induction: Intravenous  PONV Risk Score and Plan: 2 and Dexamethasone , Ondansetron  and Treatment may vary due to age or medical condition  Airway Management Planned: Oral ETT  Additional Equipment: None  Intra-op Plan:   Post-operative Plan: Extubation in OR  Informed Consent: I have reviewed the patients History and Physical, chart, labs and discussed the procedure including the risks, benefits and alternatives for the proposed anesthesia with the patient or authorized representative who has indicated his/her understanding and acceptance.     Dental advisory given  Plan Discussed with: CRNA  Anesthesia Plan Comments:          Anesthesia Quick Evaluation

## 2024-03-28 NOTE — Discharge Instructions (Signed)

## 2024-03-28 NOTE — Transfer of Care (Signed)
 Immediate Anesthesia Transfer of Care Note  Patient: Christopher Navarro  Procedure(s) Performed: ATRIAL FIBRILLATION ABLATION  Patient Location: PACU  Anesthesia Type:General  Level of Consciousness: awake, alert , and oriented  Airway & Oxygen Therapy: Patient Spontanous Breathing and Patient connected to face mask oxygen  Post-op Assessment: Report given to RN and Post -op Vital signs reviewed and stable  Post vital signs: Reviewed and stable  Last Vitals:  Vitals Value Taken Time  BP 146/84 03/28/24 08:48  Temp    Pulse 67 03/28/24 08:49  Resp 12 03/28/24 08:49  SpO2 100 % 03/28/24 08:49  Vitals shown include unfiled device data.  Last Pain:  Vitals:   03/28/24 0649  PainSc: 0-No pain         Complications: No notable events documented.

## 2024-03-28 NOTE — Progress Notes (Signed)
 Discharge instructions reviewed with patient and daughter at bedside. Denies questions or concerns at this time. PT ambulated to the bathroom with nurse where he was able to void. Incisions remained C/D/I, no S/S of complications at incision site. IV site removed. PT escorted from unit via wheelchair to personal vehicle driven by his daughter.

## 2024-03-29 ENCOUNTER — Telehealth (HOSPITAL_COMMUNITY): Payer: Self-pay

## 2024-03-29 ENCOUNTER — Encounter (HOSPITAL_COMMUNITY): Payer: Self-pay | Admitting: Cardiology

## 2024-03-29 NOTE — Telephone Encounter (Signed)
 Spoke with patient to complete post procedure follow up call.  Patient reports no complications with groin sites. He c/o redness and irritation on his back from defibrillator pad. Advised patient he can apply cool compress and apply aloe vera gel to help soothe skin.   Instructions reviewed with patient:  Remove large bandage at puncture site after 24 hours. It is normal to have bruising, tenderness, mild swelling, and a pea or marble sized lump/knot at the groin site which can take up to three months to resolve.  Get help right away if you notice sudden swelling at the puncture site.  Check your puncture site every day for signs of infection: fever, redness, swelling, pus drainage, warmth, foul odor or excessive pain. If this occurs, please call the office at [phone], to speak with the nurse. Get help right away if your puncture site is bleeding and the bleeding does not stop after applying firm pressure to the area.  You may continue to have skipped beats/ atrial fibrillation during the first several months after your procedure.  It is very important not to miss any doses of your blood thinner Eliquis .   You will follow up with the Afib clinic on 04/25/24 and follow up with the APP on 07/03/24.   Patient verbalized understanding to all instructions provided.

## 2024-03-29 NOTE — Anesthesia Postprocedure Evaluation (Signed)
 Anesthesia Post Note  Patient: [name]  Procedure(s) Performed: ATRIAL FIBRILLATION ABLATION     Patient location during evaluation: PACU Anesthesia Type: General Level of consciousness: awake and alert Pain management: pain level controlled Vital Signs Assessment: post-procedure vital signs reviewed and stable Respiratory status: spontaneous breathing, nonlabored ventilation, respiratory function stable and patient connected to nasal cannula oxygen Cardiovascular status: blood pressure returned to baseline and stable Postop Assessment: no apparent nausea or vomiting Anesthetic complications: no   No notable events documented.  Last Vitals:  Vitals:   03/28/24 1030 03/28/24 1100  BP: (!) 122/58 133/67  Pulse: (!) 59 65  Resp: 18 16  Temp:    SpO2: 98% 97%    Last Pain:  Vitals:   03/28/24 0916  TempSrc: Oral  PainSc: 0-No pain                 Epifanio Lamar BRAVO

## 2024-03-31 MED FILL — Fentanyl Citrate Preservative Free (PF) Inj 100 MCG/2ML: INTRAMUSCULAR | Qty: 2 | Status: AC

## 2024-04-24 ENCOUNTER — Inpatient Hospital Stay: Admitting: Family Medicine

## 2024-04-25 ENCOUNTER — Ambulatory Visit (HOSPITAL_COMMUNITY)
Admit: 2024-04-25 | Discharge: 2024-04-25 | Disposition: A | Discharge status: Home / Self Care | Attending: Physician Assistant | Admitting: Physician Assistant

## 2024-04-25 VITALS — BP 150/76 | HR 54 | Ht 73.0 in | Wt 220.2 lb

## 2024-04-25 DIAGNOSIS — D6869 Other thrombophilia: Secondary | ICD-10-CM | POA: Diagnosis not present

## 2024-04-25 DIAGNOSIS — I4891 Unspecified atrial fibrillation: Secondary | ICD-10-CM

## 2024-04-25 DIAGNOSIS — Z5181 Encounter for therapeutic drug level monitoring: Secondary | ICD-10-CM | POA: Diagnosis not present

## 2024-04-25 DIAGNOSIS — I4819 Other persistent atrial fibrillation: Secondary | ICD-10-CM

## 2024-04-25 DIAGNOSIS — Z79899 Other long term (current) drug therapy: Secondary | ICD-10-CM

## 2024-04-25 NOTE — Progress Notes (Signed)
 Primary Care Physician: Christopher Navarro ORN, MD Primary Cardiologist: Christopher Sw[place], MD Electrophysiologist: Christopher ONEIDA HOLTS, MD  Referring Physician: Dr Christopher Navarro is a 73 y.o. male with a history of CAD, CHF, HTN, DM, atrial fibrillation who presents for follow up in the [facility]. He had a prior catheter ablation for atrial fibrillation with Dr. [name] in 2021. He also had a WPW ablation in 1997. He was maintained on dofetilide  but had symptomatic recurrence of his afib. He was seen by Dr Navarro and underwent repeat afib ablation on 03/28/24. Patient is on Eliquis  for stroke prevention.    Patient presents today for follow up for atrial fibrillation and dofetilide  monitoring. He remains in SR today and feels well. He denies any episodes of afib since the ablation. He denies chest pain or groin issues. No bleeding issues on anticoagulation.   Today, he denies symptoms of palpitations, chest pain, shortness of breath, orthopnea, PND, lower extremity edema, dizziness, presyncope, syncope, snoring, daytime somnolence, bleeding, or neurologic sequela. The patient is tolerating medications without difficulties and is otherwise without complaint today.    Atrial Fibrillation Risk Factors:  he does not have symptoms or diagnosis of sleep apnea. he does not have a history of rheumatic fever.   Atrial Fibrillation Management history:  Previous antiarrhythmic drugs: dofetilide   Previous cardioversions: 2020 x 2 Previous ablations: WPW 1997, 03/26/20, 03/28/24 Anticoagulation history: Eliquis   ROS- All systems are reviewed and negative except as per the HPI above.  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 (Patient taking differently: Take 1,000 mg by mouth as needed for mild pain (pain score 1-3) or moderate pain (pain score 4-6).)     carvedilol  (COREG ) 12.5 MG tablet TAKE 1 TABLET BY MOUTH TWICE A DAY 180 tablet 3   dofetilide  (TIKOSYN ) 500 MCG capsule TAKE 1 CAPSULE BY MOUTH TWICE A DAY 180 capsule 3   ELIQUIS  5 MG TABS tablet TAKE 1 TABLET BY MOUTH TWICE A DAY 180 tablet 1   ENTRESTO  24-26 MG TAKE 1 TABLET BY MOUTH TWICE A DAY 180 tablet 3   JARDIANCE  10 MG TABS tablet TAKE 1 TABLET BY MOUTH EVERY DAY 90 tablet 3   Magnesium  250 MG TABS Take 250 mg by mouth 3 (three) times a week.     metFORMIN  (GLUCOPHAGE -XR) 750 MG 24 hr tablet TAKE 1 TABLET BY MOUTH EVERY DAY WITH BREAKFAST 30 tablet 0   Multiple Vitamin (MULTIVITAMIN WITH MINERALS) TABS tablet Take 1 tablet by mouth daily. Senior     pantoprazole  (PROTONIX ) 40 MG tablet Take 1 tablet (40 mg total) by mouth daily. 90 tablet 3   rosuvastatin  (CRESTOR ) 20 MG tablet TAKE 1 TABLET BY MOUTH EVERY DAY 90 tablet 3   No current facility-administered medications for this encounter.    Physical Exam: BP (!) 150/76   Pulse (!) 54   Ht 6' 1 (1.854 m)   Wt 99.9 kg   BMI 29.05 kg/m   GEN: Well nourished, well developed in no acute distress CARDIAC: Regular rate and rhythm, no  murmurs, rubs, gallops RESPIRATORY:  Clear to auscultation without rales, wheezing or rhonchi  ABDOMEN: Soft, non-tender, non-distended EXTREMITIES:  No edema; No deformity   Wt Readings from Last 3 Encounters:  04/25/24 99.9 kg  03/28/24 97.5 kg  03/14/24 98.9 kg     EKG today demonstrates  SB Vent. rate 54 BPM PR interval 178 ms QRS duration 86 ms QT/QTcB 458/434 ms   Echo 10/02/20 demonstrated   1. Left ventricular ejection fraction, by estimation, is 55 to 60%. The  left ventricle has normal function. The left ventricle has no  regional  wall motion abnormalities.   2. E/A ratio >2.2 which would be consistent with restrictive filling,  however, tissue dopplers normal and no other parameters suggestive of  restrictive process. Suspect impaired relaxation without restriction.   3. Right ventricular systolic function is normal. The right ventricular  size is normal.   4. The mitral valve is normal in structure. Trivial mitral valve  regurgitation.   5. The aortic valve is tricuspid. Aortic valve regurgitation is not  visualized. No aortic stenosis is present.   6. The inferior vena cava is normal in size with greater than 50%  respiratory variability, suggesting right atrial pressure of 3 mmHg.   Comparison(s): No significant change from prior study.    CHA2DS2-VASc Score = 5  The patient's score is based upon: CHF History: 1 HTN History: 1 Diabetes History: 1 Stroke History: 0 Vascular Disease History: 0 Age Score: 2 Gender Score: 0       ASSESSMENT AND PLAN: Persistent Atrial Fibrillation (ICD10:  I48.19) The patient's CHA2DS2-VASc score is 5, indicating a 7.2% annual risk of stroke.   S/p WPW ablation 1997, afib ablation 03/26/20 and 03/28/24 Patient appears to be maintaining SR Continue dofetilide  500 mcg BID Continue carvedilol  12.5 mg BID Continue Eliquis  5 mg BID with no missed doses for 3 months post ablation.   Secondary Hypercoagulable State (ICD10:  D68.69) The patient is at significant risk for stroke/thromboembolism based upon his CHA2DS2-VASc Score of 5.  Continue Apixaban  (Eliquis ). No bleeding issues.   High Risk Medication Monitoring (ICD 10: J342684) Patient requires ongoing monitoring for anti-arrhythmic medication which has the potential to cause life threatening arrhythmias. QT interval on ECG acceptable for dofetilide  monitoring. He is due for labs for his annual physical later this month. He will have bmet/mag checked at that time.   HFrecEF EF 55-60% GDMT per primary  cardiology team Fluid status appears stable today   Follow up with Christopher Navarro as scheduled.     [facility] [address] [place],  [address] [phone]

## 2024-05-04 NOTE — Progress Notes (Addendum)
 DIONDRE PULIS                                          MRN: [id number]   05/04/2024   The [facility] Quality Team Specialist reviewed this patient medical record for the purposes of chart review for care gap closure. The following were reviewed: chart review for care gap closure-diabetic eye exam, glycemic status assessment, and kidney health evaluation for diabetes:eGFR  and uACR.  08/07/2024- KED closure confirmed in [facility] portal. No new GSD to close    [facility] Quality Team

## 2024-05-06 ENCOUNTER — Other Ambulatory Visit: Payer: Self-pay | Admitting: Cardiology

## 2024-05-06 DIAGNOSIS — I4819 Other persistent atrial fibrillation: Secondary | ICD-10-CM

## 2024-05-08 NOTE — Telephone Encounter (Signed)
 Eliquis  5mg  refill request received. Patient is 73 years old, weight-99.9kg, Crea-0.93 on 03/21/24, Diagnosis-Afib, and last seen by Quita Kicks on 04/25/24. Dose is appropriate based on dosing criteria. Will send in refill to requested pharmacy.

## 2024-05-09 ENCOUNTER — Ambulatory Visit: Payer: Self-pay | Admitting: Family Medicine

## 2024-05-09 ENCOUNTER — Ambulatory Visit: Admitting: Family Medicine

## 2024-05-09 VITALS — BP 132/70 | HR 63 | Temp 97.8°F | Ht 73.23 in | Wt 215.4 lb

## 2024-05-09 DIAGNOSIS — Z7984 Long term (current) use of oral hypoglycemic drugs: Secondary | ICD-10-CM

## 2024-05-09 DIAGNOSIS — Z79899 Other long term (current) drug therapy: Secondary | ICD-10-CM

## 2024-05-09 DIAGNOSIS — Z8679 Personal history of other diseases of the circulatory system: Secondary | ICD-10-CM

## 2024-05-09 DIAGNOSIS — Z0000 Encounter for general adult medical examination without abnormal findings: Secondary | ICD-10-CM

## 2024-05-09 DIAGNOSIS — I10 Essential (primary) hypertension: Secondary | ICD-10-CM | POA: Diagnosis not present

## 2024-05-09 DIAGNOSIS — L723 Sebaceous cyst: Secondary | ICD-10-CM

## 2024-05-09 DIAGNOSIS — E785 Hyperlipidemia, unspecified: Secondary | ICD-10-CM

## 2024-05-09 DIAGNOSIS — E1165 Type 2 diabetes mellitus with hyperglycemia: Secondary | ICD-10-CM | POA: Diagnosis not present

## 2024-05-09 LAB — CBC WITH DIFFERENTIAL/PLATELET
Basophils Absolute: 0 K/uL (ref 0.0–0.1)
Basophils Relative: 0.6 % (ref 0.0–3.0)
Eosinophils Absolute: 0.2 K/uL (ref 0.0–0.7)
Eosinophils Relative: 2.4 % (ref 0.0–5.0)
HCT: 49.4 % (ref 39.0–52.0)
Hemoglobin: 16.2 g/dL (ref 13.0–17.0)
Lymphocytes Relative: 25.6 % (ref 12.0–46.0)
Lymphs Abs: 1.8 K/uL (ref 0.7–4.0)
MCHC: 32.8 g/dL (ref 30.0–36.0)
MCV: 82.6 fl (ref 78.0–100.0)
Monocytes Absolute: 0.6 K/uL (ref 0.1–1.0)
Monocytes Relative: 9.1 % (ref 3.0–12.0)
Neutro Abs: 4.3 K/uL (ref 1.4–7.7)
Neutrophils Relative %: 62.3 % (ref 43.0–77.0)
Platelets: 261 K/uL (ref 150.0–400.0)
RBC: 5.99 Mil/uL — ABNORMAL HIGH (ref 4.22–5.81)
RDW: 15.1 % (ref 11.5–15.5)
WBC: 6.9 K/uL (ref 4.0–10.5)

## 2024-05-09 LAB — COMPREHENSIVE METABOLIC PANEL WITH GFR
ALT: 16 U/L (ref 0–53)
AST: 21 U/L (ref 0–37)
Albumin: 4.5 g/dL (ref 3.5–5.2)
Alkaline Phosphatase: 77 U/L (ref 39–117)
BUN: 18 mg/dL (ref 6–23)
CO2: 31 meq/L (ref 19–32)
Calcium: 9.5 mg/dL (ref 8.4–10.5)
Chloride: 100 meq/L (ref 96–112)
Creatinine, Ser: 0.86 mg/dL (ref 0.40–1.50)
GFR: 84.57 mL/min (ref >60.00)
Glucose, Bld: 110 mg/dL — ABNORMAL HIGH (ref 70–99)
Potassium: 4.4 meq/L (ref 3.5–5.1)
Sodium: 139 meq/L (ref 135–145)
Total Bilirubin: 0.9 mg/dL (ref 0.2–1.2)
Total Protein: 7.3 g/dL (ref 6.0–8.3)

## 2024-05-09 LAB — MICROALBUMIN / CREATININE URINE RATIO
Creatinine,U: 62.4 mg/dL
Microalb Creat Ratio: 26.6 mg/g (ref 0.0–30.0)
Microalb, Ur: 1.7 mg/dL (ref 0.0–1.9)

## 2024-05-09 LAB — LIPID PANEL
Cholesterol: 122 mg/dL (ref 0–200)
HDL: 54 mg/dL (ref >39.00)
LDL Cholesterol: 50 mg/dL (ref 0–99)
NonHDL: 68.18
Total CHOL/HDL Ratio: 2
Triglycerides: 92 mg/dL (ref 0.0–149.0)
VLDL: 18.4 mg/dL (ref 0.0–40.0)

## 2024-05-09 LAB — MAGNESIUM: Magnesium: 1.9 mg/dL (ref 1.5–2.5)

## 2024-05-09 LAB — HEMOGLOBIN A1C: Hgb A1c MFr Bld: 7.3 % — ABNORMAL HIGH (ref 4.6–6.5)

## 2024-05-09 MED ORDER — ROSUVASTATIN CALCIUM 20 MG PO TABS: 20.0000 mg | ORAL_TABLET | Freq: Every day | ORAL | 3 refills | Status: AC

## 2024-05-09 MED ORDER — METFORMIN HCL ER 750 MG PO TB24: 750.0000 mg | ORAL_TABLET | Freq: Every day | ORAL | 3 refills | Status: AC

## 2024-05-09 NOTE — Progress Notes (Signed)
 Established Patient Office Visit  Subjective   Patient ID: Christopher Navarro, male    DOB: Apr 17, 1951  Age: 73 y.o. MRN: [id number]  Chief Complaint  Patient presents with   Annual Exam    HPI   [name] is here today for physical exam.  He has history of atrial fibrillation, systolic heart failure, hypertension, type 2 diabetes, hyperlipidemia.  He had repeat ablation procedure for A-fib recently back in August and thus far has been in sinus rhythm.  Generally doing well.  Remains on Tikosyn  and Eliquis .  Overdue for labs.  Not monitoring blood sugars regularly.  Diabetes been well-controlled in the past.  He remains on rosuvastatin  20 mg daily for hyperlipidemia.  Other medications include carvedilol , Jardiance , Entresto , pantoprazole  in addition to medication as above.  He has large sebaceous cyst right anterior chest wall which has been present for several years slowly growing in size.  He feels pressure when he takes a deep breath in at times.  No recent drainage.  No history of infection.  Health maintenance reviewed:  Health Maintenance  Topic Date Due   Diabetic kidney evaluation - Urine ACR  Never done   Medicare Annual Wellness (AWV)  08/28/2014   COVID-19 Vaccine (3 - Moderna risk series) 11/08/2019   DTaP/Tdap/Td (3 - Tdap) 06/10/2020   FOOT EXAM  02/22/2021   HEMOGLOBIN A1C  02/15/2023   OPHTHALMOLOGY EXAM  04/09/2023   Zoster Vaccines- Shingrix (2 of 2) 03/03/2024   Diabetic kidney evaluation - eGFR measurement  03/21/2025   Colonoscopy  12/12/2030   Pneumococcal Vaccine: 50+ Years  Completed   Influenza Vaccine  Completed   Hepatitis C Screening  Completed   HPV VACCINES  Aged Out   Meningococcal B Vaccine  Aged Out   Social History   Socioeconomic History   Marital status: Married    Spouse name: Not on file   Number of children: Not on file   Years of education: Not on file   Highest education level: Associate degree: academic program  Occupational History    Not on file  Tobacco Use   Smoking status: Former   Smokeless tobacco: Never  Vaping Use   Vaping status: Never Used  Substance and Sexual Activity   Alcohol use: No   Drug use: No   Sexual activity: Not on file  Other Topics Concern   Not on file  Social History Narrative   retired   Chief Executive Officer Drivers of Corporate investment banker Strain: Low Risk  (05/09/2024)   Overall Financial Resource Strain (CARDIA)    Difficulty of Paying Living Expenses: Not hard at all  Food Insecurity: No Food Insecurity (05/09/2024)   Hunger Vital Sign    Worried About Running Out of Food in the Last Year: Never true    Ran Out of Food in the Last Year: Never true  Transportation Needs: No Transportation Needs (05/09/2024)   PRAPARE - Administrator, Civil Service (Medical): No    Lack of Transportation (Non-Medical): No  Physical Activity: Sufficiently Active (05/09/2024)   Exercise Vital Sign    Days of Exercise per Week: 5 days    Minutes of Exercise per Session: 50 min  Stress: No Stress Concern Present (05/09/2024)   [employer] of Occupational Health - Occupational Stress Questionnaire    Feeling of Stress: Only a little  Social Connections: Socially Integrated (05/09/2024)   Social Connection and Isolation Panel    Frequency of Communication with Friends  and Family: More than three times a week    Frequency of Social Gatherings with Friends and Family: More than three times a week    Attends Religious Services: More than 4 times per year    Active Member of Clubs or Organizations: Yes    Attends Engineer, structural: More than 4 times per year    Marital Status: Married  Catering manager Violence: Not on file   Family History  Problem Relation Age of Onset   Arthritis Other    Hyperlipidemia Other    Hypertension Other    Stroke Mother    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   Diabetes  mellitus type II    Dyslipidemia    FASCIITIS, PLANTAR 06/20/2009   HYPERTENSION 06/20/2009   Past Surgical History:  Procedure Laterality Date   ablasion  1997   ATRIAL FIBRILLATION ABLATION N/A 03/26/2020   Procedure: ATRIAL FIBRILLATION ABLATION;  Surgeon: [name] Agent, MD;  Location: [facility] INVASIVE CV LAB;  Service: Cardiovascular;  Laterality: N/A;   ATRIAL FIBRILLATION ABLATION N/A 03/28/2024   Procedure: ATRIAL FIBRILLATION ABLATION;  Surgeon: Cindie Ole DASEN, MD;  Location: [facility] INVASIVE CV LAB;  Service: Cardiovascular;  Laterality: N/A;   BACK SURGERY  2006   CARDIOVERSION N/A 08/15/2018   Procedure: CARDIOVERSION;  Surgeon: Okey Vina GAILS, MD;  Location: [facility] ENDOSCOPY;  Service: Cardiovascular;  Laterality: N/A;   CARDIOVERSION N/A 10/20/2018   Procedure: CARDIOVERSION;  Surgeon: Lonni Slain, MD;  Location: [facility] ENDOSCOPY;  Service: Cardiovascular;  Laterality: N/A;   CARPAL TUNNEL RELEASE  2004   Bilateral   KNEE ARTHROSCOPY  2002   RIGHT/LEFT HEART CATH AND CORONARY ANGIOGRAPHY N/A 09/15/2018   Procedure: RIGHT/LEFT HEART CATH AND CORONARY ANGIOGRAPHY;  Surgeon: Swaziland, Peter M, MD;  Location: [facility] INVASIVE CV LAB;  Service: Cardiovascular;  Laterality: N/A;   TRANSESOPHAGEAL ECHOCARDIOGRAM (CATH LAB) N/A 03/14/2024   Procedure: TRANSESOPHAGEAL ECHOCARDIOGRAM;  Surgeon: Sheena Pugh, DO;  Location: [facility] INVASIVE CV LAB;  Service: Cardiovascular;  Laterality: N/A;    reports that he has quit smoking. He has never used smokeless tobacco. He reports that he does not drink alcohol and does not use drugs. family history includes Arthritis in an other family member; Hyperlipidemia in an other family member; Hypertension in an other family member; Stroke in his mother. Allergies  Allergen Reactions   Ciprofloxacin Hives, Itching and Rash    Review of Systems  Constitutional:  Negative for chills, fever and malaise/fatigue.  Eyes:  Negative for blurred vision.  Respiratory:  Negative  for cough and shortness of breath.   Cardiovascular:  Negative for chest pain and palpitations.  Gastrointestinal:  Negative for abdominal pain.  Genitourinary:  Negative for dysuria.  Neurological:  Negative for dizziness, weakness and headaches.      Objective:     BP 132/70   Pulse 63   Temp 97.8 F (36.6 C) (Oral)   Ht 6' 1.23 (1.86 m)   Wt 215 lb 6.4 oz (97.7 kg)   SpO2 97%   BMI 28.24 kg/m  BP Readings from Last 3 Encounters:  05/09/24 132/70  04/25/24 (!) 150/76  03/28/24 133/67   Wt Readings from Last 3 Encounters:  05/09/24 215 lb 6.4 oz (97.7 kg)  04/25/24 220 lb 3.2 oz (99.9 kg)  03/28/24 215 lb (97.5 kg)      Physical Exam Vitals reviewed.  Constitutional:  General: He is not in acute distress.    Appearance: He is well-developed.  HENT:     Head: Normocephalic and atraumatic.     Right Ear: External ear normal.     Left Ear: External ear normal.  Eyes:     Conjunctiva/sclera: Conjunctivae normal.     Pupils: Pupils are equal, round, and reactive to light.  Neck:     Thyroid : No thyromegaly.  Cardiovascular:     Rate and Rhythm: Normal rate and regular rhythm.     Heart sounds: Normal heart sounds. No murmur heard. Pulmonary:     Effort: No respiratory distress.     Breath sounds: No wheezing or rales.  Abdominal:     General: Bowel sounds are normal. There is no distension.     Palpations: Abdomen is soft. There is no mass.     Tenderness: There is no abdominal tenderness. There is no guarding or rebound.  Musculoskeletal:     Cervical back: Normal range of motion and neck supple.  Lymphadenopathy:     Cervical: No cervical adenopathy.  Skin:    Findings: No rash.     Comments: Very large sebaceous cyst right upper anterior chest wall which is 7 x 9 cm.  Nontender.  Neurological:     Mental Status: He is alert and oriented to person, place, and time.     Cranial Nerves: No cranial nerve deficit.     Deep Tendon Reflexes: Reflexes  normal.      No results found for any visits on 05/09/24.    The ASCVD Risk score (Arnett DK, et al., 2019) failed to calculate for the following reasons:   The valid total cholesterol range is 130 to 320 mg/dL   Unable to determine if patient is Non-Hispanic African American    Assessment & Plan:   73 year old male with chronic problems as above here for physical exam.  He has very large sebaceous cyst right anterior chest wall and would like to have referral to consider surgical excision.  We discussed several health maintenance issues as follows  -Flu vaccine already given - Needs diabetic eye exam and he will set up - Consider Shingrix vaccine at some point this year - Pneumonia vaccine up-to-date - Obtain multiple labs including lipid, hepatic, basic metabolic panel, CBC, magnesium  level, A1c, urine microalbumin screen - Recommend minimum 150 minutes/week of moderate intensity exercise such as brisk walking - General Surgery consult obtained for large sebaceous cyst chest wall   No follow-ups on file.    Wolm Scarlet, MD

## 2024-05-09 NOTE — Patient Instructions (Signed)
 Set up repeat diabetic eye exam  Consider Shingrix vaccine at some point this year- might want to check on insurance coverage first.

## 2024-05-19 ENCOUNTER — Telehealth: Payer: Self-pay | Admitting: *Deleted

## 2024-05-19 ENCOUNTER — Ambulatory Visit: Payer: Self-pay | Admitting: Surgery

## 2024-05-19 NOTE — H&P (Signed)
 Subjective    Chief Complaint: Inguinal Hernia       History of Present Illness: Christopher Navarro is a 73 y.o. male who is seen today as an office consultation at the request of Dr. Micheal for evaluation of Inguinal Hernia .   This is a 73 year old male with multiple medical issues including atrial fibrillation on Eliquis  who presents with several years of an enlarging sebaceous cyst on his anterior chest.  This has never become infected.  However, it continues to enlarge and is causing some discomfort.  Recently he brought this to the attention of Dr. Micheal who recommended that he have a surgical consultation for excision.  His last attempt at ablation for atrial fibrillation was in August of this year.     Review of Systems: A complete review of systems was obtained from the patient.  I have reviewed this information and discussed as appropriate with the patient.  See HPI as well for other ROS.   Review of Systems  Constitutional: Negative.   HENT:  Positive for tinnitus.   Eyes: Negative.   Respiratory: Negative.    Cardiovascular: Negative.   Gastrointestinal: Negative.   Genitourinary: Negative.   Musculoskeletal: Negative.   Skin: Negative.   Neurological: Negative.   Endo/Heme/Allergies: Negative.   Psychiatric/Behavioral: Negative.          Medical History: Past Medical History      Past Medical History:  Diagnosis Date   Arrhythmia     CHF (congestive heart failure) (CMS/HHS-HCC)     Diabetes mellitus without complication (CMS/HHS-HCC)          Problem List     Patient Active Problem List  Diagnosis   Chronic systolic CHF (congestive heart failure) (CMS/HHS-HCC)   Dyslipidemia   Dysphagia   Essential hypertension   Gastroesophageal reflux disease   Obesity (BMI 30-39.9), unspecified   Atrial fibrillation (CMS/HHS-HCC)   Secondary hypercoagulable state (HHS-HCC)   Type 2 diabetes mellitus, controlled (CMS/HHS-HCC)        Past Surgical History        Past Surgical History:  Procedure Laterality Date   ABLATION ARRYTHMIA FOCUS       back surgery       ENDOSCOPIC CARPAL TUNNEL RELEASE            Allergies      Allergies  Allergen Reactions   Ciprofloxacin Hives, Itching and Rash        Medications Ordered Prior to Encounter        Current Outpatient Medications on File Prior to Visit  Medication Sig Dispense Refill   dofetilide  (TIKOSYN ) 500 MCG capsule Take 500 mcg by mouth 2 (two) times daily       ELIQUIS  5 mg tablet Take 5 mg by mouth 2 (two) times daily       ENTRESTO  24-26 mg tablet Take 1 tablet by mouth 2 (two) times daily       JARDIANCE  10 mg tablet Take 10 mg by mouth once daily       metFORMIN  (GLUCOPHAGE -XR) 750 MG XR tablet Take 750 mg by mouth       pantoprazole  (PROTONIX ) 40 MG DR tablet Take 40 mg by mouth once daily       rosuvastatin  (CRESTOR ) 20 MG tablet Take 20 mg by mouth once daily       acetaminophen  (TYLENOL ) 500 MG tablet Take 1,000 mg by mouth       magnesium  250 mg Tab Take 250 mg by  mouth       multivitamin with minerals tablet Take 1 tablet by mouth        No current facility-administered medications on file prior to visit.        Family History       Family History  Problem Relation Age of Onset   Stroke Mother     Obesity Mother     High blood pressure (Hypertension) Mother     Diabetes Mother     Coronary Artery Disease (Blocked arteries around heart) Father     High blood pressure (Hypertension) Sister     Skin cancer Brother     Coronary Artery Disease (Blocked arteries around heart) Brother     Diabetes Brother          Tobacco Use History  Social History       Tobacco Use  Smoking Status Never  Smokeless Tobacco Never        Social History  Social History        Socioeconomic History   Marital status: Married  Tobacco Use   Smoking status: Never   Smokeless tobacco: Never  Vaping Use   Vaping status: Never Used  Substance and Sexual Activity   Drug  use: Never    Social Drivers of Acupuncturist Strain: Low Risk  (05/09/2024)    Received from [facility]    Overall Financial Resource Strain (CARDIA)     How hard is it for you to pay for the very basics like food, housing, medical care, and heating?: Not hard at all  Food Insecurity: No Food Insecurity (05/09/2024)    Received from [facility]    Hunger Vital Sign     Within the past 12 months, you worried that your food would run out before you got the money to buy more.: Never true     Within the past 12 months, the food you bought just didn't last and you didn't have money to get more.: Never true  Transportation Needs: No Transportation Needs (05/09/2024)    Received from [facility] - [facility]     In the past 12 months, has lack of transportation kept you from medical appointments or from getting medications?: No     In the past 12 months, has lack of transportation kept you from meetings, work, or from getting things needed for daily living?: No  Physical Activity: Sufficiently Active (05/09/2024)    Received from [facility]    Exercise Vital Sign     On average, how many days per week do you engage in moderate to strenuous exercise (like a brisk walk)?: 5 days     On average, how many minutes do you engage in exercise at this level?: 50 min  Stress: No Stress Concern Present (05/09/2024)    Received from [facility]ational [facility] - Occupational Stress Questionnaire     Do you feel stress - tense, restless, nervous, or anxious, or unable to sleep at night because your mind is troubled all the time - these days?: Only a little  Social Connections: Socially Integrated (05/09/2024)    Received from [facility]    Social Connection and Isolation Panel     In a typical week, how many times do you talk on the phone with family, friends, or neighbors?: More than three times a week     How often  do you get together with  friends or relatives?: More than three times a week     How often do you attend church or religious services?: More than 4 times per year     Do you belong to any clubs or organizations such as church groups, unions, fraternal or athletic groups, or school groups?: Yes     How often do you attend meetings of the clubs or organizations you belong to?: More than 4 times per year     Are you married, widowed, divorced, separated, never married, or living with a partner?: Married  Housing Stability: Unknown (05/19/2024)    Housing Stability Vital Sign     Homeless in the Last Year: No        Objective:          Vitals:    05/19/24 1006 05/19/24 1007  BP: 138/73    Pulse: 72    Temp: 36.6 C (97.8 F)    SpO2: 98%    Weight: 99.3 kg (219 lb)    Height: 185.4 cm (6' 1)    PainSc:   0-No pain    Body mass index is 28.89 kg/m.   Physical Exam    Constitutional:  WDWN in NAD, conversant, no obvious deformities; lying in bed comfortably Eyes:  Pupils equal, round; sclera anicteric; moist conjunctiva; no lid lag HENT:  Oral mucosa moist; good dentition  Neck:  No masses palpated, trachea midline; no thyromegaly Lungs:  CTA bilaterally; normal respiratory effort Mid chest just to the right of midline, there is a protruding 7 cm subcutaneous cyst.  There is an obvious punctate opening but there is no drainage.  No sign of infection. CV:  Regular rate and rhythm; no murmurs; extremities well-perfused with no edema Abd:  +bowel sounds, soft, non-tender, no palpable organomegaly; no palpable hernias Musc: Normal gait; no apparent clubbing or cyanosis in extremities Lymphatic:  No palpable cervical or axillary lymphadenopathy Skin:  Warm, dry; no sign of jaundice Psychiatric - alert and oriented x 4; calm mood and affect       Assessment and Plan:  Diagnoses and all orders for this visit:   Sebaceous cyst   Anterior chest wall 7 cm   Recommend excision of sebaceous cyst of the  anterior chest wall under anesthesia.The surgical procedure has been discussed with the patient.  Potential risks, benefits, alternative treatments, and expected outcomes have been explained.  All of the patient's questions at this time have been answered.  The likelihood of reaching the patient's treatment goal is good.  The patient understands the proposed surgical procedure and wishes to proceed. We will first obtain cardiac clearance.  Patient will need to hold his Eliquis  for 48 hours prior to surgery.     Skyy Nilan DEWAYNE LIMA, MD  05/19/2024 10:32 AM

## 2024-05-19 NOTE — Telephone Encounter (Signed)
   Pre-operative Risk Assessment    Patient Name: Christopher Navarro  DOB: 1951/05/11 MRN: [id number]   Date of last office visit: 04/25/2024 Date of next office visit: 07/03/2024   Request for Surgical Clearance    Procedure:  EXCISION OF SEBACEOUS CYST  Date of Surgery:  Clearance TBD                                Surgeon:  DONNICE BELINDA COME Surgeon's Group or Practice Name:  [facility] Phone number:  [phone] Fax number:  [phone], KELLY DOCKERY,LPN   Type of Clearance Requested:   - Medical  - Pharmacy:  Hold Apixaban  (Eliquis )  OFFICE WOULD LIKE TO KNOW HOW THE PATIENT SHOULD HOLD MEDICATION PREOPERATIVELY.   Type of Anesthesia:  LMA   Additional requests/questions:    Signed, Apolinar Essex   05/19/2024, 11:52 AM

## 2024-05-23 NOTE — Telephone Encounter (Signed)
 Tried contacting patient to schedule TELEVISIT no answer left a detailed vm to call back and schedule

## 2024-05-23 NOTE — Telephone Encounter (Signed)
 Patient with diagnosis of afib on Eliquis  for anticoagulation.    Procedure: EXCISION OF SEBACEOUS CYST  Date of procedure: TBD   CHA2DS2-VASc Score = 5   This indicates a 7.2% annual risk of stroke. The patient's score is based upon: CHF History: 1 HTN History: 1 Diabetes History: 1 Stroke History: 0 Vascular Disease History: 0 Age Score: 2 Gender Score: 0      CrCl 102 ml/min Platelet count 261  Patient underwent repeat afib ablation on 03/28/24. He will need to be on uninterrupted anticoagulation until 06/26/24  Per office protocol, patient can hold Eliquis  for 1 day prior to procedure AFTER 06/26/24  **This guidance is not considered finalized until pre-operative APP has relayed final recommendations.**

## 2024-05-23 NOTE — Telephone Encounter (Signed)
   Name: Christopher Navarro  DOB: 08/19/1950  MRN: [id number]  Primary Cardiologist: [name], MD  Chart reviewed as part of pre-operative protocol coverage. Because of [name] past medical history and time since last visit, he will require a follow-up telephone visit in order to better assess preoperative cardiovascular risk.  Pre-op covering staff: - Please schedule appointment and call patient to inform them. If patient already had an upcoming appointment within acceptable timeframe, please add pre-op clearance to the appointment notes so provider is aware. - Please contact requesting surgeon's office via preferred method (i.e, phone, fax) to inform them of need for appointment prior to surgery.   Patient underwent repeat afib ablation on 03/28/24. He will need to be on uninterrupted anticoagulation until 06/26/24   Per office protocol, patient can hold Eliquis  for 1 day prior to procedure AFTER 06/26/24  Orren LOISE Fabry, PA-C  05/23/2024, 4:01 PM

## 2024-05-24 ENCOUNTER — Telehealth: Payer: Self-pay

## 2024-05-24 NOTE — Telephone Encounter (Signed)
  Patient Consent for Virtual Visit        Christopher Navarro has provided verbal consent on 05/24/2024 for a virtual visit (video or telephone).   CONSENT FOR VIRTUAL VISIT FOR:  Christopher Navarro  By participating in this virtual visit I agree to the following:  I hereby voluntarily request, consent and authorize [facility] and its employed or contracted physicians, physician assistants, nurse practitioners or other licensed health care professionals (the Practitioner), to provide me with telemedicine health care services (the "Services) as deemed necessary by the treating Practitioner. I acknowledge and consent to receive the Services by the Practitioner via telemedicine. I understand that the telemedicine visit will involve communicating with the Practitioner through live audiovisual communication technology and the disclosure of certain medical information by electronic transmission. I acknowledge that I have been given the opportunity to request an in-person assessment or other available alternative prior to the telemedicine visit and am voluntarily participating in the telemedicine visit.  I understand that I have the right to withhold or withdraw my consent to the use of telemedicine in the course of my care at any time, without affecting my right to future care or treatment, and that the Practitioner or I may terminate the telemedicine visit at any time. I understand that I have the right to inspect all information obtained and/or recorded in the course of the telemedicine visit and may receive copies of available information for a reasonable fee.  I understand that some of the potential risks of receiving the Services via telemedicine include:  Delay or interruption in medical evaluation due to technological equipment failure or disruption; Information transmitted may not be sufficient (e.g. poor resolution of images) to allow for appropriate medical decision making by the Practitioner;  and/or  In rare instances, security protocols could fail, causing a breach of personal health information.  Furthermore, I acknowledge that it is my responsibility to provide information about my medical history, conditions and care that is complete and accurate to the best of my ability. I acknowledge that Practitioner's advice, recommendations, and/or decision may be based on factors not within their control, such as incomplete or inaccurate data provided by me or distortions of diagnostic images or specimens that may result from electronic transmissions. I understand that the practice of medicine is not an exact science and that Practitioner makes no warranties or guarantees regarding treatment outcomes. I acknowledge that a copy of this consent can be made available to me via my patient portal [facility] MyChart), or I can request a printed copy by calling the office of [facility].    I understand that my insurance will be billed for this visit.   I have read or had this consent read to me. I understand the contents of this consent, which adequately explains the benefits and risks of the Services being provided via telemedicine.  I have been provided ample opportunity to ask questions regarding this consent and the Services and have had my questions answered to my satisfaction. I give my informed consent for the services to be provided through the use of telemedicine in my medical care

## 2024-05-24 NOTE — Telephone Encounter (Signed)
 Pt returning call from yesterday regarding Tele visit. Please advise

## 2024-05-24 NOTE — Telephone Encounter (Signed)
 Preop tele appt now scheduled, med rec and consent done.

## 2024-05-31 ENCOUNTER — Ambulatory Visit: Attending: Cardiology | Admitting: Nurse Practitioner

## 2024-05-31 DIAGNOSIS — Z01810 Encounter for preprocedural cardiovascular examination: Secondary | ICD-10-CM

## 2024-05-31 NOTE — Progress Notes (Signed)
 Virtual Visit via Telephone Note   Because of CHAU SAWIN co-morbid illnesses, he is at least at moderate risk for complications without adequate follow up.  This format is felt to be most appropriate for this patient at this time.  Due to technical limitations with video connection (technology), today's appointment will be conducted as an audio only telehealth visit, and Christopher Navarro verbally agreed to proceed in this manner.   All issues noted in this document were discussed and addressed.  No physical exam could be performed with this format.  Evaluation Performed:  Preoperative cardiovascular risk assessment _____________   Date:  05/31/2024   Patient ID:  Christopher Navarro, DOB 02/07/1951, MRN [id number] Patient Location:  Home Provider location:   Office  Primary Care Provider:  [name] ORN, MD Primary Cardiologist:  [name], MD  Chief Complaint / Patient Profile   73 y.o. y/o male with a h/o nonobstructive CAD, chronic HFpEF, NICM, WPW s/p ablation in 1997, paroxysmal atrial fibrillation s/p ablation in 03/2020 and 03/2024 on Tikosyn , hypertension, hyperlipidemia, and type 2 diabetes who is pending excision of sebaceous cyst with Dr. Donnice Lima of [facility] and presents today for telephonic preoperative cardiovascular risk assessment.  History of Present Illness    Christopher Navarro is a 73 y.o. male who presents via audio/video conferencing for a telehealth visit today.  Pt was last seen in cardiology clinic on 04/25/2024 by Quita Kicks, PA.  At that time Christopher Navarro was doing well.  The patient is now pending procedure as outlined above. Since his last visit, he has done well from a cardiac standpoint.  He exercises at the gym most days of the week without difficulty.  He denies chest pain, palpitations, dyspnea, pnd, orthopnea, n, v, dizziness, syncope, edema, weight gain, or early satiety. All other systems reviewed and are otherwise negative except as  noted above.   Past Medical History    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   Diabetes mellitus type II    Dyslipidemia    FASCIITIS, PLANTAR 06/20/2009   HYPERTENSION 06/20/2009   Past Surgical History:  Procedure Laterality Date   ablasion  1997   ATRIAL FIBRILLATION ABLATION N/A 03/26/2020   Procedure: ATRIAL FIBRILLATION ABLATION;  Surgeon: [name] Agent, MD;  Location: [facility] INVASIVE CV LAB;  Service: Cardiovascular;  Laterality: N/A;   ATRIAL FIBRILLATION ABLATION N/A 03/28/2024   Procedure: ATRIAL FIBRILLATION ABLATION;  Surgeon: Cindie Ole DASEN, MD;  Location: [facility] INVASIVE CV LAB;  Service: Cardiovascular;  Laterality: N/A;   BACK SURGERY  2006   CARDIOVERSION N/A 08/15/2018   Procedure: CARDIOVERSION;  Surgeon: Okey Vina GAILS, MD;  Location: [facility] ENDOSCOPY;  Service: Cardiovascular;  Laterality: N/A;   CARDIOVERSION N/A 10/20/2018   Procedure: CARDIOVERSION;  Surgeon: Lonni Slain, MD;  Location: [facility] ENDOSCOPY;  Service: Cardiovascular;  Laterality: N/A;   CARPAL TUNNEL RELEASE  2004   Bilateral   KNEE ARTHROSCOPY  2002   RIGHT/LEFT HEART CATH AND CORONARY ANGIOGRAPHY N/A 09/15/2018   Procedure: RIGHT/LEFT HEART CATH AND CORONARY ANGIOGRAPHY;  Surgeon: Swaziland, Peter M, MD;  Location: [facility] INVASIVE CV LAB;  Service: Cardiovascular;  Laterality: N/A;   TRANSESOPHAGEAL ECHOCARDIOGRAM (CATH LAB) N/A 03/14/2024   Procedure: TRANSESOPHAGEAL ECHOCARDIOGRAM;  Surgeon: Sheena Pugh, DO;  Location: [facility] INVASIVE CV LAB;  Service: Cardiovascular;  Laterality: N/A;    Allergies  Allergies  Allergen Reactions  Ciprofloxacin Hives, Itching and Rash    Home Medications    Prior to Admission medications   Medication Sig Start Date End Date Taking? Authorizing Provider  acetaminophen  (TYLENOL ) 500 MG tablet Take 1,000 mg by mouth every 6 (six) hours as needed for mild pain or moderate pain. Patient taking differently:  Take 1,000 mg by mouth as needed for mild pain (pain score 1-3) or moderate pain (pain score 4-6).    [provider]  carvedilol  (COREG ) 12.5 MG tablet TAKE 1 TABLET BY MOUTH TWICE A DAY 04/13/23   Swaziland, Peter M, MD  dofetilide  (TIKOSYN ) 500 MCG capsule TAKE 1 CAPSULE BY MOUTH TWICE A DAY 02/25/24   Cindie Ole DASEN, MD  ELIQUIS  5 MG TABS tablet TAKE 1 TABLET BY MOUTH TWICE A DAY 05/08/24   Swaziland, Peter M, MD  ENTRESTO  24-26 MG TAKE 1 TABLET BY MOUTH TWICE A DAY 10/11/23   Swaziland, Peter M, MD  JARDIANCE  10 MG TABS tablet TAKE 1 TABLET BY MOUTH EVERY DAY 09/10/23   Swaziland, Peter M, MD  Magnesium  250 MG TABS Take 250 mg by mouth 3 (three) times a week.    [provider]  metFORMIN  (GLUCOPHAGE -XR) 750 MG 24 hr tablet Take 1 tablet (750 mg total) by mouth daily with breakfast. 05/09/24   Burchette, Wolm ORN, MD  Multiple Vitamin (MULTIVITAMIN WITH MINERALS) TABS tablet Take 1 tablet by mouth daily. Senior    [provider]  pantoprazole  (PROTONIX ) 40 MG tablet Take 1 tablet (40 mg total) by mouth daily. 01/07/24   Cindie Ole DASEN, MD  rosuvastatin  (CRESTOR ) 20 MG tablet Take 1 tablet (20 mg total) by mouth daily. 05/09/24   [name] ORN, MD    Physical Exam    Vital Signs:  Christopher Navarro does not have vital signs available for review today.  Given telephonic nature of communication, physical exam is limited. AAOx3. NAD. Normal affect.  Speech and respirations are unlabored.  Accessory Clinical Findings    None  Assessment & Plan    1.  Preoperative Cardiovascular Risk Assessment:  According to the Revised Cardiac Risk Index (RCRI), his Perioperative Risk of Major Cardiac Event is (%): 0.9. His Functional Capacity in METs is: 7.01 according to the Duke Activity Status Index (DASI).Therefore, based on ACC/AHA guidelines, patient would be at acceptable risk for the planned procedure without further cardiovascular testing.  The patient was advised that if he  develops new symptoms prior to surgery to contact our office to arrange for a follow-up visit, and he verbalized understanding.   Patient underwent repeat afib ablation on 03/28/24. He will need to be on uninterrupted anticoagulation until 06/26/24.   Per office protocol, patient can hold Eliquis  for 1 day prior to procedure AFTER 06/26/24. Please resume Eliquis  as soon as possible postprocedure, at the discretion of the surgeon.    A copy of this note will be routed to requesting surgeon.  Time:   Today, I have spent 5 minutes with the patient with telehealth technology discussing medical history, symptoms, and management plan.     Damien JAYSON Braver, NP  05/31/2024, 10:27 AM

## 2024-06-12 ENCOUNTER — Encounter: Payer: Self-pay | Admitting: Radiology

## 2024-06-13 ENCOUNTER — Other Ambulatory Visit: Payer: Self-pay

## 2024-06-13 DIAGNOSIS — E119 Type 2 diabetes mellitus without complications: Secondary | ICD-10-CM

## 2024-06-13 NOTE — Progress Notes (Signed)
   06/13/2024  Patient ID: Christopher Navarro, male   DOB: 1951-05-19, 73 y.o.   MRN: [id number]  Pharmacy Quality Measure Review  This patient is appearing on a report for being at risk of failing the Glycemic Status Assessment in Diabetes measure this calendar year.   Last documented A1c 7.3  on 05/09/24  [name], PharmD Clinical Pharmacist [phone]

## 2024-06-22 ENCOUNTER — Other Ambulatory Visit: Payer: Self-pay

## 2024-06-22 ENCOUNTER — Encounter (HOSPITAL_BASED_OUTPATIENT_CLINIC_OR_DEPARTMENT_OTHER): Payer: Self-pay | Admitting: Surgery

## 2024-06-26 ENCOUNTER — Encounter (HOSPITAL_BASED_OUTPATIENT_CLINIC_OR_DEPARTMENT_OTHER)
Admission: RE | Admit: 2024-06-26 | Discharge: 2024-06-26 | Disposition: A | Discharge status: Home / Self Care | Source: Ambulatory Visit | Attending: Surgery | Admitting: Surgery

## 2024-06-26 DIAGNOSIS — D6869 Other thrombophilia: Secondary | ICD-10-CM | POA: Diagnosis not present

## 2024-06-26 DIAGNOSIS — Z7984 Long term (current) use of oral hypoglycemic drugs: Secondary | ICD-10-CM | POA: Diagnosis not present

## 2024-06-26 DIAGNOSIS — I5022 Chronic systolic (congestive) heart failure: Secondary | ICD-10-CM | POA: Diagnosis not present

## 2024-06-26 DIAGNOSIS — I110 Hypertensive heart disease with heart failure: Secondary | ICD-10-CM | POA: Diagnosis not present

## 2024-06-26 DIAGNOSIS — L723 Sebaceous cyst: Secondary | ICD-10-CM | POA: Diagnosis present

## 2024-06-26 DIAGNOSIS — Z7901 Long term (current) use of anticoagulants: Secondary | ICD-10-CM | POA: Diagnosis not present

## 2024-06-26 DIAGNOSIS — L720 Epidermal cyst: Secondary | ICD-10-CM | POA: Diagnosis not present

## 2024-06-26 DIAGNOSIS — E119 Type 2 diabetes mellitus without complications: Secondary | ICD-10-CM | POA: Diagnosis not present

## 2024-06-26 DIAGNOSIS — I4891 Unspecified atrial fibrillation: Secondary | ICD-10-CM | POA: Diagnosis not present

## 2024-06-26 DIAGNOSIS — K219 Gastro-esophageal reflux disease without esophagitis: Secondary | ICD-10-CM | POA: Diagnosis not present

## 2024-06-26 LAB — BASIC METABOLIC PANEL WITH GFR
Anion gap: 10 (ref 5–15)
BUN: 13 mg/dL (ref 8–23)
CO2: 26 mmol/L (ref 22–32)
Calcium: 8.9 mg/dL (ref 8.9–10.3)
Chloride: 102 mmol/L (ref 98–111)
Creatinine, Ser: 0.84 mg/dL (ref 0.61–1.24)
GFR, Estimated: >60 mL/min (ref >60)
Glucose, Bld: 133 mg/dL — ABNORMAL HIGH (ref 70–99)
Potassium: 5 mmol/L (ref 3.5–5.1)
Sodium: 138 mmol/L (ref 135–145)

## 2024-06-26 MED ORDER — CHLORHEXIDINE GLUCONATE CLOTH 2 % EX PADS: 6.0000 | MEDICATED_PAD | Freq: Once | CUTANEOUS | Status: DC

## 2024-06-26 NOTE — Progress Notes (Signed)

## 2024-06-27 ENCOUNTER — Ambulatory Visit: Admitting: Cardiology

## 2024-06-27 NOTE — Anesthesia Preprocedure Evaluation (Signed)
 Anesthesia Evaluation  Patient identified by MRN, date of birth, ID band Patient awake    Reviewed: Allergy & Precautions, NPO status , Patient's Chart, lab work & pertinent test results  History of Anesthesia Complications Negative for: history of anesthetic complications  Airway Mallampati: II  TM Distance: >3 FB Neck ROM: Full    Dental  (+) Missing,    Pulmonary former smoker   Pulmonary exam normal        Cardiovascular hypertension, Pt. on medications and Pt. on home beta blockers Normal cardiovascular exam+ dysrhythmias Atrial Fibrillation      Neuro/Psych negative neurological ROS     GI/Hepatic Neg liver ROS,GERD  Medicated,,  Endo/Other  diabetes, Type 2, Oral Hypoglycemic Agents    Renal/GU negative Renal ROS     Musculoskeletal  (+) Arthritis ,    Abdominal   Peds  Hematology negative hematology ROS (+)   Anesthesia Other Findings SEBACEOUS CYST ANTERIOR CHEST WALL  Reproductive/Obstetrics                              Anesthesia Physical Anesthesia Plan  ASA: 2  Anesthesia Plan: General   Post-op Pain Management: Tylenol  PO (pre-op)*   Induction: Intravenous  PONV Risk Score and Plan: 2 and Ondansetron , Dexamethasone  and Treatment may vary due to age or medical condition  Airway Management Planned: LMA  Additional Equipment: None  Intra-op Plan:   Post-operative Plan: Extubation in OR  Informed Consent: I have reviewed the patients History and Physical, chart, labs and discussed the procedure including the risks, benefits and alternatives for the proposed anesthesia with the patient or authorized representative who has indicated his/her understanding and acceptance.     Dental advisory given  Plan Discussed with: CRNA  Anesthesia Plan Comments:          Anesthesia Quick Evaluation

## 2024-06-28 ENCOUNTER — Ambulatory Visit (HOSPITAL_BASED_OUTPATIENT_CLINIC_OR_DEPARTMENT_OTHER)
Admission: RE | Admit: 2024-06-28 | Discharge: 2024-06-28 | Disposition: A | Discharge status: Home / Self Care | Attending: Surgery | Admitting: Surgery

## 2024-06-28 ENCOUNTER — Encounter (HOSPITAL_BASED_OUTPATIENT_CLINIC_OR_DEPARTMENT_OTHER)
Admission: RE | Disposition: A | Discharge status: Home / Self Care | Payer: Self-pay | Source: Home / Self Care | Attending: Surgery

## 2024-06-28 ENCOUNTER — Other Ambulatory Visit: Payer: Self-pay

## 2024-06-28 ENCOUNTER — Ambulatory Visit (HOSPITAL_BASED_OUTPATIENT_CLINIC_OR_DEPARTMENT_OTHER): Admitting: Anesthesiology

## 2024-06-28 ENCOUNTER — Encounter (HOSPITAL_BASED_OUTPATIENT_CLINIC_OR_DEPARTMENT_OTHER): Payer: Self-pay | Admitting: Surgery

## 2024-06-28 DIAGNOSIS — I110 Hypertensive heart disease with heart failure: Secondary | ICD-10-CM | POA: Insufficient documentation

## 2024-06-28 DIAGNOSIS — L723 Sebaceous cyst: Secondary | ICD-10-CM | POA: Diagnosis not present

## 2024-06-28 DIAGNOSIS — Z7901 Long term (current) use of anticoagulants: Secondary | ICD-10-CM | POA: Insufficient documentation

## 2024-06-28 DIAGNOSIS — K219 Gastro-esophageal reflux disease without esophagitis: Secondary | ICD-10-CM | POA: Insufficient documentation

## 2024-06-28 DIAGNOSIS — D6869 Other thrombophilia: Secondary | ICD-10-CM | POA: Insufficient documentation

## 2024-06-28 DIAGNOSIS — Z7984 Long term (current) use of oral hypoglycemic drugs: Secondary | ICD-10-CM | POA: Insufficient documentation

## 2024-06-28 DIAGNOSIS — E11628 Type 2 diabetes mellitus with other skin complications: Secondary | ICD-10-CM

## 2024-06-28 DIAGNOSIS — L720 Epidermal cyst: Secondary | ICD-10-CM | POA: Insufficient documentation

## 2024-06-28 DIAGNOSIS — I4891 Unspecified atrial fibrillation: Secondary | ICD-10-CM | POA: Insufficient documentation

## 2024-06-28 DIAGNOSIS — E119 Type 2 diabetes mellitus without complications: Secondary | ICD-10-CM | POA: Insufficient documentation

## 2024-06-28 DIAGNOSIS — I5022 Chronic systolic (congestive) heart failure: Secondary | ICD-10-CM | POA: Insufficient documentation

## 2024-06-28 HISTORY — PX: MASS EXCISION: SHX2000

## 2024-06-28 LAB — GLUCOSE, CAPILLARY
Glucose-Capillary: 143 mg/dL — ABNORMAL HIGH (ref 70–99)
Glucose-Capillary: 130 mg/dL — ABNORMAL HIGH (ref 70–99)

## 2024-06-28 SURGERY — EXCISION, MASS, CHEST WALL
Anesthesia: General | Site: Chest

## 2024-06-28 MED ORDER — 0.9 % SODIUM CHLORIDE (POUR BTL) OPTIME
TOPICAL | Status: DC | PRN
  Administered 2024-06-28: 100 mL

## 2024-06-28 MED ORDER — CEFAZOLIN SODIUM-DEXTROSE 2-4 GM/100ML-% IV SOLN
INTRAVENOUS | Status: AC
  Filled 2024-06-28: qty 100

## 2024-06-28 MED ORDER — DEXAMETHASONE SOD PHOSPHATE PF 10 MG/ML IJ SOLN
INTRAMUSCULAR | Status: DC | PRN
  Administered 2024-06-28: 5 mg via INTRAVENOUS

## 2024-06-28 MED ORDER — EPHEDRINE SULFATE (PRESSORS) 25 MG/5ML IV SOSY
PREFILLED_SYRINGE | INTRAVENOUS | Status: DC | PRN
  Administered 2024-06-28 (×2): 5 mg via INTRAVENOUS

## 2024-06-28 MED ORDER — LIDOCAINE 2% (20 MG/ML) 5 ML SYRINGE
INTRAMUSCULAR | Status: AC
  Filled 2024-06-28: qty 5

## 2024-06-28 MED ORDER — FENTANYL CITRATE (PF) 100 MCG/2ML IJ SOLN
INTRAMUSCULAR | Status: AC
  Filled 2024-06-28: qty 2

## 2024-06-28 MED ORDER — ONDANSETRON HCL 4 MG/2ML IJ SOLN
INTRAMUSCULAR | Status: AC
Start: 2024-06-28 — End: 2024-06-28
  Filled 2024-06-28: qty 2

## 2024-06-28 MED ORDER — ACETAMINOPHEN 500 MG PO TABS: 1000.0000 mg | ORAL_TABLET | ORAL | Status: AC

## 2024-06-28 MED ORDER — LACTATED RINGERS IV SOLN: INTRAVENOUS | Status: DC

## 2024-06-28 MED ORDER — PROPOFOL 500 MG/50ML IV EMUL
INTRAVENOUS | Status: AC
  Filled 2024-06-28: qty 100

## 2024-06-28 MED ORDER — ACETAMINOPHEN 500 MG PO TABS
ORAL_TABLET | ORAL | Status: AC
  Filled 2024-06-28: qty 2

## 2024-06-28 MED ORDER — LIDOCAINE 2% (20 MG/ML) 5 ML SYRINGE
INTRAMUSCULAR | Status: DC | PRN
  Administered 2024-06-28: 100 mg via INTRAVENOUS

## 2024-06-28 MED ORDER — FENTANYL CITRATE (PF) 100 MCG/2ML IJ SOLN
INTRAMUSCULAR | Status: DC | PRN
  Administered 2024-06-28: 50 ug via INTRAVENOUS
  Administered 2024-06-28: 25 ug via INTRAVENOUS

## 2024-06-28 MED ORDER — ONDANSETRON HCL 4 MG/2ML IJ SOLN
INTRAMUSCULAR | Status: DC | PRN
  Administered 2024-06-28: 4 mg via INTRAVENOUS

## 2024-06-28 MED ORDER — FENTANYL CITRATE (PF) 100 MCG/2ML IJ SOLN: 25.0000 ug | INTRAMUSCULAR | Status: DC | PRN

## 2024-06-28 MED ORDER — BUPIVACAINE-EPINEPHRINE 0.25% -1:200000 IJ SOLN
INTRAMUSCULAR | Status: DC | PRN
  Administered 2024-06-28: 20 mL

## 2024-06-28 MED ORDER — KETOROLAC TROMETHAMINE 30 MG/ML IJ SOLN
INTRAMUSCULAR | Status: AC
Start: 2024-06-28 — End: 2024-06-28
  Filled 2024-06-28: qty 1

## 2024-06-28 MED ORDER — OXYCODONE HCL 5 MG PO TABS: 5.0000 mg | ORAL_TABLET | Freq: Once | ORAL | Status: DC | PRN

## 2024-06-28 MED ORDER — BUPIVACAINE-EPINEPHRINE (PF) 0.25% -1:200000 IJ SOLN
INTRAMUSCULAR | Status: AC
  Filled 2024-06-28: qty 30

## 2024-06-28 MED ORDER — DROPERIDOL 2.5 MG/ML IJ SOLN: 0.6250 mg | Freq: Once | INTRAMUSCULAR | Status: DC | PRN

## 2024-06-28 MED ORDER — PROPOFOL 10 MG/ML IV BOLUS
INTRAVENOUS | Status: DC | PRN
Start: 2024-06-28 — End: 2024-06-28
  Administered 2024-06-28: 170 mg via INTRAVENOUS
  Administered 2024-06-28: 30 mg via INTRAVENOUS

## 2024-06-28 MED ORDER — ACETAMINOPHEN 500 MG PO TABS
1000.0000 mg | ORAL_TABLET | Freq: Once | ORAL | Status: AC
  Administered 2024-06-28: 1000 mg via ORAL

## 2024-06-28 MED ORDER — CEFAZOLIN SODIUM-DEXTROSE 2-4 GM/100ML-% IV SOLN: 2.0000 g | INTRAVENOUS | Status: DC

## 2024-06-28 MED ORDER — PHENYLEPHRINE 80 MCG/ML (10ML) SYRINGE FOR IV PUSH (FOR BLOOD PRESSURE SUPPORT)
PREFILLED_SYRINGE | INTRAVENOUS | Status: DC | PRN
  Administered 2024-06-28 (×4): 80 ug via INTRAVENOUS
  Administered 2024-06-28: 160 ug via INTRAVENOUS
  Administered 2024-06-28: 80 ug via INTRAVENOUS

## 2024-06-28 MED ORDER — ROCURONIUM BROMIDE 10 MG/ML (PF) SYRINGE
PREFILLED_SYRINGE | INTRAVENOUS | Status: AC
  Filled 2024-06-28: qty 10

## 2024-06-28 MED ORDER — OXYCODONE HCL 5 MG/5ML PO SOLN: 5.0000 mg | Freq: Once | ORAL | Status: DC | PRN

## 2024-06-28 SURGICAL SUPPLY — 32 items
BENZOIN TINCTURE PRP APPL 2/3 (GAUZE/BANDAGES/DRESSINGS) ×1 IMPLANT
BLADE SURG 15 STRL LF DISP TIS (BLADE) ×1 IMPLANT
CANISTER SUCT 1200ML W/VALVE (MISCELLANEOUS) IMPLANT
CHLORAPREP W/TINT 26 (MISCELLANEOUS) ×1 IMPLANT
COVER BACK TABLE 60X90IN (DRAPES) ×1 IMPLANT
COVER MAYO STAND STRL (DRAPES) ×1 IMPLANT
DRAPE LAPAROTOMY 100X72 PEDS (DRAPES) ×1 IMPLANT
DRAPE UTILITY XL STRL (DRAPES) ×1 IMPLANT
DRSG TEGADERM 2-3/8X2-3/4 SM (GAUZE/BANDAGES/DRESSINGS) IMPLANT
DRSG TEGADERM 4X4.75 (GAUZE/BANDAGES/DRESSINGS) IMPLANT
ELECT COATED BLADE 2.86 ST (ELECTRODE) ×1 IMPLANT
ELECTRODE REM PT RTRN 9FT ADLT (ELECTROSURGICAL) ×1 IMPLANT
GAUZE SPONGE 2X2 STRL 8-PLY (GAUZE/BANDAGES/DRESSINGS) IMPLANT
GAUZE SPONGE 4X4 12PLY STRL LF (GAUZE/BANDAGES/DRESSINGS) IMPLANT
GLOVE BIO SURGEON STRL SZ7 (GLOVE) ×1 IMPLANT
GLOVE BIOGEL PI IND STRL 7.5 (GLOVE) ×1 IMPLANT
GOWN STRL REUS W/ TWL LRG LVL3 (GOWN DISPOSABLE) ×2 IMPLANT
NDL HYPO 25X1 1.5 SAFETY (NEEDLE) ×1 IMPLANT
NEEDLE HYPO 25X1 1.5 SAFETY (NEEDLE) ×1 IMPLANT
PACK BASIN DAY SURGERY FS (CUSTOM PROCEDURE TRAY) ×1 IMPLANT
PENCIL SMOKE EVACUATOR (MISCELLANEOUS) ×1 IMPLANT
SLEEVE SCD COMPRESS KNEE MED (STOCKING) ×1 IMPLANT
SOLN 0.9% NACL POUR BTL 1000ML (IV SOLUTION) ×1 IMPLANT
SPIKE FLUID TRANSFER (MISCELLANEOUS) ×1 IMPLANT
SPONGE T-LAP 4X18 ~~LOC~~+RFID (SPONGE) ×1 IMPLANT
STRIP CLOSURE SKIN 1/2X4 (GAUZE/BANDAGES/DRESSINGS) ×1 IMPLANT
SUT MNCRL AB 4-0 PS2 18 (SUTURE) ×1 IMPLANT
SUT VIC AB 3-0 SH 27X BRD (SUTURE) ×1 IMPLANT
SYR CONTROL 10ML LL (SYRINGE) ×1 IMPLANT
TOWEL GREEN STERILE FF (TOWEL DISPOSABLE) ×1 IMPLANT
TUBE CONNECTING 20X1/4 (TUBING) IMPLANT
YANKAUER SUCT BULB TIP NO VENT (SUCTIONS) IMPLANT

## 2024-06-28 NOTE — Transfer of Care (Signed)
 Immediate Anesthesia Transfer of Care Note  Patient: Christopher Navarro  Procedure(s) Performed: EXCISION, MASS, CHEST WALL (Chest)  Patient Location: PACU  Anesthesia Type:General  Level of Consciousness: sedated  Airway & Oxygen Therapy: Patient connected to nasal cannula oxygen  Post-op Assessment: Report given to RN and Post -op Vital signs reviewed and stable  Post vital signs: Reviewed and stable  Last Vitals:  Vitals Value Taken Time  BP 147/81 06/28/24 09:53  Temp    Pulse 65 06/28/24 09:56  Resp 12 06/28/24 09:56  SpO2 98 % 06/28/24 09:56  Vitals shown include unfiled device data.  Last Pain:  Vitals:   06/28/24 0751  TempSrc: Temporal  PainSc: 0-No pain      Patients Stated Pain Goal: 1 (06/28/24 0751)  Complications: No notable events documented.

## 2024-06-28 NOTE — Discharge Instructions (Addendum)
 [facility] Office Phone Number [phone]  Cyst Excision: POST OP INSTRUCTIONS  Always review your discharge instruction sheet given to you by the facility where your surgery was performed.  IF YOU HAVE DISABILITY OR FAMILY LEAVE FORMS, YOU MUST BRING THEM TO THE OFFICE FOR PROCESSING.  DO NOT GIVE THEM TO YOUR DOCTOR.   You may take acetaminophen  (Tylenol ) or ibuprofen (Advil) as needed.Take your usually prescribed medications unless otherwise directed You should eat very light the first 24 hours after surgery, such as soup, crackers, pudding, etc.  Resume your normal diet the day after surgery. Most patients will experience some swelling and bruising around the surgical site.  Ice packs will help.  Swelling and bruising can take several days to resolve.  It is common to experience some constipation if taking pain medication after surgery.  Increasing fluid intake and taking a stool softener will usually help or prevent this problem from occurring.  A mild laxative (Milk of Magnesia or Miralax) should be taken according to package directions if there are no bowel movements after 48 hours. You may remove your bandages 48 hours after surgery, and you may shower at that time.  You will have steri-strips (small skin tapes) in place directly over the incision.  These strips should be left on the skin for 7-10 days.   ACTIVITIES:  You may resume regular daily activities (gradually increasing) beginning the next day.   You may have sexual intercourse when it is comfortable. You may drive when you no longer are taking prescription pain medication, you can comfortably wear a seatbelt, and you can safely maneuver your car and apply brakes. RETURN TO WORK:  1-2 weeks You should see your doctor in the office for a follow-up appointment approximately two to three weeks after your surgery.    WHEN TO CALL YOUR DOCTOR: Fever over 101.0 Nausea and/or vomiting. Extreme swelling or  bruising. Continued bleeding from incision. Increased pain, redness, or drainage from the incision.  The clinic staff is available to answer your questions during regular business hours.  Please don't hesitate to call and ask to speak to one of the nurses for clinical concerns.  If you have a medical emergency, go to the nearest emergency room or call 911.  A surgeon from [facility] is always on call at the hospital.  For further questions, please visit [email or web address]     Post Anesthesia Home Care Instructions  Activity: Get plenty of rest for the remainder of the day. A responsible individual must stay with you for 24 hours following the procedure.  For the next 24 hours, DO NOT: -Drive a car -Advertising copywriter -Drink alcoholic beverages -Take any medication unless instructed by your physician -Make any legal decisions or sign important papers.  Meals: Start with liquid foods such as gelatin or soup. Progress to regular foods as tolerated. Avoid greasy, spicy, heavy foods. If nausea and/or vomiting occur, drink only clear liquids until the nausea and/or vomiting subsides. Call your physician if vomiting continues.  Special Instructions/Symptoms: Your throat may feel dry or sore from the anesthesia or the breathing tube placed in your throat during surgery. If this causes discomfort, gargle with warm salt water. The discomfort should disappear within 24 hours.  If you had a scopolamine patch placed behind your ear for the management of post- operative nausea and/or vomiting:  1. The medication in the patch is effective for 72 hours, after which it should be removed.  Wrap patch in a  tissue and discard in the trash. Wash hands thoroughly with soap and water. 2. You may remove the patch earlier than 72 hours if you experience unpleasant side effects which may include dry mouth, dizziness or visual disturbances. 3. Avoid touching the patch. Wash your hands with  soap and water after contact with the patch.  No tylenol  until after 1:43pm today

## 2024-06-28 NOTE — H&P (Signed)
 Chief Complaint: Inguinal Hernia       History of Present Illness: Christopher Navarro is a 73 y.o. male who is seen today as an office consultation at the request of Dr. Micheal for evaluation of Inguinal Hernia .   This is a 73 year old male with multiple medical issues including atrial fibrillation on Eliquis  who presents with several years of an enlarging sebaceous cyst on his anterior chest.  This has never become infected.  However, it continues to enlarge and is causing some discomfort.  Recently he brought this to the attention of Dr. Micheal who recommended that he have a surgical consultation for excision.  His last attempt at ablation for atrial fibrillation was in August of this year.     Review of Systems: A complete review of systems was obtained from the patient.  I have reviewed this information and discussed as appropriate with the patient.  See HPI as well for other ROS.   Review of Systems  Constitutional: Negative.   HENT:  Positive for tinnitus.   Eyes: Negative.   Respiratory: Negative.    Cardiovascular: Negative.   Gastrointestinal: Negative.   Genitourinary: Negative.   Musculoskeletal: Negative.   Skin: Negative.   Neurological: Negative.   Endo/Heme/Allergies: Negative.   Psychiatric/Behavioral: Negative.          Medical History: Past Medical History         Past Medical History:  Diagnosis Date   Arrhythmia     CHF (congestive heart failure) (CMS/HHS-HCC)     Diabetes mellitus without complication (CMS/HHS-HCC)          Problem List       Patient Active Problem List  Diagnosis   Chronic systolic CHF (congestive heart failure) (CMS/HHS-HCC)   Dyslipidemia   Dysphagia   Essential hypertension   Gastroesophageal reflux disease   Obesity (BMI 30-39.9), unspecified   Atrial fibrillation (CMS/HHS-HCC)   Secondary hypercoagulable state (HHS-HCC)   Type 2 diabetes mellitus, controlled (CMS/HHS-HCC)        Past Surgical History            Past Surgical History:  Procedure Laterality Date   ABLATION ARRYTHMIA FOCUS       back surgery       ENDOSCOPIC CARPAL TUNNEL RELEASE            Allergies         Allergies  Allergen Reactions   Ciprofloxacin Hives, Itching and Rash        Medications Ordered Prior to Encounter             Current Outpatient Medications on File Prior to Visit  Medication Sig Dispense Refill   dofetilide  (TIKOSYN ) 500 MCG capsule Take 500 mcg by mouth 2 (two) times daily       ELIQUIS  5 mg tablet Take 5 mg by mouth 2 (two) times daily       ENTRESTO  24-26 mg tablet Take 1 tablet by mouth 2 (two) times daily       JARDIANCE  10 mg tablet Take 10 mg by mouth once daily       metFORMIN  (GLUCOPHAGE -XR) 750 MG XR tablet Take 750 mg by mouth       pantoprazole  (PROTONIX ) 40 MG DR tablet Take 40 mg by mouth once daily       rosuvastatin  (CRESTOR ) 20 MG tablet Take 20 mg by mouth once daily       acetaminophen  (TYLENOL ) 500 MG tablet Take 1,000 mg by mouth  magnesium  250 mg Tab Take 250 mg by mouth       multivitamin with minerals tablet Take 1 tablet by mouth        No current facility-administered medications on file prior to visit.        Family History           Family History  Problem Relation Age of Onset   Stroke Mother     Obesity Mother     High blood pressure (Hypertension) Mother     Diabetes Mother     Coronary Artery Disease (Blocked arteries around heart) Father     High blood pressure (Hypertension) Sister     Skin cancer Brother     Coronary Artery Disease (Blocked arteries around heart) Brother     Diabetes Brother          Tobacco Use History  Social History         Tobacco Use  Smoking Status Never  Smokeless Tobacco Never        Social History  Social History           Socioeconomic History   Marital status: Married  Tobacco Use   Smoking status: Never   Smokeless tobacco: Never  Vaping Use   Vaping status: Never Used  Substance and Sexual  Activity   Drug use: Never    Social Drivers of Barista Strain: Low Risk  (05/09/2024)    Received from [facility]    Overall Financial Resource Strain (CARDIA)     How hard is it for you to pay for the very basics like food, housing, medical care, and heating?: Not hard at all  Food Insecurity: No Food Insecurity (05/09/2024)    Received from [facility]    Hunger Vital Sign     Within the past 12 months, you worried that your food would run out before you got the money to buy more.: Never true     Within the past 12 months, the food you bought just didn't last and you didn't have money to get more.: Never true  Transportation Needs: No Transportation Needs (05/09/2024)    Received from [facility] - Transportation     In the past 12 months, has lack of transportation kept you from medical appointments or from getting medications?: No     In the past 12 months, has lack of transportation kept you from meetings, work, or from getting things needed for daily living?: No  Physical Activity: Sufficiently Active (05/09/2024)    Received from [facility]    Exercise Vital Sign     On average, how many days per week do you engage in moderate to strenuous exercise (like a brisk walk)?: 5 days     On average, how many minutes do you engage in exercise at this level?: 50 min  Stress: No Stress Concern Present (05/09/2024)    Received from [facility] - Occupational Stress Questionnaire     Do you feel stress - tense, restless, nervous, or anxious, or unable to sleep at night because your mind is troubled all the time - these days?: Only a little  Social Connections: Socially Integrated (05/09/2024)    Received from [facility]    Social Connection and Isolation Panel     In a typical week, how many times do you talk  on the phone with family, friends, or neighbors?: More than three times a week     How often do you  get together with friends or relatives?: More than three times a week     How often do you attend church or religious services?: More than 4 times per year     Do you belong to any clubs or organizations such as church groups, unions, fraternal or athletic groups, or school groups?: Yes     How often do you attend meetings of the clubs or organizations you belong to?: More than 4 times per year     Are you married, widowed, divorced, separated, never married, or living with a partner?: Married  Housing Stability: Unknown (05/19/2024)    Housing Stability Vital Sign     Homeless in the Last Year: No        Objective:             Vitals:    05/19/24 1006 05/19/24 1007  BP: 138/73    Pulse: 72    Temp: 36.6 C (97.8 F)    SpO2: 98%    Weight: 99.3 kg (219 lb)    Height: 185.4 cm (6' 1)    PainSc:   0-No pain    Body mass index is 28.89 kg/m.   Physical Exam    Constitutional:  WDWN in NAD, conversant, no obvious deformities; lying in bed comfortably Eyes:  Pupils equal, round; sclera anicteric; moist conjunctiva; no lid lag HENT:  Oral mucosa moist; good dentition  Neck:  No masses palpated, trachea midline; no thyromegaly Lungs:  CTA bilaterally; normal respiratory effort Mid chest just to the right of midline, there is a protruding 7 cm subcutaneous cyst.  There is an obvious punctate opening but there is no drainage.  No sign of infection. CV:  Regular rate and rhythm; no murmurs; extremities well-perfused with no edema Abd:  +bowel sounds, soft, non-tender, no palpable organomegaly; no palpable hernias Musc: Normal gait; no apparent clubbing or cyanosis in extremities Lymphatic:  No palpable cervical or axillary lymphadenopathy Skin:  Warm, dry; no sign of jaundice Psychiatric - alert and oriented x 4; calm mood and affect       Assessment and Plan:  Diagnoses and all orders for this visit:   Sebaceous cyst   Anterior chest wall 7 cm   Recommend excision of  sebaceous cyst of the anterior chest wall under anesthesia.The surgical procedure has been discussed with the patient.  Potential risks, benefits, alternative treatments, and expected outcomes have been explained.  All of the patient's questions at this time have been answered.  The likelihood of reaching the patient's treatment goal is good.  The patient understands the proposed surgical procedure and wishes to proceed. We will first obtain cardiac clearance.  Patient will need to hold his Eliquis  for 48 hours prior to surgery.    Donnice POUR. Belinda, MD, [facility]  General Surgery   06/28/2024 8:36 AM

## 2024-06-28 NOTE — Progress Notes (Signed)
 Cardiology Office Note:  .   Date:  06/28/2024  ID:  Christopher Navarro, DOB [birth year], MRN [id number] PCP: [name] ORN, MD   [facility] Providers Cardiologist:  Peter Jordan, MD Electrophysiologist:  OLE ONEIDA HOLTS, MD {  History of Present Illness: .   Christopher Navarro is a 73 y.o. male w/PMHx of HTN, DM,  Obesity, CAD, CM (felt to be out pf proportion to his CAD, suspect 2/2 AFib/tachycardia), and persistent AFib.   He saw Dr. Holts 01/15/23, doing well, intermittent brief palpitations only, stable Qtc, volume stable No changes made  He saw Dr. Jordan, longest episode of palpitations about an hour, was active, going to the gym, doing yard work. He mentions hx of WPW ablated No changes were made.  I saw him 05/19/23:  Doing well, happy with his rhythm control Stable EKG/QTc No changes made  Saw Dr. Jordan 10/05/23, reported some AFib about once a week once lasted 4 hours feeling tired afterwards, doing well, once   I saw him 10/18/23 He is doing well overall Going to the gym regularly, treadmill, machines, weights with good exertional capacity A bit of an up-tick in AFib burden, no real trigger that he can think of About 1/week, lasts 2-3 hours, they leave him feeling tired Burden not quite to the point he wanted to consider re-do ablation  Saw Suzann 11/24/23 More symptoms > planned for monitoring  Saw Dr. Holts 01/07/24 Planned for a redo ablation Trialed Protonix  for GI symptoms  AFib ablation 03/28/24  Saw the AFib clinic as usual 04/25/24 Doing well, no symptoms of AFib, no procedural concerns No changes were made  Today's visit is scheduled as his post ablation visit ROS:   He is doing very well Remains active, with good exertional capacity No AFib post ablation Reports good medication compliance   Afib hx AF diagnosed 2019 PVI ablation 03/26/2020 Re-do Afib ablation 03/28/24  He has had WPW ablation back in 1997   AAD Hx Tikosyn  started  March 2020 >> is current   Studies Reviewed: SABRA    EKG done today and reviewed by myself:  SB 53bpm, QTc  03/28/24: EPS/ablation CONCLUSIONS: 1. Successful redo PVI (left pulmonary veins) 2. Successful redo ablation/isolation of the posterior wall 3. Intracardiac echo reveals trivial pericardial effusion, normal LA architecture 4. No early apparent complications. 5. Colchicine  0.6mg  PO BID x 5 days 6. Protonix  40mg  PO daily x 45 days   03/14/24: TEE KEY FINDINGS:   Normal left ventricular systolic function, EF 55 to 60%, no left atrial appendage clot seen, mild mitral vegetation, trivial aortic regurgitation.  Full report to follow. Further management per primary team.   10/02/2020: TTE IMPRESSIONS   1. Left ventricular ejection fraction, by estimation, is 55 to 60%. The  left ventricle has normal function. The left ventricle has no regional  wall motion abnormalities.   2. E/A ratio >2.2 which would be consistent with restrictive filling,  however, tissue dopplers normal and no other parameters suggestive of  restrictive process. Suspect impaired relaxation without restriction.   3. Right ventricular systolic function is normal. The right ventricular  size is normal.   4. The mitral valve is normal in structure. Trivial mitral valve  regurgitation.   5. The aortic valve is tricuspid. Aortic valve regurgitation is not  visualized. No aortic stenosis is present.   6. The inferior vena cava is normal in size with greater than 50%  respiratory variability, suggesting right atrial pressure of  3 mmHg.    Cardiac cath 09/25/18:  RIGHT/LEFT HEART CATH AND CORONARY ANGIOGRAPHY  Conclusion      Ost 1st Diag lesion is 99% stenosed. Ost Cx to Dist Cx lesion is 25% stenosed. There is moderate left ventricular systolic dysfunction. The left ventricular ejection fraction is 35-45% by visual estimate. LV end diastolic pressure is mildly elevated. Hemodynamic findings consistent  with mild pulmonary hypertension.   1. Left dominant circulation 2. 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 Global LV dysfunction. EF 40%. 4. Mildly elevated LV filling pressures 5. Mild pulmonary venous HTN 6. Low cardiac output.    Plan: will optimize therapy for CHF. Increase Coreg  dose to 25 mg bid as Afib rate is not well controlled. Consider switching lisinopril  to Entresto . Consider adding Jardiance  for DM. Once CHF medications optimized will plan Tikosyn  load and conversion to NSR. OK to resume Eliquis  in am.     Risk Assessment/Calculations:    Physical Exam:   VS:  There were no vitals taken for this visit.   Wt Readings from Last 3 Encounters:  06/28/24 221 lb 12.5 oz (100.6 kg)  05/09/24 215 lb 6.4 oz (97.7 kg)  04/25/24 220 lb 3.2 oz (99.9 kg)    GEN: Well nourished, well developed in no acute distress NECK: No JVD; No carotid bruits CARDIAC: RRR, no murmurs, rubs, gallops RESPIRATORY: CTA b/l without rales, wheezing or rhonchi  ABDOMEN: Soft, non-tender, non-distended EXTREMITIES: No edema; No deformity    ASSESSMENT AND PLAN: .    paroxysmal AFib CHA2DS2Vasc is 5, on eliquis , appropriately dosed Tikosyn  w/stable QTc No/low burden post ablation Meds reviewed   NICM Recovered LVEF On BB, Entresto , Jardiance  No symptoms or exam findings of volume OL C/w Dr. Elston  HTN Better at home  CAD No anginal symptoms  On BB, statin, no ASA w/OAC C/w Dr. Elston  Secondary hypercoagulable state 2/2 AFib    Dispo: back in 4 mo, sooner if needed  Signed, Charlies Macario Arthur, PA-C

## 2024-06-28 NOTE — Op Note (Signed)
 Pre-op diagnosis: Sebaceous cyst of the chest (5 cm) Postop diagnosis: Same Procedure performed: Excision of sebaceous cyst of the chest (5 cm) - subcutaneous Surgeon:Lawson Mahone K Velvie Thomaston Anesthesia: General Indications:This is a 73 year old male with multiple medical issues including atrial fibrillation on Eliquis  who presents with several years of an enlarging sebaceous cyst on his anterior chest. This has never become infected. However, it continues to enlarge and is causing some discomfort. Recently he brought this to the attention of Dr. Micheal who recommended that he have a surgical consultation for excision.  Description of procedure: The patient is brought to the operating room placed in supine position on the operating room table.  After an adequate level general anesthesia was obtained, his anterior chest was prepped with ChloraPrep and draped sterile fashion.  A timeout was taken to ensure the proper patient and proper procedure.  The patient has an obvious punctate opening of the cyst.  There is no drainage at this time.  No sign of infection.  We made an elliptical incision across the anterior part of the cyst to include the skin opening.  I dissected down sharply to the surface of the cyst.  We then bluntly dissected completely around the cyst and delivered it entirely intact.  This was sent for pathology.  We irrigated the wound thoroughly and inspected for hemostasis.  I infiltrated local anesthetic.  We closed the wound with a deep layer of 3-0 Vicryl and a subcuticular layer 4-0 Monocryl.  Benzoin and Steri-Strips were applied.  A sterile dressing is applied.  The patient was then extubated and brought to recovery room in stable condition.  All sponge, instrument, and needle counts are correct.  Donnice POUR. Belinda, MD, [facility] Surgery  General Surgery   06/28/2024 9:53 AM

## 2024-06-28 NOTE — Anesthesia Postprocedure Evaluation (Signed)
 Anesthesia Post Note  Patient: Christopher Navarro  Procedure(s) Performed: EXCISION, MASS, CHEST WALL (Chest)     Patient location during evaluation: PACU Anesthesia Type: General Level of consciousness: awake and alert Pain management: pain level controlled Vital Signs Assessment: post-procedure vital signs reviewed and stable Respiratory status: spontaneous breathing, nonlabored ventilation and respiratory function stable Cardiovascular status: blood pressure returned to baseline Postop Assessment: no apparent nausea or vomiting Anesthetic complications: no   No notable events documented.  Last Vitals:  Vitals:   06/28/24 1000 06/28/24 1006  BP: (!) 142/76   Pulse: 64 62  Resp: 15 12  Temp:    SpO2: 97% 98%    Last Pain:  Vitals:   06/28/24 0954  TempSrc:   PainSc: 0-No pain                 Vertell Row

## 2024-06-28 NOTE — Anesthesia Procedure Notes (Addendum)
 Procedure Name: LMA Insertion Date/Time: 06/28/2024 9:03 AM  Performed by: Lenard Lacks, CRNAPre-anesthesia Checklist: Patient identified, Emergency Drugs available, Suction available and Patient being monitored Patient Re-evaluated:Patient Re-evaluated prior to induction Oxygen Delivery Method: Circle system utilized Preoxygenation: Pre-oxygenation with 100% oxygen Induction Type: IV induction LMA: LMA with gastric port inserted LMA Size: 5.0 Number of attempts: 1 Placement Confirmation: positive ETCO2 and breath sounds checked- equal and bilateral Dental Injury: Teeth and Oropharynx as per pre-operative assessment  Comments: Regular LMA would not seat properly so Proseal LMA used

## 2024-06-29 ENCOUNTER — Encounter (HOSPITAL_BASED_OUTPATIENT_CLINIC_OR_DEPARTMENT_OTHER): Payer: Self-pay | Admitting: Surgery

## 2024-06-29 LAB — SURGICAL PATHOLOGY

## 2024-07-03 ENCOUNTER — Ambulatory Visit: Attending: Physician Assistant | Admitting: Physician Assistant

## 2024-07-03 VITALS — BP 148/70 | HR 53 | Ht 73.0 in

## 2024-07-03 DIAGNOSIS — I480 Paroxysmal atrial fibrillation: Secondary | ICD-10-CM | POA: Diagnosis not present

## 2024-07-03 DIAGNOSIS — Z79899 Other long term (current) drug therapy: Secondary | ICD-10-CM

## 2024-07-03 DIAGNOSIS — Z5181 Encounter for therapeutic drug level monitoring: Secondary | ICD-10-CM | POA: Diagnosis not present

## 2024-07-03 DIAGNOSIS — I428 Other cardiomyopathies: Secondary | ICD-10-CM

## 2024-07-03 DIAGNOSIS — I2510 Atherosclerotic heart disease of native coronary artery without angina pectoris: Secondary | ICD-10-CM

## 2024-07-03 DIAGNOSIS — I10 Essential (primary) hypertension: Secondary | ICD-10-CM

## 2024-07-03 NOTE — Patient Instructions (Signed)
 Medication Instructions:    Your physician recommends that you continue on your current medications as directed. Please refer to the Current Medication list given to you today.   *If you need a refill on your cardiac medications before your next appointment, please call your pharmacy*   Lab Work:  NONE ORDERED  TODAY     If you have labs (blood work) drawn today and your tests are completely normal, you will receive your results only by: MyChart Message (if you have MyChart) OR A paper copy in the mail If you have any lab test that is abnormal or we need to change your treatment, we will call you to review the results.     Testing/Procedures: NONE ORDERED  TODAY    Follow-Up: At [facility], you and your health needs are our priority.  As part of our continuing mission to provide you with exceptional heart care, our providers are all part of one team.  This team includes your primary Cardiologist (physician) and Advanced Practice Providers or APPs (Physician Assistants and Nurse Practitioners) who all work together to provide you with the care you need, when you need it.   Your next appointment:  NONE ORDERED  TODAY    4 month(s)  Provider:  You may see one of the following Advanced Practice Providers on your designated Care Team:    [name], PA-C ( CONTACT  CASSIE HALL/ ANGELINE HAMMER FOR EP SCHEDULING ISSUES )   We recommend signing up for the patient portal called MyChart.  Sign up information is provided on this After Visit Summary.  MyChart is used to connect with patients for Virtual Visits (Telemedicine).  Patients are able to view lab/test results, encounter notes, upcoming appointments, etc.  Non-urgent messages can be sent to your provider as well.   To learn more about what you can do with MyChart, go to [email or web address].   Other Instructions

## 2024-07-10 NOTE — Progress Notes (Unsigned)
 Date:  07/17/2024   ID:  Lamar DELENA [place], DOB [birth year], MRN [id number]  PCP:  [name] ORN, MD  Cardiologist:  Tennille Montelongo MD Electrophysiologist:  Lynwood Rakers MD  Chief Complaint: CHF   History of Present Illness:    Christopher Navarro is a 73 y.o. male who is seen for follow up Afib and CHF.   He has a history of WPW and underwent an ablation in 1997 by Dr. Fernande. He was seen on Jul 20, 2018 with new onset AFib.  He was anticoagulated. Echo showed global HK with EF 40-45%.  He was seen in the Afib clinic and after anticoagulation he underwent DCCV on 08/15/18. He unfortunately had early return of Afib.  He has a history of NIDDM, HLD, and HTN. He also has a family history of CAD with a brother having CABG.   He subsequently underwent [facility] on 09/15/18. 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 Mildly elevated LV filling pressures and pulmonary HTN. Low cardiac output. His Coreg  dose was increased. He was switched from lisinopril  to Entresto  and was also started on Jardiance . He was seen by the Afib clinic and admitted for Tikosyn  loading and he had DCCV on 10/20/18. QT remained normal. Subsequent follow up Echo in July 2020 was normal.   In 2021  he developed recurrent Afib despite Tikosyn . He was significantly symptomatic with marked fatigue. He underwent Afib ablation by Dr Rakers on 03/26/20.   He had follow up with EP-Dr Cindie. Felt to be doing well on Tikosyn  and therapy continued.   When seen this past Spring had increased Afib episodes. Underwent Afib ablation on March 28, 2024. Still maintained on Tikosyn . CT suggested he had a LA thrombus but TEE showed no thrombus and normal LV function. Mild to mod MR. Coronary calcium  score on CT was 1147.   On follow up today he is doing well. No chest pain, dyspnea or edema. Weight is stable.  Afib is improved. He stays active doing yard work and going to [employer] Involved with a CERT team. No chest pain.     Prior CV studies:   The  following studies were reviewed today:  Echo 07/25/18: Study Conclusions   - Left ventricle: The cavity size was normal. There was mild   concentric hypertrophy. Systolic function was mildly to   moderately reduced. The estimated ejection fraction was in the   range of 40% to 45%. Diffuse hypokinesis. Features are consistent   with a pseudonormal left ventricular filling pattern, with   concomitant abnormal relaxation and increased filling pressure   (grade 2 diastolic dysfunction). Doppler parameters are   consistent with elevated ventricular end-diastolic filling   pressure. - Mitral valve: There was mild regurgitation. - Left atrium: The atrium was moderately dilated. - Right ventricle: The cavity size was normal. Wall thickness was   normal. Systolic function was normal. - Right atrium: The atrium was normal in size. - Tricuspid valve: There was no regurgitation. - Pericardium, extracardiac: There was no pericardial effusion.   Impressions:   - No prior study available for comparison.   Cardiac cath 09/25/18:  RIGHT/LEFT HEART CATH AND CORONARY ANGIOGRAPHY  Conclusion      Ost 1st Diag lesion is 99% stenosed. Ost Cx to Dist Cx lesion is 25% stenosed. There is moderate left ventricular systolic dysfunction. The left ventricular ejection fraction is 35-45% by visual estimate. LV end diastolic pressure is mildly elevated. Hemodynamic findings consistent with mild pulmonary hypertension.  1. Left dominant circulation 2. 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 Global LV dysfunction. EF 40%. 4. Mildly elevated LV filling pressures 5. Mild pulmonary venous HTN 6. Low cardiac output.    Plan: will optimize therapy for CHF. Increase Coreg  dose to 25 mg bid as Afib rate is not well controlled. Consider switching lisinopril  to Entresto . Consider adding Jardiance  for DM. Once CHF medications optimized will plan Tikosyn  load and conversion to NSR. OK to  resume Eliquis  in am.     Echo 02/22/19:  IMPRESSIONS     1. The left ventricle has normal systolic function with an ejection  fraction of 60-65%. The cavity size was normal. Left ventricular diastolic  Doppler parameters are consistent with impaired relaxation.   2. The right ventricle has normal systolic function. The cavity was  normal. There is no increase in right ventricular wall thickness.   3. The aortic valve is tricuspid. Mild thickening of the aortic valve.  Mild calcification of the aortic valve.   10/02/2020: TTE IMPRESSIONS   1. Left ventricular ejection fraction, by estimation, is 55 to 60%. The  left ventricle has normal function. The left ventricle has no regional  wall motion abnormalities.   2. E/A ratio >2.2 which would be consistent with restrictive filling,  however, tissue dopplers normal and no other parameters suggestive of  restrictive process. Suspect impaired relaxation without restriction.   3. Right ventricular systolic function is normal. The right ventricular  size is normal.   4. The mitral valve is normal in structure. Trivial mitral valve  regurgitation.   5. The aortic valve is tricuspid. Aortic valve regurgitation is not  visualized. No aortic stenosis is present.   6. The inferior vena cava is normal in size with greater than 50%  respiratory variability, suggesting right atrial pressure of 3 mmHg.       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   Diabetes mellitus type II    Dyslipidemia    FASCIITIS, PLANTAR 06/20/2009   HYPERTENSION 06/20/2009   Past Surgical History:  Procedure Laterality Date   ablasion  1997   ATRIAL FIBRILLATION ABLATION N/A 03/26/2020   Procedure: ATRIAL FIBRILLATION ABLATION;  Surgeon: [name] Agent, MD;  Location: [facility] INVASIVE CV LAB;  Service: Cardiovascular;  Laterality: N/A;   ATRIAL FIBRILLATION ABLATION N/A 03/28/2024   Procedure: ATRIAL  FIBRILLATION ABLATION;  Surgeon: Cindie Ole DASEN, MD;  Location: [facility] INVASIVE CV LAB;  Service: Cardiovascular;  Laterality: N/A;   BACK SURGERY  2006   CARDIOVERSION N/A 08/15/2018   Procedure: CARDIOVERSION;  Surgeon: Okey Vina GAILS, MD;  Location: [facility] ENDOSCOPY;  Service: Cardiovascular;  Laterality: N/A;   CARDIOVERSION N/A 10/20/2018   Procedure: CARDIOVERSION;  Surgeon: Lonni Slain, MD;  Location: [facility] ENDOSCOPY;  Service: Cardiovascular;  Laterality: N/A;   CARPAL TUNNEL RELEASE  2004   Bilateral   KNEE ARTHROSCOPY  2002   MASS EXCISION N/A 06/28/2024   Procedure: EXCISION, MASS, CHEST WALL;  Surgeon: Belinda Cough, MD;  Location: [facility];  Service: General;  Laterality: N/A;  EXCISION SEBACEOUS CYST ANSTERIOR CHEST WALL LMA   RIGHT/LEFT HEART CATH AND CORONARY ANGIOGRAPHY N/A 09/15/2018   Procedure: RIGHT/LEFT HEART CATH AND CORONARY ANGIOGRAPHY;  Surgeon: Zyah Gomm M, MD;  Location: [facility] INVASIVE CV LAB;  Service: Cardiovascular;  Laterality: N/A;   TRANSESOPHAGEAL ECHOCARDIOGRAM (CATH LAB) N/A 03/14/2024   Procedure: TRANSESOPHAGEAL  ECHOCARDIOGRAM;  Surgeon: Sheena Pugh, DO;  Location: [facility] INVASIVE CV LAB;  Service: Cardiovascular;  Laterality: N/A;     Current Meds  Medication Sig   acetaminophen  (TYLENOL ) 500 MG tablet Take 1,000 mg by mouth every 6 (six) hours as needed for mild pain or moderate pain.   carvedilol  (COREG ) 12.5 MG tablet TAKE 1 TABLET BY MOUTH TWICE A DAY   dofetilide  (TIKOSYN ) 500 MCG capsule TAKE 1 CAPSULE BY MOUTH TWICE A DAY   ELIQUIS  5 MG TABS tablet TAKE 1 TABLET BY MOUTH TWICE A DAY   JARDIANCE  10 MG TABS tablet TAKE 1 TABLET BY MOUTH EVERY DAY   Magnesium  250 MG TABS Take 250 mg by mouth 3 (three) times a week.   metFORMIN  (GLUCOPHAGE -XR) 750 MG 24 hr tablet Take 1 tablet (750 mg total) by mouth daily with breakfast.   Multiple Vitamin (MULTIVITAMIN WITH MINERALS) TABS tablet Take 1 tablet by mouth daily. Senior   pantoprazole   (PROTONIX ) 40 MG tablet Take 1 tablet (40 mg total) by mouth daily.   rosuvastatin  (CRESTOR ) 20 MG tablet Take 1 tablet (20 mg total) by mouth daily.   [DISCONTINUED] ENTRESTO  24-26 MG TAKE 1 TABLET BY MOUTH TWICE A DAY     Allergies:   Ciprofloxacin   Social History   Tobacco Use   Smoking status: Former   Smokeless tobacco: Never  Vaping Use   Vaping status: Never Used  Substance Use Topics   Alcohol use: No   Drug use: No     Family Hx: The patient's family history includes Arthritis in an other family member; Hyperlipidemia in an other family member; Hypertension in an other family member; Stroke in his mother.  ROS:   Please see the history of present illness.     All other systems reviewed and are negative.   Labs/Other Tests and Data Reviewed:    Recent Labs: 05/09/2024: ALT 16; Hemoglobin 16.2; Magnesium  1.9; Platelets 261.0 06/26/2024: BUN 13; Creatinine, Ser 0.84; Potassium 5.0; Sodium 138   Recent Lipid Panel Lab Results  Component Value Date/Time   CHOL 122 05/09/2024 10:53 AM   CHOL 117 09/18/2019 09:01 AM   TRIG 92.0 05/09/2024 10:53 AM   HDL 54.00 05/09/2024 10:53 AM   HDL 53 09/18/2019 09:01 AM   CHOLHDL 2 05/09/2024 10:53 AM   LDLCALC 50 05/09/2024 10:53 AM   LDLCALC 49 09/18/2019 09:01 AM    Wt Readings from Last 3 Encounters:  07/17/24 221 lb 9.6 oz (100.5 kg)  06/28/24 221 lb 12.5 oz (100.6 kg)  05/09/24 215 lb 6.4 oz (97.7 kg)    Objective:    Vital Signs:  BP 123/64   Pulse 70   Ht 6' 2 (1.88 m)   Wt 221 lb 9.6 oz (100.5 kg)   SpO2 98%   BMI 28.45 kg/m  per patient report  GENERAL:  Well appearing WM  In NAD HEENT:  PERRL, EOMI, sclera are clear. Oropharynx is clear. NECK:  No jugular venous distention, carotid upstroke brisk and symmetric, no bruits, no thyromegaly or adenopathy LUNGS:  Clear to auscultation bilaterally CHEST:  Unremarkable HEART:  RRR,  PMI not displaced or sustained,S1 and S2 within normal limits, no S3, no  S4: no clicks, no rubs, no murmurs ABD:  Soft, nontender. BS +, no masses or bruits. No hepatomegaly, no splenomegaly EXT:  2 + pulses throughout, no edema, no cyanosis no clubbing SKIN:  Warm and dry.  No rashes NEURO:  Alert and oriented x 3.  Cranial nerves II through XII intact. PSYCH:  Cognitively intact    ASSESSMENT & PLAN:    1.  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 Cardiac cath with nonobstructive CAD. Repeat Echo after restoration of sinus rhythm and optimization of medical therapy shows normalization of LV function. He is asymptomatic. Will continue Coreg , Entresto , Jardiance . By TEE EF is normal.   2. Atrial fibrillation paroxysmal and symptomatic. S/p DCCV with early return of AFib. Then placed on Tikosyn   and repeat DCCV performed on 10/20/18.  Had recurrent Afib and is now s/p ablation in August 2021 and again in August 2025. Now well controlled on Tikosyn   3. Remote history of WPW s/p ablation  4. HTN controlled  5. Hypercholesterolemia. Now on Crestor . At goal. LDL 50  6. DM type 2 on metformin  and Jardiance . Last A1c 7.3%. Follow up with PCP.  7. High coronary calcium  score. Cardiac cath in 2020 showed no obstructive disease. No angina. Focus on risk factor modification.    Medication Adjustments/Labs and Tests Ordered: Current medicines are reviewed at length with the patient today.  Concerns regarding medicines are outlined above.  Tests Ordered: none  Medication Changes: Meds ordered this encounter  Medications   sacubitril -valsartan  (ENTRESTO ) 24-26 MG    Sig: Take 1 tablet by mouth 2 (two) times daily.    Dispense:  180 tablet    Refill:  3     Disposition:  Follow up 6 months.  Signed, Libra Gatz, MD  07/17/2024 9:19 AM    [facility]

## 2024-07-17 ENCOUNTER — Encounter: Payer: Self-pay | Admitting: Cardiology

## 2024-07-17 ENCOUNTER — Ambulatory Visit: Attending: Cardiology | Admitting: Cardiology

## 2024-07-17 VITALS — BP 123/64 | HR 70 | Ht 74.0 in | Wt 221.6 lb

## 2024-07-17 DIAGNOSIS — I428 Other cardiomyopathies: Secondary | ICD-10-CM | POA: Diagnosis not present

## 2024-07-17 DIAGNOSIS — I10 Essential (primary) hypertension: Secondary | ICD-10-CM

## 2024-07-17 DIAGNOSIS — I480 Paroxysmal atrial fibrillation: Secondary | ICD-10-CM

## 2024-07-17 DIAGNOSIS — I2510 Atherosclerotic heart disease of native coronary artery without angina pectoris: Secondary | ICD-10-CM

## 2024-07-17 DIAGNOSIS — I5022 Chronic systolic (congestive) heart failure: Secondary | ICD-10-CM

## 2024-07-17 MED ORDER — SACUBITRIL-VALSARTAN 24-26 MG PO TABS: 1.0000 | ORAL_TABLET | Freq: Two times a day (BID) | ORAL | 3 refills | Status: AC

## 2024-07-17 NOTE — Patient Instructions (Signed)
 Medication Instructions:  Continue same medications *If you need a refill on your cardiac medications before your next appointment, please call your pharmacy*  Lab Work: None ordered  Testing/Procedures: None ordered  Follow-Up: At [name], you and your health needs are our priority.  As part of our continuing mission to provide you with exceptional heart care, our providers are all part of one team.  This team includes your primary Cardiologist (physician) and Advanced Practice Providers or APPs (Physician Assistants and Nurse Practitioners) who all work together to provide you with the care you need, when you need it.  Your next appointment:  6 months   Call in April to schedule June appointment     Provider:  Dr.Jordan    We recommend signing up for the patient portal called MyChart.  Sign up information is provided on this After Visit Summary.  MyChart is used to connect with patients for Virtual Visits (Telemedicine).  Patients are able to view lab/test results, encounter notes, upcoming appointments, etc.  Non-urgent messages can be sent to your provider as well.   To learn more about what you can do with MyChart, go to [email or web address].

## 2024-07-31 ENCOUNTER — Other Ambulatory Visit: Payer: Self-pay | Admitting: Cardiology

## 2024-08-30 ENCOUNTER — Other Ambulatory Visit: Payer: Self-pay | Admitting: Cardiology

## 2024-08-30 ENCOUNTER — Encounter: Payer: Self-pay | Admitting: Cardiology

## 2024-08-30 MED ORDER — CARVEDILOL 12.5 MG PO TABS: 12.5000 mg | ORAL_TABLET | Freq: Two times a day (BID) | ORAL | 3 refills | Status: AC

## 2024-10-31 ENCOUNTER — Ambulatory Visit: Admitting: Physician Assistant
